# Patient Record
Sex: Male | Born: 1935 | Race: White | Hispanic: No | Marital: Married | Smoking: Never smoker
Health system: Southern US, Community
[De-identification: ages and names within clinical notes are randomized; demographics above are authoritative.]

## PROBLEM LIST (undated history)

## (undated) DIAGNOSIS — I509 Heart failure, unspecified: Secondary | ICD-10-CM

## (undated) DIAGNOSIS — Z95810 Presence of automatic (implantable) cardiac defibrillator: Secondary | ICD-10-CM

## (undated) DIAGNOSIS — I2510 Atherosclerotic heart disease of native coronary artery without angina pectoris: Secondary | ICD-10-CM

## (undated) DIAGNOSIS — G629 Polyneuropathy, unspecified: Secondary | ICD-10-CM

## (undated) DIAGNOSIS — I252 Old myocardial infarction: Secondary | ICD-10-CM

## (undated) DIAGNOSIS — E119 Type 2 diabetes mellitus without complications: Secondary | ICD-10-CM

## (undated) DIAGNOSIS — G2581 Restless legs syndrome: Secondary | ICD-10-CM

## (undated) HISTORY — PX: APPENDECTOMY: SHX54

## (undated) HISTORY — PX: PARTIAL KNEE ARTHROPLASTY: SHX2174

---

## 2004-05-09 ENCOUNTER — Ambulatory Visit: Payer: Self-pay | Admitting: Ophthalmology

## 2004-05-11 ENCOUNTER — Ambulatory Visit: Payer: Self-pay | Admitting: Ophthalmology

## 2006-01-31 ENCOUNTER — Ambulatory Visit: Payer: Self-pay | Admitting: Gastroenterology

## 2007-12-23 ENCOUNTER — Ambulatory Visit: Payer: Self-pay | Admitting: Internal Medicine

## 2008-01-06 ENCOUNTER — Ambulatory Visit: Payer: Self-pay | Admitting: Internal Medicine

## 2008-04-30 ENCOUNTER — Ambulatory Visit: Payer: Self-pay | Admitting: Internal Medicine

## 2008-05-08 ENCOUNTER — Ambulatory Visit: Payer: Self-pay | Admitting: Internal Medicine

## 2008-10-21 ENCOUNTER — Ambulatory Visit: Payer: Self-pay | Admitting: Urology

## 2020-01-01 DIAGNOSIS — I252 Old myocardial infarction: Secondary | ICD-10-CM

## 2020-01-01 DIAGNOSIS — Z95810 Presence of automatic (implantable) cardiac defibrillator: Secondary | ICD-10-CM

## 2020-01-01 HISTORY — DX: Presence of automatic (implantable) cardiac defibrillator: Z95.810

## 2020-01-01 HISTORY — PX: CARDIAC DEFIBRILLATOR PLACEMENT: SHX171

## 2020-01-01 HISTORY — DX: Old myocardial infarction: I25.2

## 2020-07-06 DIAGNOSIS — I4891 Unspecified atrial fibrillation: Secondary | ICD-10-CM | POA: Insufficient documentation

## 2020-07-20 DIAGNOSIS — E782 Mixed hyperlipidemia: Secondary | ICD-10-CM | POA: Diagnosis present

## 2020-07-20 DIAGNOSIS — I6523 Occlusion and stenosis of bilateral carotid arteries: Secondary | ICD-10-CM | POA: Insufficient documentation

## 2020-07-20 DIAGNOSIS — I2510 Atherosclerotic heart disease of native coronary artery without angina pectoris: Secondary | ICD-10-CM | POA: Diagnosis present

## 2020-07-20 DIAGNOSIS — I5022 Chronic systolic (congestive) heart failure: Secondary | ICD-10-CM | POA: Diagnosis present

## 2020-08-17 ENCOUNTER — Other Ambulatory Visit: Payer: Self-pay

## 2020-08-17 ENCOUNTER — Encounter: Payer: Self-pay | Admitting: Emergency Medicine

## 2020-08-17 ENCOUNTER — Ambulatory Visit
Admission: EM | Admit: 2020-08-17 | Discharge: 2020-08-17 | Disposition: A | Discharge status: Home / Self Care | Payer: Medicare Other | Attending: Family Medicine | Admitting: Family Medicine

## 2020-08-17 DIAGNOSIS — L0231 Cutaneous abscess of buttock: Secondary | ICD-10-CM | POA: Diagnosis not present

## 2020-08-17 DIAGNOSIS — L03317 Cellulitis of buttock: Secondary | ICD-10-CM | POA: Diagnosis not present

## 2020-08-17 MED ORDER — DOXYCYCLINE HYCLATE 100 MG PO CAPS: 100.0000 mg | ORAL_CAPSULE | Freq: Two times a day (BID) | ORAL | 0 refills | Status: DC

## 2020-08-17 MED ORDER — MUPIROCIN 2 % EX OINT: 1.0000 "application " | TOPICAL_OINTMENT | Freq: Three times a day (TID) | CUTANEOUS | 0 refills | Status: AC

## 2020-08-17 NOTE — Discharge Instructions (Signed)
Warm soaks.  Medication as prescribed.  Take care  Dr. Adriana Simas

## 2020-08-17 NOTE — ED Provider Notes (Signed)
[facility]    CSN: [id number] Arrival date & time: 08/17/20  1403      History   Chief Complaint Chief Complaint  Patient presents with  . Abscess   HPI  87 year old male presents with an abscess on his buttock.  This is an ongoing issue.  It has been there for a few months.  Area is currently draining.  He states that it is painful.  His daughter-in-law has been applying topical agents to heal the area without resolution.  No fever.  No other associated symptoms.  No other complaints.  Home Medications    Prior to Admission medications   Medication Sig Start Date End Date Taking? Authorizing Provider  acetaminophen (TYLENOL) 500 MG tablet Take by mouth.   Yes [provider]  aspirin 81 MG EC tablet Take by mouth.   Yes [provider]  doxycycline (VIBRAMYCIN) 100 MG capsule Take 1 capsule (100 mg total) by mouth 2 (two) times daily. 08/17/20  Yes Tommie Sams, DO  finasteride (PROSCAR) 5 MG tablet Take by mouth. 06/23/20 06/23/21 Yes [provider]  gabapentin (NEURONTIN) 300 MG capsule TAKE 1 CAPSULE BY MOUTH TWICE DAILY, MORNING AND NIGHT 07/06/20  Yes [provider]  latanoprost (XALATAN) 0.005 % ophthalmic solution 1 drop nightly.   Yes [provider]  metFORMIN (GLUCOPHAGE) 500 MG tablet Take by mouth.   Yes [provider]  mupirocin ointment (BACTROBAN) 2 % Apply 1 application topically 3 (three) times daily for 7 days. 08/17/20 08/24/20 Yes Gianna Calef G, DO  nitrofurantoin, macrocrystal-monohydrate, (MACROBID) 100 MG capsule Take by mouth. 08/13/20 08/18/20 Yes [provider]  sacubitril-valsartan (ENTRESTO) 24-26 MG Take by mouth. 07/19/20 09/08/20 Yes [provider]  tamsulosin (FLOMAX) 0.4 MG CAPS capsule Take by mouth.   Yes [provider]    Family History No family history on file.  Social History Social History   Tobacco Use  . Smoking status: Never Smoker  .  Smokeless tobacco: Never Used  Vaping Use  . Vaping Use: Never used  Substance Use Topics  . Alcohol use: Not Currently  . Drug use: Not Currently     Allergies   Patient has no known allergies.   Review of Systems Review of Systems  Constitutional: Negative for fever.  Skin:       Abscess.     Physical Exam Triage Vital Signs ED Triage Vitals  Enc Vitals Group     BP 08/17/20 1423 108/68     Pulse Rate 08/17/20 1423 72     Resp 08/17/20 1423 18     Temp 08/17/20 1423 (!) 97.5 F (36.4 C)     Temp Source 08/17/20 1423 Oral     SpO2 08/17/20 1423 90 %     Weight 08/17/20 1419 170 lb (77.1 kg)     Height 08/17/20 1419 6' (1.829 m)     Head Circumference --      Peak Flow --      Pain Score 08/17/20 1419 5     Pain Loc --      Pain Edu? --      Excl. in GC? --    Updated Vital Signs BP 108/68 (BP Location: Left Arm)   Pulse 72   Temp (!) 97.5 F (36.4 C) (Oral)   Resp 18   Ht 6' (1.829 m)   Wt 77.1 kg   SpO2 90%   BMI 23.06 kg/m   Visual Acuity Right  Eye Distance:   Left Eye Distance:   Bilateral Distance:    Right Eye Near:   Left Eye Near:    Bilateral Near:     Physical Exam Vitals and nursing note reviewed.  Constitutional:      General: He is not in acute distress.    Appearance: Normal appearance. He is not ill-appearing.  HENT:     Head: Normocephalic and atraumatic.  Pulmonary:     Effort: Pulmonary effort is normal. No respiratory distress.  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  No significant fluctuance.  Neurological:     Mental Status: He is alert.  Psychiatric:        Mood and Affect: Mood normal.        Behavior: Behavior normal.    UC Treatments / Results  Labs (all labs ordered are listed, but only abnormal results are displayed) Labs Reviewed - No data to display  EKG   Radiology No results found.  Procedures Procedures (including critical care time)  Medications Ordered in  UC Medications - No data to display  Initial Impression / Assessment and Plan / UC Course  I have reviewed the triage vital signs and the nursing notes.  Pertinent labs & imaging results that were available during my care of the patient were reviewed by me and considered in my medical decision making (see chart for details).    58-year-old male presents with cellulitis and abscess.  The area is not significantly fluctuant.  I discussed incision and drainage but we elected to not proceed as I do not think it would be very fruitful.  Placed on doxycycline.  Bactroban ointment as prescribed.  Supportive care.  Final Clinical Impressions(s) / UC Diagnoses   Final diagnoses:  Abscess and cellulitis of gluteal region     Discharge Instructions     Warm soaks.  Medication as prescribed.  Take care  Dr. Adriana Simas    ED Prescriptions    Medication Sig Dispense Auth. Provider   doxycycline (VIBRAMYCIN) 100 MG capsule Take 1 capsule (100 mg total) by mouth 2 (two) times daily. 14 capsule Tatijana Bierly G, DO   mupirocin ointment (BACTROBAN) 2 % Apply 1 application topically 3 (three) times daily for 7 days. 30 g Tommie Sams, DO     PDMP not reviewed this encounter.   Tommie Sams, [place] 08/17/20 [place]

## 2020-08-17 NOTE — ED Triage Notes (Signed)
Pt states he has a abscess on his buttock. Has been there for months but daughter in law  Just notice it was infected last night.

## 2020-08-22 ENCOUNTER — Observation Stay
Admission: EM | Admit: 2020-08-22 | Discharge: 2020-08-24 | Disposition: A | Discharge status: Home / Self Care | Payer: Medicare Other | Attending: Internal Medicine | Admitting: Internal Medicine

## 2020-08-22 ENCOUNTER — Other Ambulatory Visit: Payer: Self-pay

## 2020-08-22 DIAGNOSIS — L89159 Pressure ulcer of sacral region, unspecified stage: Secondary | ICD-10-CM | POA: Diagnosis not present

## 2020-08-22 DIAGNOSIS — N400 Enlarged prostate without lower urinary tract symptoms: Secondary | ICD-10-CM

## 2020-08-22 DIAGNOSIS — Z20822 Contact with and (suspected) exposure to covid-19: Secondary | ICD-10-CM | POA: Insufficient documentation

## 2020-08-22 DIAGNOSIS — I2510 Atherosclerotic heart disease of native coronary artery without angina pectoris: Secondary | ICD-10-CM | POA: Diagnosis not present

## 2020-08-22 DIAGNOSIS — Z7984 Long term (current) use of oral hypoglycemic drugs: Secondary | ICD-10-CM | POA: Diagnosis not present

## 2020-08-22 DIAGNOSIS — E119 Type 2 diabetes mellitus without complications: Secondary | ICD-10-CM | POA: Diagnosis not present

## 2020-08-22 DIAGNOSIS — R55 Syncope and collapse: Secondary | ICD-10-CM | POA: Insufficient documentation

## 2020-08-22 DIAGNOSIS — E869 Volume depletion, unspecified: Secondary | ICD-10-CM | POA: Diagnosis not present

## 2020-08-22 DIAGNOSIS — Z7982 Long term (current) use of aspirin: Secondary | ICD-10-CM | POA: Insufficient documentation

## 2020-08-22 DIAGNOSIS — R748 Abnormal levels of other serum enzymes: Secondary | ICD-10-CM | POA: Insufficient documentation

## 2020-08-22 DIAGNOSIS — I5022 Chronic systolic (congestive) heart failure: Secondary | ICD-10-CM | POA: Diagnosis present

## 2020-08-22 DIAGNOSIS — I6529 Occlusion and stenosis of unspecified carotid artery: Secondary | ICD-10-CM

## 2020-08-22 DIAGNOSIS — Z95810 Presence of automatic (implantable) cardiac defibrillator: Secondary | ICD-10-CM

## 2020-08-22 DIAGNOSIS — S31000A Unspecified open wound of lower back and pelvis without penetration into retroperitoneum, initial encounter: Secondary | ICD-10-CM

## 2020-08-22 DIAGNOSIS — R778 Other specified abnormalities of plasma proteins: Secondary | ICD-10-CM | POA: Diagnosis present

## 2020-08-22 DIAGNOSIS — R339 Retention of urine, unspecified: Secondary | ICD-10-CM | POA: Diagnosis not present

## 2020-08-22 DIAGNOSIS — R42 Dizziness and giddiness: Principal | ICD-10-CM | POA: Insufficient documentation

## 2020-08-22 DIAGNOSIS — R531 Weakness: Secondary | ICD-10-CM | POA: Diagnosis present

## 2020-08-22 DIAGNOSIS — Z978 Presence of other specified devices: Secondary | ICD-10-CM

## 2020-08-22 DIAGNOSIS — E782 Mixed hyperlipidemia: Secondary | ICD-10-CM | POA: Diagnosis present

## 2020-08-22 HISTORY — DX: Type 2 diabetes mellitus without complications: E11.9

## 2020-08-22 HISTORY — DX: Heart failure, unspecified: I50.9

## 2020-08-22 HISTORY — DX: Old myocardial infarction: I25.2

## 2020-08-22 HISTORY — DX: Restless legs syndrome: G25.81

## 2020-08-22 HISTORY — DX: Presence of automatic (implantable) cardiac defibrillator: Z95.810

## 2020-08-22 HISTORY — DX: Polyneuropathy, unspecified: G62.9

## 2020-08-22 HISTORY — DX: Atherosclerotic heart disease of native coronary artery without angina pectoris: I25.10

## 2020-08-22 LAB — CBC WITH DIFFERENTIAL/PLATELET
Abs Immature Granulocytes: 0.08 10*3/uL — ABNORMAL HIGH (ref 0.00–0.07)
Basophils Absolute: 0.1 10*3/uL (ref 0.0–0.1)
Basophils Relative: 1 %
Eosinophils Absolute: 0.4 10*3/uL (ref 0.0–0.5)
Eosinophils Relative: 4 %
HCT: 37.4 % — ABNORMAL LOW (ref 39.0–52.0)
Hemoglobin: 11.9 g/dL — ABNORMAL LOW (ref 13.0–17.0)
Immature Granulocytes: 1 %
Lymphocytes Relative: 22 %
Lymphs Abs: 2.2 10*3/uL (ref 0.7–4.0)
MCH: 33.5 pg (ref 26.0–34.0)
MCHC: 31.8 g/dL (ref 30.0–36.0)
MCV: 105.4 fL — ABNORMAL HIGH (ref 80.0–100.0)
Monocytes Absolute: 1 10*3/uL (ref 0.1–1.0)
Monocytes Relative: 11 %
Neutro Abs: 5.9 10*3/uL (ref 1.7–7.7)
Neutrophils Relative %: 61 %
Platelets: 183 10*3/uL (ref 150–400)
RBC: 3.55 MIL/uL — ABNORMAL LOW (ref 4.22–5.81)
RDW: 13 % (ref 11.5–15.5)
WBC: 9.7 10*3/uL (ref 4.0–10.5)
nRBC: 0 % (ref 0.0–0.2)

## 2020-08-22 MED ORDER — SODIUM CHLORIDE 0.9 % IV BOLUS
500.0000 mL | Freq: Once | INTRAVENOUS | Status: AC
  Administered 2020-08-22: 500 mL via INTRAVENOUS

## 2020-08-22 NOTE — ED Triage Notes (Signed)
Pt presents to the [facility] via EMS from home with c/o generalized weakness and dizziness. Pt states that he began feeling weak and dizzy while bearing down to have a BM. Pt states that symptoms lasted for a short time before resolving. Pt currently A&Ox4 and denies any complaints at this time.

## 2020-08-23 ENCOUNTER — Inpatient Hospital Stay: Payer: Medicare Other

## 2020-08-23 ENCOUNTER — Inpatient Hospital Stay
Admit: 2020-08-23 | Discharge: 2020-08-23 | Disposition: A | Discharge status: Home / Self Care | Payer: Medicare Other | Attending: Internal Medicine | Admitting: Internal Medicine

## 2020-08-23 ENCOUNTER — Encounter: Payer: Self-pay | Admitting: Emergency Medicine

## 2020-08-23 DIAGNOSIS — E119 Type 2 diabetes mellitus without complications: Secondary | ICD-10-CM

## 2020-08-23 DIAGNOSIS — R55 Syncope and collapse: Secondary | ICD-10-CM | POA: Diagnosis not present

## 2020-08-23 DIAGNOSIS — R778 Other specified abnormalities of plasma proteins: Secondary | ICD-10-CM | POA: Diagnosis present

## 2020-08-23 DIAGNOSIS — L89159 Pressure ulcer of sacral region, unspecified stage: Secondary | ICD-10-CM

## 2020-08-23 DIAGNOSIS — R42 Dizziness and giddiness: Principal | ICD-10-CM

## 2020-08-23 DIAGNOSIS — I6529 Occlusion and stenosis of unspecified carotid artery: Secondary | ICD-10-CM

## 2020-08-23 DIAGNOSIS — N400 Enlarged prostate without lower urinary tract symptoms: Secondary | ICD-10-CM

## 2020-08-23 DIAGNOSIS — Z95810 Presence of automatic (implantable) cardiac defibrillator: Secondary | ICD-10-CM

## 2020-08-23 LAB — ECHOCARDIOGRAM COMPLETE
AR max vel: 2.27 cm2
AV Area VTI: 2.29 cm2
AV Area mean vel: 2.35 cm2
AV Mean grad: 6 mmHg
AV Peak grad: 11.3 mmHg
Ao pk vel: 1.68 m/s
Area-P 1/2: 3.11 cm2
Height: 72 in
MV VTI: 2.27 cm2
P 1/2 time: 590 msec
S' Lateral: 3.5 cm
Weight: 2720 oz

## 2020-08-23 LAB — CBC
HCT: 36 % — ABNORMAL LOW (ref 39.0–52.0)
Hemoglobin: 11.7 g/dL — ABNORMAL LOW (ref 13.0–17.0)
MCH: 33.9 pg (ref 26.0–34.0)
MCHC: 32.5 g/dL (ref 30.0–36.0)
MCV: 104.3 fL — ABNORMAL HIGH (ref 80.0–100.0)
Platelets: 180 10*3/uL (ref 150–400)
RBC: 3.45 MIL/uL — ABNORMAL LOW (ref 4.22–5.81)
RDW: 13.1 % (ref 11.5–15.5)
WBC: 10.1 10*3/uL (ref 4.0–10.5)
nRBC: 0 % (ref 0.0–0.2)

## 2020-08-23 LAB — BASIC METABOLIC PANEL
Anion gap: 7 (ref 5–15)
Anion gap: 8 (ref 5–15)
BUN: 36 mg/dL — ABNORMAL HIGH (ref 8–23)
BUN: 41 mg/dL — ABNORMAL HIGH (ref 8–23)
CO2: 22 mmol/L (ref 22–32)
CO2: 23 mmol/L (ref 22–32)
Calcium: 8.7 mg/dL — ABNORMAL LOW (ref 8.9–10.3)
Calcium: 8.9 mg/dL (ref 8.9–10.3)
Chloride: 107 mmol/L (ref 98–111)
Chloride: 108 mmol/L (ref 98–111)
Creatinine, Ser: 1.22 mg/dL (ref 0.61–1.24)
Creatinine, Ser: 1.48 mg/dL — ABNORMAL HIGH (ref 0.61–1.24)
GFR, Estimated: 45 mL/min — ABNORMAL LOW (ref >60)
GFR, Estimated: 56 mL/min — ABNORMAL LOW (ref >60)
Glucose, Bld: 146 mg/dL — ABNORMAL HIGH (ref 70–99)
Glucose, Bld: 176 mg/dL — ABNORMAL HIGH (ref 70–99)
Potassium: 4.5 mmol/L (ref 3.5–5.1)
Potassium: 4.9 mmol/L (ref 3.5–5.1)
Sodium: 137 mmol/L (ref 135–145)
Sodium: 138 mmol/L (ref 135–145)

## 2020-08-23 LAB — URINALYSIS, COMPLETE (UACMP) WITH MICROSCOPIC
Bacteria, UA: NONE SEEN
Bilirubin Urine: NEGATIVE
Glucose, UA: NEGATIVE mg/dL
Hgb urine dipstick: NEGATIVE
Ketones, ur: NEGATIVE mg/dL
Leukocytes,Ua: NEGATIVE
Nitrite: NEGATIVE
Protein, ur: NEGATIVE mg/dL
Specific Gravity, Urine: 1.016 (ref 1.005–1.030)
Squamous Epithelial / HPF: NONE SEEN (ref 0–5)
pH: 5 (ref 5.0–8.0)

## 2020-08-23 LAB — HEMOGLOBIN A1C
Hgb A1c MFr Bld: 7.6 % — ABNORMAL HIGH (ref 4.8–5.6)
Mean Plasma Glucose: 171.42 mg/dL

## 2020-08-23 LAB — CREATININE, SERUM
Creatinine, Ser: 1.37 mg/dL — ABNORMAL HIGH (ref 0.61–1.24)
GFR, Estimated: 49 mL/min — ABNORMAL LOW (ref >60)

## 2020-08-23 LAB — CBG MONITORING, ED
Glucose-Capillary: 140 mg/dL — ABNORMAL HIGH (ref 70–99)
Glucose-Capillary: 105 mg/dL — ABNORMAL HIGH (ref 70–99)

## 2020-08-23 LAB — TROPONIN I (HIGH SENSITIVITY)
Troponin I (High Sensitivity): 113 ng/L (ref <18)
Troponin I (High Sensitivity): 141 ng/L (ref <18)
Troponin I (High Sensitivity): 76 ng/L — ABNORMAL HIGH (ref <18)

## 2020-08-23 LAB — GLUCOSE, CAPILLARY
Glucose-Capillary: 170 mg/dL — ABNORMAL HIGH (ref 70–99)
Glucose-Capillary: 105 mg/dL — ABNORMAL HIGH (ref 70–99)

## 2020-08-23 LAB — RESP PANEL BY RT-PCR (FLU A&B, COVID) ARPGX2
Influenza A by PCR: NEGATIVE
Influenza B by PCR: NEGATIVE
SARS Coronavirus 2 by RT PCR: NEGATIVE

## 2020-08-23 LAB — MAGNESIUM
Magnesium: 1.9 mg/dL (ref 1.7–2.4)
Magnesium: 2 mg/dL (ref 1.7–2.4)

## 2020-08-23 LAB — BRAIN NATRIURETIC PEPTIDE: B Natriuretic Peptide: 533 pg/mL — ABNORMAL HIGH (ref 0.0–100.0)

## 2020-08-23 MED ORDER — SODIUM CHLORIDE 0.9 % IV SOLN: INTRAVENOUS | Status: DC

## 2020-08-23 MED ORDER — SODIUM CHLORIDE 0.9% FLUSH
3.0000 mL | Freq: Two times a day (BID) | INTRAVENOUS | Status: DC
  Administered 2020-08-23 – 2020-08-24 (×2): 3 mL via INTRAVENOUS

## 2020-08-23 MED ORDER — ACETAMINOPHEN 325 MG PO TABS: 650.0000 mg | ORAL_TABLET | Freq: Four times a day (QID) | ORAL | Status: DC | PRN

## 2020-08-23 MED ORDER — ONDANSETRON HCL 4 MG/2ML IJ SOLN: 4.0000 mg | Freq: Four times a day (QID) | INTRAMUSCULAR | Status: DC | PRN

## 2020-08-23 MED ORDER — ENOXAPARIN SODIUM 40 MG/0.4ML ~~LOC~~ SOLN
40.0000 mg | SUBCUTANEOUS | Status: DC
  Administered 2020-08-23 – 2020-08-24 (×2): 40 mg via SUBCUTANEOUS
  Filled 2020-08-23 (×2): qty 0.4

## 2020-08-23 MED ORDER — INSULIN ASPART 100 UNIT/ML ~~LOC~~ SOLN: 0.0000 [IU] | Freq: Every day | SUBCUTANEOUS | Status: DC

## 2020-08-23 MED ORDER — ONDANSETRON HCL 4 MG PO TABS: 4.0000 mg | ORAL_TABLET | Freq: Four times a day (QID) | ORAL | Status: DC | PRN

## 2020-08-23 MED ORDER — ACETAMINOPHEN 650 MG RE SUPP: 650.0000 mg | Freq: Four times a day (QID) | RECTAL | Status: DC | PRN

## 2020-08-23 MED ORDER — ASPIRIN 81 MG PO CHEW
324.0000 mg | CHEWABLE_TABLET | Freq: Once | ORAL | Status: AC
  Administered 2020-08-23: 324 mg via ORAL
  Filled 2020-08-23: qty 4

## 2020-08-23 MED ORDER — INSULIN ASPART 100 UNIT/ML ~~LOC~~ SOLN
0.0000 [IU] | Freq: Three times a day (TID) | SUBCUTANEOUS | Status: DC
  Administered 2020-08-23: 1 [IU] via SUBCUTANEOUS
  Administered 2020-08-23: 2 [IU] via SUBCUTANEOUS
  Filled 2020-08-23 (×2): qty 1

## 2020-08-23 NOTE — ED Notes (Signed)
Pt given phone to talk with son.

## 2020-08-23 NOTE — ED Notes (Signed)
Pt given warm blanket.

## 2020-08-23 NOTE — Progress Notes (Signed)
*  PRELIMINARY RESULTS* Echocardiogram 2D Echocardiogram has been performed.  [name] 08/23/2020, 2:34 PM

## 2020-08-23 NOTE — ED Notes (Signed)
Messaged floor rn via secure chat for bed assignment awaiting her response

## 2020-08-23 NOTE — ED Notes (Signed)
Pt given meal tray.

## 2020-08-23 NOTE — H&P (Signed)
History and Physical    [name]:[id number] DOB: [birth year] DOA: 08/22/2020  PCP: Patient, No Pcp Per   Patient coming from: Home  I have personally briefly reviewed patient's old medical records in [facility]  Chief Complaint: Weakness  HPI: [name] is a 87 y.o. male recently relocated from [place] with medical history significant for CAD, systolic heart failure status post AICD, HLD, DM, BPH with urinary retention with chronic indwelling Foley and carotid artery stenosis who presented to the emergency room with an episode of generalized weakness that occurred while bearing down to have a bowel movement.  The weakness was transient however his daughter-in-law who is a nurse checked his vital signs several hours later and found that his blood pressure was low and he seemed to be having some extra beats and recommended he be checked in the emergency room.  Patient at the present time feels like his usual self.  He denies chest pain, shortness of breath, palpitations or lightheadedness.  Denies nausea, vomiting or diaphoresis and denies abdominal pain or diarrhea.  Denies cough fever or chills.  ED Course: On arrival, afebrile, BP 90/60, pulse 78 O2 sat 94% on room air.  Blood work significant for hemoglobin of 11.9, creatinine of 1.48, baseline unknown.  Troponins with uptrend 76>113. EKG as reviewed by me : Sinus versus ectopic atrial rhythm with multiple PVCs and right BBB and nonspecific ST-T wave changes Imaging: Chest x-ray pending  Review of Systems: As per HPI otherwise all other systems on review of systems negative.    Past Medical History:  Diagnosis Date  . CAD (coronary artery disease)   . Cardiac defibrillator in place   . CHF (congestive heart failure) (HCC)   . Diabetes mellitus without complication (HCC)   . History of myocardial infarction   . Peripheral neuropathy   . Restless leg syndrome     Past Surgical History:  Procedure Laterality  Date  . APPENDECTOMY    . CARDIAC DEFIBRILLATOR PLACEMENT    . PARTIAL KNEE ARTHROPLASTY Left      reports that he has never smoked. He has never used smokeless tobacco. He reports previous alcohol use. He reports previous drug use.  No Known Allergies  History reviewed. No pertinent family history.    Prior to Admission medications   Medication Sig Start Date End Date Taking? Authorizing Provider  acetaminophen (TYLENOL) 500 MG tablet Take by mouth.    [provider]  aspirin 81 MG EC tablet Take by mouth.    [provider]  doxycycline (VIBRAMYCIN) 100 MG capsule Take 1 capsule (100 mg total) by mouth 2 (two) times daily. 08/17/20   Tommie Sams, DO  finasteride (PROSCAR) 5 MG tablet Take by mouth. 06/23/20 06/23/21  [provider]  gabapentin (NEURONTIN) 300 MG capsule TAKE 1 CAPSULE BY MOUTH TWICE DAILY, MORNING AND NIGHT 07/06/20   [provider]  latanoprost (XALATAN) 0.005 % ophthalmic solution 1 drop nightly.    [provider]  metFORMIN (GLUCOPHAGE) 500 MG tablet Take by mouth.    [provider]  mupirocin ointment (BACTROBAN) 2 % Apply 1 application topically 3 (three) times daily for 7 days. 08/17/20 08/24/20  Tommie Sams, DO  sacubitril-valsartan (ENTRESTO) 24-26 MG Take by mouth. 07/19/20 09/08/20  [provider]  tamsulosin (FLOMAX) 0.4 MG CAPS capsule Take by mouth.    [provider]    Physical Exam: Vitals:   08/23/20 7672 08/23/20 0150 08/23/20 0153  08/23/20 0235  BP: (!) 89/51 100/70 109/71 97/62  Pulse: 64 76 81 (!) 52  Resp: 17 (!) 25 18 15   Temp:      TempSrc:      SpO2: 97% 99% 95% 96%  Weight:      Height:         Vitals:   08/23/20 0148 08/23/20 0150 08/23/20 0153 08/23/20 0235  BP: (!) 89/51 100/70 109/71 97/62  Pulse: 64 76 81 (!) 52  Resp: 17 (!) 25 18 15   Temp:      TempSrc:      SpO2: 97% 99% 95% 96%  Weight:      Height:          Constitutional: Alert and  oriented x 3 . Not in any apparent distress HEENT:      Head: Normocephalic and atraumatic.         Eyes: PERLA, EOMI, Conjunctivae are normal. Sclera is non-icteric.       Mouth/Throat: Mucous membranes are moist.       Neck: Supple with no signs of meningismus. Cardiovascular: Regular rate and rhythm. No murmurs, gallops, or rubs. 2+ symmetrical distal pulses are present . No JVD. No LE edema Respiratory: Respiratory effort normal .Lungs sounds clear bilaterally. No wheezes, crackles, or rhonchi.  Gastrointestinal: Soft, non tender, and non distended with positive bowel sounds.  Genitourinary: No CVA tenderness. Musculoskeletal: Nontender with normal range of motion in all extremities. No cyanosis, or erythema of extremities. Neurologic:  Face is symmetric. Moving all extremities. No gross focal neurologic deficits . Skin:  Sacral wound with surrounding erythema psychiatric: Mood and affect are normal    Labs on Admission: I have personally reviewed following labs and imaging studies  CBC: Recent Labs  Lab 08/22/20 2345  WBC 9.7  NEUTROABS 5.9  HGB 11.9*  HCT 37.4*  MCV 105.4*  PLT 183   Basic Metabolic Panel: Recent Labs  Lab 08/22/20 2345  NA 138  K 4.5  CL 108  CO2 22  GLUCOSE 176*  BUN 41*  CREATININE 1.48*  CALCIUM 8.7*  MG 1.9   GFR: Estimated Creatinine Clearance: 36.2 mL/min (A) (by C-G formula based on SCr of 1.48 mg/dL (H)). Liver Function Tests: No results for input(s): AST, ALT, ALKPHOS, BILITOT, PROT, ALBUMIN in the last 168 hours. No results for input(s): LIPASE, AMYLASE in the last 168 hours. No results for input(s): AMMONIA in the last 168 hours. Coagulation Profile: No results for input(s): INR, PROTIME in the last 168 hours. Cardiac Enzymes: No results for input(s): CKTOTAL, CKMB, CKMBINDEX, TROPONINI in the last 168 hours. BNP (last 3 results) No results for input(s): PROBNP in the last 8760 hours. HbA1C: No results for input(s): HGBA1C in  the last 72 hours. CBG: No results for input(s): GLUCAP in the last 168 hours. Lipid Profile: No results for input(s): CHOL, HDL, LDLCALC, TRIG, CHOLHDL, LDLDIRECT in the last 72 hours. Thyroid Function Tests: No results for input(s): TSH, T4TOTAL, FREET4, T3FREE, THYROIDAB in the last 72 hours. Anemia Panel: No results for input(s): VITAMINB12, FOLATE, FERRITIN, TIBC, IRON, RETICCTPCT in the last 72 hours. Urine analysis: No results found for: COLORURINE, APPEARANCEUR, LABSPEC, PHURINE, GLUCOSEU, HGBUR, BILIRUBINUR, KETONESUR, PROTEINUR, UROBILINOGEN, NITRITE, LEUKOCYTESUR  Radiological Exams on Admission: No results found.   Assessment/Plan 87 year old male with history of CAD, systolic heart failure status post AICD, HLD, DM, BPH with urinary retention with chronic indwelling Foley and carotid artery stenosis presenting with an episode of generalized weakness that occurred  while bearing down to have a bowel movement.    Hypotension Postural dizziness with presyncope -Suspect orthostatic hypotension given low BP on arrival, possibly vasovagal -Gentle IV hydration as patient does have systolic heart failure -Monitor blood pressure    Elevated troponin -Troponin uptrending 76-113 -ED provider spoke with patient's cardiologist, Dr. Gwen Pounds who would like to continue to monitor, does not think it is NSTEMI at this time -Could be demand related to acute hypertensive episode above    Chronic systolic CHF (congestive heart failure), NYHA class 3 (HCC)   AICD (automatic cardioverter/defibrillator) present -Patient appears euvolemic -Will hold beta-blockers, Entresto given hypotension    Coronary artery disease involving native coronary artery of native heart   Hyperlipidemia, mixed -Continue aspirin and statin    Diabetes mellitus without complication (HCC) -Sliding scale insulin coverage    BPH (benign prostatic hyperplasia) Chronic urinary retention with indwelling  Foley -Check UA to evaluate for UTI -Continue prostate meds pending med rec    Carotid stenosis -Can consider carotid Doppler  Sacral decubitus ulcer -Wound care evaluation    DVT prophylaxis: Lovenox  Code Status: full code but does not want prolonged ventilator to sustain life  Family Communication:  none  Disposition Plan: Back to previous home environment Consults called: Cardiology Status:The patient will observe these symptoms, and report promptly any worsening or unexpected persistence.  If well, may return prn.     Andris Baumann MD [facility]     08/23/2020, 4:06 AM

## 2020-08-23 NOTE — Care Management Obs Status (Signed)
MEDICARE OBSERVATION STATUS NOTIFICATION   Patient Details  Name: [name] MRN: [id number] Date of Birth: 1929-05-10   Medicare Observation Status Notification Given:  Yes    Sumire Halbleib A Adrea Sherpa, LCSW 08/23/2020, 3:53 PM

## 2020-08-23 NOTE — ED Notes (Signed)
[place] at bedside. Pt pulled up in bed and repositioned at this time.

## 2020-08-23 NOTE — Care Management CC44 (Signed)
Condition Code 44 Documentation Completed  Patient Details  Name: NEVIN GRIZZLE MRN: [id number] Date of Birth: 06-Jul-1928   Condition Code 44 given:  Yes Patient signature on Condition Code 44 notice:  Yes Documentation of 2 MD's agreement:  Yes Code 44 added to claim:  Yes    Reuel Boom Tenya Araque, LCSW 08/23/2020, 3:53 PM

## 2020-08-23 NOTE — Evaluation (Signed)
Physical Therapy Evaluation Patient Details Name: [name] MRN: [id number] DOB: 1928/08/21 Today's Date: 08/23/2020   History of Present Illness  Pt admitted to [facility] on 08/22/20 for c/o generalized weakness that occurred while bearing down to have BM. Weakness was transient. DIL is RN and checked vitals to note that BP was low and that the pt HR seemed to have "extra beats." Pt with recent ED admission for chronic infected sacral decubitus ulcer. Significant PMH includes: CAD, sCHF post AICD, HLD, DM, BPH with urinary retention and chronic indwelling catheter, carotid artery stenosis. Carotid US concerning for extensive atheromatous plaque of the distal common internal carotid arteries. Elevated tropnin likely due to demand ischemia.    Clinical Impression  Pt is a 87 year old M admitted to hospital on 08/22/20 for generalized weakness. At baseline, pt is mod I for community ambulation with RW and ADL's with exception of pt needing assistance for drying off and donning clothes after bathing. Family assists with IADL's. Pt presents with adequate strength, balance, endurance, and safety awareness required for safe functional mobility. Pt was mod I for bed mobility and required supervision for safety for transfers and ambulation. 4-item DGI score of 8/12 indicates pt is at increased risk of falls (>/= 11/12 indicates low risk); however, score is likely due to the fact that pt required use of RW for mobility. Increased time required during session to measure orthostatics; see below for details. Pt is currently functioning at baseline and is therefore not an appropriate candidate for skilled acute PT needs at this time; please re-consult with any changes in status. Pt appropriate to ambulate with nursing and mobility tech while hospitalized for maintenance of Ind. At this time, PT recommends DC home with no further therapy needs.      Follow Up Recommendations No PT follow up    Equipment  Recommendations       Recommendations for Other Services       Precautions / Restrictions Precautions Precautions: Fall Restrictions Weight Bearing Restrictions: No      Mobility  Bed Mobility Overal bed mobility: Modified Independent             General bed mobility comments: Mod I for supine>sit transfer with HOB slightly elevated    Transfers Overall transfer level: Needs assistance Equipment used: Rolling walker (2 wheeled) Transfers: Sit to/from Stand Sit to Stand: Supervision         General transfer comment: Supervision for safety to perform STS with RW  Ambulation/Gait Ambulation/Gait assistance: Supervision Gait Distance (Feet): 160 Feet Assistive device: Rolling walker (2 wheeled)       General Gait Details: Supervision for safety to ambulate with RW. Pt demonstrates reciprocal gait pattern with wide BOS. Mild gait deficits noted with 4-item DGI score with score of 8/12 as pt required use of RW.      Balance Overall balance assessment: Needs assistance Sitting-balance support: No upper extremity supported;Feet supported Sitting balance-Leahy Scale: Normal     Standing balance support: Bilateral upper extremity supported;During functional activity Standing balance-Leahy Scale: Good Standing balance comment: No LOB with 4-item DGI testing                             Pertinent Vitals/Pain Pain Assessment: 0-10 Pain Score: 6  Pain Location: decubitus ulcer Pain Intervention(s): Monitored during session;Repositioned    Home Living Family/patient expects to be discharged to:: Private residence Living Arrangements: Children;Spouse/significant other Available Help at  Discharge: Family;Available 24 hours/day Type of Home: House Home Access: Stairs to enter Entrance Stairs-Rails: None Entrance Stairs-Number of Steps: 1 Home Layout: One level Home Equipment: Walker - 4 wheels;Bedside commode;Wheelchair - Government social research officer Comments: lift chair    Prior Function Level of Independence: Needs assistance   Gait / Transfers Assistance Needed: Mod I with rollator for community ambulation  ADL's / Homemaking Assistance Needed: Mod I for ADL's, except pt notes he requires assistance drying off and donning/doffing clothes after showering; pt does not need assist when donning/doffing clothes otherwise. Son/DIL assist with IADL's.        Hand Dominance        Extremity/Trunk Assessment   Upper Extremity Assessment Upper Extremity Assessment: Overall WFL for tasks assessed    Lower Extremity Assessment Lower Extremity Assessment: Overall WFL for tasks assessed    Cervical / Trunk Assessment Cervical / Trunk Assessment: Normal  Communication   Communication: No difficulties  Cognition Arousal/Alertness: Awake/alert Behavior During Therapy: WFL for tasks assessed/performed Overall Cognitive Status: Within Functional Limits for tasks assessed                                 General Comments: Pt A&O x4 and able to follow 100% of simple 2 step commands      General Comments      Exercises Other Exercises Other Exercises: Pt able to participate in bed mobility, transfers, and gait with RW. Pt required gross supervision for safety. Pt asymptomatic during mobility. 4-item DGI score of 8/12 indicates pt is at increased risk of falls, however, likely due to fact that pt required RW for mobility. Other Exercises: Pt educated regarding: PT role/POC, DC recommendations, BP testing, amb with nursing, and safety with mobility; he verbalized understanding of all education.   Assessment/Plan    PT Assessment Patent does not need any further PT services  PT Problem List         PT Treatment Interventions      PT Goals (Current goals can be found in the Care Plan section)  Acute Rehab PT Goals Patient Stated Goal: to walk and drive PT Goal Formulation: With patient Time For  Goal Achievement: 09/06/20 Potential to Achieve Goals: Good     AM-PAC PT "6 Clicks" Mobility  Outcome Measure Help needed turning from your back to your side while in a flat bed without using bedrails?: None Help needed moving from lying on your back to sitting on the side of a flat bed without using bedrails?: None Help needed moving to and from a bed to a chair (including a wheelchair)?: A Little Help needed standing up from a chair using your arms (e.g., wheelchair or bedside chair)?: A Little Help needed to walk in hospital room?: A Little Help needed climbing 3-5 steps with a railing? : A Little 6 Click Score: 20    End of Session Equipment Utilized During Treatment: Gait belt Activity Tolerance: Patient tolerated treatment well Patient left: in chair;with call bell/phone within reach Nurse Communication: Mobility status      Time: 1093-2355 PT Time Calculation (min) (ACUTE ONLY): 26 min   Charges:   PT Evaluation $PT Eval Low Complexity: 1 Low         Vira Blanco, PT, DPT 5:29 PM,08/23/20

## 2020-08-23 NOTE — ED Provider Notes (Signed)
[facility] Emergency Department Provider Note  ____________________________________________   Event Date/Time   First MD Initiated Contact with Patient 08/22/20 2328     (approximate)  I have reviewed the triage vital signs and the nursing notes.   HISTORY  Chief Complaint Weakness    HPI [name] is a 87 y.o. male with medical history as listed below who relatively recently relocated from [place] to [place] and is still in the process of establishing care.  He sees Dr. Gwen Pounds for cardiology and Dr. Maryjane Hurter for primary care and in fact has an appoint with Dr. Gwen Pounds later this week.  He presents tonight with an episode of generalized weakness earlier in the day.  He said that around 3 PM he was trying to have a bowel movement and was bearing down when he started feel very weak all over.  He felt better afterwards in particular after he took a nap but later in the evening he told his daughter-in-law about the episode.  She is a Engineer, civil (consulting) and she checked his vital signs and heart rate and discovered that his blood pressure was little bit low and he seemed to be having "extra beats".  He has a defibrillator and has a cardiac history so they felt like he should be checked out.   He said that he feels great.  He has no weakness or fatigue.  He reports that he is quite healthy for a 88 year old.  He has a Foley catheter in place but that is not a new or acute issue.  He denies fever, sore throat, chest pain, shortness of breath, palpitations, nausea, vomiting, and abdominal pain.  He said that the episode was acute in onset and the symptoms were moderate but nothing in particular made it feel better or worse and everything got better on its own.        Past Medical History:  Diagnosis Date  . CAD (coronary artery disease)   . Cardiac defibrillator in place   . CHF (congestive heart failure) (HCC)   . Diabetes mellitus without complication  (HCC)   . History of myocardial infarction   . Peripheral neuropathy   . Restless leg syndrome     Patient Active Problem List   Diagnosis Date Noted  . Elevated troponin 08/23/2020  . Diabetes mellitus without complication (HCC) 08/23/2020  . BPH (benign prostatic hyperplasia) 08/23/2020  . Postural dizziness with presyncope 08/23/2020  . AICD (automatic cardioverter/defibrillator) present 08/23/2020  . Carotid stenosis 08/23/2020  . Chronic systolic CHF (congestive heart failure), NYHA class 3 (HCC) 07/20/2020  . Coronary artery disease involving native coronary artery of native heart 07/20/2020  . Hyperlipidemia, mixed 07/20/2020  . Atrial fibrillation (HCC) 07/06/2020    Past Surgical History:  Procedure Laterality Date  . APPENDECTOMY    . CARDIAC DEFIBRILLATOR PLACEMENT    . PARTIAL KNEE ARTHROPLASTY Left     Prior to Admission medications   Medication Sig Start Date End Date Taking? Authorizing Provider  acetaminophen (TYLENOL) 500 MG tablet Take by mouth.    [provider]  aspirin 81 MG EC tablet Take by mouth.    [provider]  doxycycline (VIBRAMYCIN) 100 MG capsule Take 1 capsule (100 mg total) by mouth 2 (two) times daily. 08/17/20   Tommie Sams, DO  finasteride (PROSCAR) 5 MG tablet Take by mouth. 06/23/20 06/23/21  [provider]  gabapentin (NEURONTIN) 300 MG capsule TAKE 1 CAPSULE BY MOUTH TWICE DAILY, MORNING AND  NIGHT 07/06/20   [provider]  latanoprost (XALATAN) 0.005 % ophthalmic solution 1 drop nightly.    [provider]  metFORMIN (GLUCOPHAGE) 500 MG tablet Take by mouth.    [provider]  mupirocin ointment (BACTROBAN) 2 % Apply 1 application topically 3 (three) times daily for 7 days. 08/17/20 08/24/20  Tommie Samsook, Jayce G, DO  sacubitril-valsartan (ENTRESTO) 24-26 MG Take by mouth. 07/19/20 09/08/20  [provider]  tamsulosin (FLOMAX) 0.4 MG CAPS capsule Take by mouth.    [provider]    Allergies Patient has no known allergies.  History reviewed. No pertinent family history.  Social History Social History   Tobacco Use  . Smoking status: Never Smoker  . Smokeless tobacco: Never Used  Vaping Use  . Vaping Use: Never used  Substance Use Topics  . Alcohol use: Not Currently  . Drug use: Not Currently    Review of Systems Constitutional: No fever/chills Eyes: No visual changes. ENT: No sore throat. Cardiovascular: Transient episode of generalized weakness while having a bowel movement.  Resolved for multiple hours.  Denies chest pain and palpitations. Respiratory: Denies shortness of breath. Gastrointestinal: No abdominal pain.  No nausea, no vomiting.  No diarrhea.  No constipation. Genitourinary: Negative for dysuria. Musculoskeletal: Negative for neck pain.  Negative for back pain. Integumentary: Negative for rash. Neurological: Negative for headaches, focal weakness or numbness.   ____________________________________________   PHYSICAL EXAM:  VITAL SIGNS: ED Triage Vitals  Enc Vitals Group     BP 08/22/20 2326 90/60     Pulse Rate 08/22/20 2326 78     Resp 08/22/20 2326 18     Temp 08/22/20 2326 98 F (36.7 C)     Temp Source 08/22/20 2326 Oral     SpO2 08/22/20 2326 94 %     Weight 08/22/20 2327 77.1 kg (170 lb)     Height 08/22/20 2327 1.829 m (6')     Head Circumference --      Peak Flow --      Pain Score 08/22/20 2327 0     Pain Loc --      Pain Edu? --      Excl. in GC? --     Constitutional: Alert and oriented.  Eyes: Conjunctivae are normal.  Head: Atraumatic. Nose: No congestion/rhinnorhea. Mouth/Throat: Patient is wearing a mask. Neck: No stridor.  No meningeal signs.   Cardiovascular: Normal rate, regular rhythm. Good peripheral circulation. Respiratory: Normal respiratory effort.  No retractions. Gastrointestinal: Soft and nontender. No distention.  Musculoskeletal: No lower extremity tenderness nor  edema. No gross deformities of extremities. Neurologic:  Normal speech and language. No gross focal neurologic deficits are appreciated.  Skin:  Skin is warm and dry.  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  There is an extensive area of erythema and a small area with what appears to be an open but chronic wound, no purulent drainage, and no fluctuance that suggests that there would be benefit to I&D.  Minimal tenderness. Psychiatric: Mood and affect are normal. Speech and behavior are normal.  ____________________________________________   LABS (all labs ordered are listed, but only abnormal results are displayed)  Labs Reviewed  CBC WITH DIFFERENTIAL/PLATELET - Abnormal; Notable for the following components:      Result Value   RBC 3.55 (*)    Hemoglobin 11.9 (*)    HCT 37.4 (*)  MCV 105.4 (*)    Abs Immature Granulocytes 0.08 (*)    All other components within normal limits  BASIC METABOLIC PANEL - Abnormal; Notable for the following components:   Glucose, Bld 176 (*)    BUN 41 (*)    Creatinine, Ser 1.48 (*)    Calcium 8.7 (*)    GFR, Estimated 45 (*)    All other components within normal limits  TROPONIN I (HIGH SENSITIVITY) - Abnormal; Notable for the following components:   Troponin I (High Sensitivity) 76 (*)    All other components within normal limits  TROPONIN I (HIGH SENSITIVITY) - Abnormal; Notable for the following components:   Troponin I (High Sensitivity) 113 (*)    All other components within normal limits  MAGNESIUM  HEMOGLOBIN A1C  CBC  CREATININE, SERUM  TROPONIN I (HIGH SENSITIVITY)   ____________________________________________  EKG  ED ECG REPORT I, Loleta Rose, the attending physician, personally viewed and interpreted this ECG.  Date: 08/22/2020 EKG Time: 23: 35 Rate: 83 Rhythm: sinus or ectopic atrial rhythm with multiple PVCs QRS Axis: normal Intervals: Right  bundle branch block ST/T Wave abnormalities: Non-specific ST segment / T-wave changes, but no clear evidence of acute ischemia. Narrative Interpretation: no definitive evidence of acute ischemia; does not meet STEMI criteria.   ____________________________________________  RADIOLOGY I, Loleta Rose, personally viewed and evaluated these images (plain radiographs) as part of my medical decision making, as well as reviewing the written report by the radiologist.  ED MD interpretation: No indication for emergent imaging  Official radiology report(s): No results found.  ____________________________________________   PROCEDURES   Procedure(s) performed (including Critical Care):  .1-3 Lead EKG Interpretation Performed by: Loleta Rose, MD Authorized by: Loleta Rose, MD     ECG rate:  80   ECG rate assessment: normal     Rhythm: sinus rhythm     Ectopy: PAC and PVCs     Conduction: normal       ____________________________________________   INITIAL IMPRESSION / MDM / ASSESSMENT AND PLAN / ED COURSE  As part of my medical decision making, I reviewed the following data within the electronic MEDICAL RECORD NUMBER History obtained from family, Nursing notes reviewed and incorporated, Labs reviewed , EKG interpreted , Old chart reviewed, Discussed with cardiologist (Dr. Gwen Pounds), Discussed with admitting physician (Dr. Para March) and reviewed Notes from prior ED visits   Differential diagnosis includes, but is not limited to, volume depletion, vasovagal episode, ACS, acute infection.  The patient is asymptomatic.  His vital signs are notable for mild hypertension but he is asymptomatic from that as well and review of his historical data suggests his blood pressure is always on the low side.  He is not having chest pain or respiratory symptoms.  He most likely had a mild vasovagal episode when he was straining to have a bowel movement and he has been asymptomatic for hours.  However  given his age and comorbidities, I will evaluate with basic lab work.  His EKG shows no evidence of acute ischemia although he does have some premature contractions.  He has not had a defibrillator shock.  Patient is very alert, shock, and agrees with the plan.  He does not want to stay in the hospital and assuming a relatively reassuring work-up I do not think he will need to stay.  His sacral wound appears chronic.  I put in an ambulatory outpatient referral to the wound care center given that this is apparently been going on  for months but it does not appear that it would benefit from incision and drainage tonight and I doubt that it is the source of his acute issue.  He has a chronic indwelling Foley catheter but is having no urinary symptoms and no infectious signs or symptoms.  Given that the Foley is chronic I will not check a urinalysis given the possibility of a false positive and going down the road of unnecessary antibiotics.  The patient is on the cardiac monitor to evaluate for evidence of arrhythmia and/or significant heart rate changes.     Clinical Course as of 08/23/20 0410  Mon Aug 23, 2020  0006 CBC with Differential/Platelet(!) Essentially normal CBC [CF]  0030 Basic metabolic panel(!) Basic metabolic panel is essentially normal.  He has a degree of chronic kidney disease based on his labs in care everywhere and the troponin tonight is not significantly elevated above baseline, just a little bit.  He received the fluid bolus which also should be helpful.  CBC is normal as is the magnesium. [CF]  0138 Troponin I (High Sensitivity)(!): 76 High-sensitivity troponin is elevated at 76.  This is difficult to interpret in the setting of some mild kidney disease and the patient being completely asymptomatic.  His blood pressure is improved to 98/81 after his 500 mL fluid bolus.  He continues to state he feels fine and completely asymptomatic.  At no point has he had chest pain.  His  son is in the room currently.  I had a detailed discussion with the 2 of them about the troponin results, the relatively low blood pressure (although looking back through the historical data it appears his blood pressure is typically on the low side), and offered admission/observation versus repeat troponin with the potential for outpatient follow-up.  The patient strongly prefers discharge and outpatient follow-up if possible.  The current plan is to encourage him to eat and drink, check orthostatic vitals, and repeated troponin at just over the 2-hour mark.  If he continues to be asymptomatic with reassuring vital signs and no significant troponin changes, he may be appropriate for discharge and close follow-up with Dr. Gwen Pounds.  Both he and his son understand that I cannot definitively rule out an emergent process and that the troponin elevation is not normal even though it does not necessarily indicate an acute issue. [CF]  0251 Troponin I (High Sensitivity)(!!): 113 Unfortunately the patient's troponin has gone up to 113.  I do not believe that this represents an acute cardiac event that requires emergent intervention but it does warrant admission.  I discussed this with the patient and his son and they understand and agree.  I am giving a full dose aspirin but will hold off on heparin given that the patient is having no concerning EKG findings and no chest pain.  Consulting the hospitalist for admission. [CF]  8921 I discussed the case by phone with Dr. Gwen Pounds since he is the patient's cardiologist and is on-call overnight for [facility].  He agreed with my plan for a full dose aspirin and hospital admission and also agreed to hold off on the heparin for now unless something changes significantly from a clinical perspective. [CF]  0406 Discussed case by phone with Dr. Para March, including Dr. Philemon Kingdom recommendations, and she understands and agrees with the plan [CF]    Clinical Course User Index [CF]  Loleta Rose, MD     ____________________________________________  FINAL CLINICAL IMPRESSION(S) / ED DIAGNOSES  Final diagnoses:  Volume depletion  Elevated troponin level  Sacral wound, initial encounter  Indwelling Foley catheter present     MEDICATIONS GIVEN DURING THIS VISIT:  Medications  sodium chloride flush (NS) 0.9 % injection 3 mL (has no administration in time range)  insulin aspart (novoLOG) injection 0-9 Units (has no administration in time range)  insulin aspart (novoLOG) injection 0-5 Units (has no administration in time range)  enoxaparin (LOVENOX) injection 40 mg (has no administration in time range)  acetaminophen (TYLENOL) tablet 650 mg (has no administration in time range)    Or  acetaminophen (TYLENOL) suppository 650 mg (has no administration in time range)  ondansetron (ZOFRAN) tablet 4 mg (has no administration in time range)    Or  ondansetron (ZOFRAN) injection 4 mg (has no administration in time range)  sodium chloride 0.9 % bolus 500 mL (0 mLs Intravenous Stopped 08/23/20 0048)  aspirin chewable tablet 324 mg (324 mg Oral Given 08/23/20 0307)     ED Discharge Orders         Ordered    AMB referral to wound care center       Comments: Long term sacral wound, may benefit from specialty care   08/23/20 0247          *Please note:  SAVA PROBY was evaluated in Emergency Department on 08/23/2020 for the symptoms described in the history of present illness. He was evaluated in the context of the global COVID-19 pandemic, which necessitated consideration that the patient might be at risk for infection with the SARS-CoV-2 virus that causes COVID-19. Institutional protocols and algorithms that pertain to the evaluation of patients at risk for COVID-19 are in a state of rapid change based on information released by regulatory bodies including the CDC and federal and state organizations. These policies and algorithms were followed during the patient's  care in the ED.  Some ED evaluations and interventions may be delayed as a result of limited staffing during and after the pandemic.*  Note:  This document was prepared using Dragon voice recognition software and may include unintentional dictation errors.   Loleta Rose, MD 08/23/20 [phone]

## 2020-08-23 NOTE — Progress Notes (Addendum)
He feels better today.  He has no complaints.  No chest pain, dizziness or shortness of breath.  He said his blood pressure normally stays on the low side but not as low as what has been recorded in the hospital.  Carotid ultrasound concerning for extensive atheromatous plaque of the distal common internal carotid arteries.  He has CKD stage IIIa and he is at increased risk for contrast-induced acute kidney injury because of advanced age.  He cannot have MRA of the neck because of CKD and ICD status.  He decided against taking further evaluation of suspected internal carotid artery stenosis.  He said he would need severe stenosis was discovered, he is not willing to undergo any surgery or intervention because of advanced age.  Elevated troponins is probably due to demand ischemia.  Monitor closely in the hospital for another day.  PT evaluation.

## 2020-08-24 DIAGNOSIS — R42 Dizziness and giddiness: Secondary | ICD-10-CM | POA: Diagnosis not present

## 2020-08-24 DIAGNOSIS — R55 Syncope and collapse: Secondary | ICD-10-CM | POA: Diagnosis not present

## 2020-08-24 LAB — GLUCOSE, CAPILLARY
Glucose-Capillary: 123 mg/dL — ABNORMAL HIGH (ref 70–99)
Glucose-Capillary: 116 mg/dL — ABNORMAL HIGH (ref 70–99)

## 2020-08-24 MED ORDER — ACETAMINOPHEN 500 MG PO TABS: 500.0000 mg | ORAL_TABLET | Freq: Four times a day (QID) | ORAL | 0 refills | Status: DC | PRN

## 2020-08-24 NOTE — Plan of Care (Signed)
Pt ready for discharge IV and tele removed Educated on discharge planning.  Time allowed for questions and concerns Verbalizes an understanding on plan of care Awaiting patient transportation home   Problem: Education: Goal: Knowledge of General Education information will improve Description: Including pain rating scale, medication(s)/side effects and non-pharmacologic comfort measures Outcome: Adequate for Discharge   Problem: Health Behavior/Discharge Planning: Goal: Ability to manage health-related needs will improve Outcome: Adequate for Discharge   Problem: Clinical Measurements: Goal: Ability to maintain clinical measurements within normal limits will improve Outcome: Adequate for Discharge Goal: Will remain free from infection Outcome: Adequate for Discharge Goal: Diagnostic test results will improve Outcome: Adequate for Discharge Goal: Respiratory complications will improve Outcome: Adequate for Discharge Goal: Cardiovascular complication will be avoided Outcome: Adequate for Discharge   Problem: Activity: Goal: Risk for activity intolerance will decrease Outcome: Adequate for Discharge   Problem: Nutrition: Goal: Adequate nutrition will be maintained Outcome: Adequate for Discharge   Problem: Coping: Goal: Level of anxiety will decrease Outcome: Adequate for Discharge   Problem: Elimination: Goal: Will not experience complications related to bowel motility Outcome: Adequate for Discharge Goal: Will not experience complications related to urinary retention Outcome: Adequate for Discharge   Problem: Pain Managment: Goal: General experience of comfort will improve Outcome: Adequate for Discharge   Problem: Safety: Goal: Ability to remain free from injury will improve Outcome: Adequate for Discharge   Problem: Skin Integrity: Goal: Risk for impaired skin integrity will decrease Outcome: Adequate for Discharge

## 2020-08-24 NOTE — Discharge Summary (Signed)
Physician Discharge Summary  [name] YTK:[id number] DOB: [birth year] DOA: 08/22/2020  PCP: Patient, No Pcp Per  Admit date: 08/22/2020 Discharge date: 08/24/2020  Discharge disposition: Home   Recommendations for Outpatient Follow-Up:   Follow up with PCP in 1 week   Discharge Diagnosis:   Principal Problem:   Postural dizziness with presyncope Active Problems:   Elevated troponin   Chronic systolic CHF (congestive heart failure), NYHA class 3 (HCC)   Coronary artery disease involving native coronary artery of native heart   Hyperlipidemia, mixed   Diabetes mellitus without complication (HCC)   BPH (benign prostatic hyperplasia)   AICD (automatic cardioverter/defibrillator) present   Carotid stenosis   Sacral decubitus ulcer   Near syncope    Discharge Condition: Stable.  Diet recommendation:  Diet Order            Diet - low sodium heart healthy           Diet heart healthy/carb modified Room service appropriate? Yes; Fluid consistency: Thin  Diet effective now                   Code Status: Full Code     Hospital Course:   [name] is a 87 year old man with past medical history for CAD, chronic systolic CHF s/p AICD, CKD stage 3a,  hyperlipidemia, DM, BPH with urinary retention and chronic indwelling foley catheter and carotid stenosis. He presented to the emergency room because of generalized weakness and dizziness that occurred while straining during a bowel movement. Symptoms were transient. His daughter-in-law who is a nurse checked her BP and it was apparently low.  Troponin was slightly elevated but he did not have any chest pain or shortness of breath. Elevated troponin was likely due to demand ischemia. He was given IV fluids for hydration. He was able to ambulate with PT without any problems. He's deemed stable for discharge. Discharge plan was discussed with her son over the phone.       Discharge Exam:    Vitals:    08/23/20 1549 08/23/20 1959 08/24/20 0500 08/24/20 0732  BP: 95/63 122/60 112/67 107/67  Pulse: 72 65 65 65  Resp: 20 16 16 16   Temp: (!) 97.5 F (36.4 C) 98.3 F (36.8 C) 98.9 F (37.2 C) 98.8 F (37.1 C)  TempSrc: Oral Oral Oral Oral  SpO2: 100% 98% 98% 97%  Weight:   78.2 kg   Height:         GEN: NAD SKIN: 2 decubitus ulcers on the right buttock and 1 decubitus ulcer on the left buttock EYES: EOMI ENT: MMM CV: RRR PULM: CTA B ABD: soft, ND, NT, +BS CNS: AAO x 3, non focal EXT: No edema or tenderness   The results of significant diagnostics from this hospitalization (including imaging, microbiology, ancillary and laboratory) are listed below for reference.     Procedures and Diagnostic Studies:   Carotid Bilateral  Result Date: 08/23/2020 CLINICAL DATA:  Syncope Hypertension EXAM: BILATERAL CAROTID DUPLEX ULTRASOUND TECHNIQUE: 08/25/2020 scale imaging, color Doppler and duplex ultrasound were performed of bilateral carotid and vertebral arteries in the neck. COMPARISON:  None. FINDINGS: Criteria: Quantification of carotid stenosis is based on velocity parameters that correlate the residual internal carotid diameter with NASCET-based stenosis levels, using the diameter of the distal internal carotid lumen as the denominator for stenosis measurement. The following velocity measurements were obtained: RIGHT ICA: 124/20 cm/sec CCA: 75/12 cm/sec SYSTOLIC ICA/CCA RATIO:  1.7 ECA:  235 cm/sec LEFT ICA: 79/19 cm/sec CCA: 117/12 cm/sec SYSTOLIC ICA/CCA RATIO:  0.7 ECA: 170 cm/sec RIGHT CAROTID ARTERY: Multifocal heterogeneous atheromatous plaque of the distal common and proximal internal carotid arteries. RIGHT VERTEBRAL ARTERY:  Antegrade flow. LEFT CAROTID ARTERY: Multifocal heterogeneous atheromatous plaque of the distal common and proximal internal carotid arteries. LEFT VERTEBRAL ARTERY:  Antegrade flow. IMPRESSION: Extensive atheromatous plaque of the distal common internal carotid  arteries. Although velocities of the internal carotid arteries are within normal limits, presence of shadowing plaque limits evaluation. Further evaluation with CT angiography of the neck should be considered for better characterization of degree of stenosis. Electronically Signed   By: Acquanetta Belling M.D.   On: 08/23/2020 10:42   DG Chest Port 1 View  Result Date: 08/23/2020 CLINICAL DATA:  Near syncope EXAM: PORTABLE CHEST 1 VIEW COMPARISON:  None. FINDINGS: Patchy bilateral pulmonary opacity. A similar pattern was not reported on outside chest x-ray 07/09/2020. Cardiomegaly with dual-chamber ICD/pacer leads. No visible effusion or pneumothorax. IMPRESSION: Patchy bilateral pulmonary infiltrate primarily worrisome for pneumonia. Cardiomegaly. Electronically Signed   By: Marnee Spring M.D.   On: 08/23/2020 05:06   ECHOCARDIOGRAM COMPLETE  Result Date: 08/23/2020    ECHOCARDIOGRAM REPORT   Patient Name:   [name] Date of Exam: 08/23/2020 Medical Rec #:  [id number]         Height:       72.0 in Accession #:    [id number]        Weight:       170.0 lb Date of Birth:  25-Apr-1929          BSA:          1.988 m Patient Age:    87 years          BP:           115/70 mmHg Patient Gender: M                 HR:           71 bpm. Exam Location:  [facility] Procedure: 2D Echo, Color Doppler and Cardiac Doppler Indications:     R55 Syncope  History:         Patient has no prior history of Echocardiogram examinations.                  CHF, Previous Myocardial Infarction and CAD, Defibrillator;                  Risk Factors:Diabetes.  Sonographer:     Humphrey Rolls RDCS (AE) Referring Phys:  [id number] Andris Baumann Diagnosing Phys: Harold Hedge MD  Sonographer Comments: Global longitudinal strain was attempted. IMPRESSIONS  1. Left ventricular ejection fraction, by estimation, is 40 to 45%. The left ventricle has mildly decreased function. The left ventricle demonstrates regional wall motion abnormalities (see scoring  diagram/findings for description). Left ventricular diastolic parameters were normal.  2. Right ventricular systolic function is normal. The right ventricular size is normal.  3. The mitral valve is grossly normal. Trivial mitral valve regurgitation.  4. The aortic valve is tricuspid. Aortic valve regurgitation is mild. FINDINGS  Left Ventricle: Left ventricular ejection fraction, by estimation, is 40 to 45%. The left ventricle has mildly decreased function. The left ventricle demonstrates regional wall motion abnormalities. The left ventricular internal cavity size was normal in size. There is no left ventricular hypertrophy. Left ventricular diastolic parameters were normal.  LV Wall Scoring: The mid and distal  anterior septum, apical lateral segment, mid inferoseptal segment, apical inferior segment, and apex are hypokinetic. The entire anterior wall, antero-lateral wall, inferior wall, posterior wall, basal anteroseptal segment, and basal inferoseptal segment are normal. Right Ventricle: The right ventricular size is normal. No increase in right ventricular wall thickness. Right ventricular systolic function is normal. Left Atrium: Left atrial size was normal in size. Right Atrium: Right atrial size was normal in size. Pericardium: There is no evidence of pericardial effusion. Mitral Valve: The mitral valve is grossly normal. Trivial mitral valve regurgitation. MV peak gradient, 4.8 mmHg. The mean mitral valve gradient is 3.0 mmHg. Tricuspid Valve: The tricuspid valve is not well visualized. Tricuspid valve regurgitation is trivial. Aortic Valve: The aortic valve is tricuspid. Aortic valve regurgitation is mild. Aortic regurgitation PHT measures 590 msec. Aortic valve mean gradient measures 6.0 mmHg. Aortic valve peak gradient measures 11.3 mmHg. Aortic valve area, by VTI measures 2.29 cm. Pulmonic Valve: The pulmonic valve was not well visualized. Pulmonic valve regurgitation is trivial. Aorta: The aortic root  is normal in size and structure. IAS/Shunts: The atrial septum is grossly normal.  LEFT VENTRICLE PLAX 2D LVIDd:         5.10 cm  Diastology LVIDs:         3.50 cm  LV e' medial:    5.22 cm/s LV PW:         1.20 cm  LV E/e' medial:  19.5 LV IVS:        0.90 cm  LV e' lateral:   5.22 cm/s LVOT diam:     2.10 cm  LV E/e' lateral: 19.5 LV SV:         79 LV SV Index:   40 LVOT Area:     3.46 cm  RIGHT VENTRICLE RV Basal diam:  2.80 cm LEFT ATRIUM             Index       RIGHT ATRIUM           Index LA diam:        3.30 cm 1.66 cm/m  RA Area:     17.40 cm LA Vol (A2C):   47.9 ml 24.09 ml/m RA Volume:   49.10 ml  24.69 ml/m LA Vol (A4C):   31.8 ml 15.99 ml/m LA Biplane Vol: 42.1 ml 21.17 ml/m  AORTIC VALVE                    PULMONIC VALVE AV Area (Vmax):    2.27 cm     PV Vmax:       1.19 m/s AV Area (Vmean):   2.35 cm     PV Vmean:      77.200 cm/s AV Area (VTI):     2.29 cm     PV VTI:        0.195 m AV Vmax:           168.00 cm/s  PV Peak grad:  5.7 mmHg AV Vmean:          119.000 cm/s PV Mean grad:  3.0 mmHg AV VTI:            0.344 m AV Peak Grad:      11.3 mmHg AV Mean Grad:      6.0 mmHg LVOT Vmax:         110.00 cm/s LVOT Vmean:        80.800 cm/s LVOT VTI:  0.227 m LVOT/AV VTI ratio: 0.66 AI PHT:            590 msec  AORTA Ao Root diam: 3.10 cm MITRAL VALVE                TRICUSPID VALVE MV Area (PHT): 3.11 cm     TR Peak grad:   39.9 mmHg MV Area VTI:   2.27 cm     TR Vmax:        316.00 cm/s MV Peak grad:  4.8 mmHg MV Mean grad:  3.0 mmHg     SHUNTS MV Vmax:       1.09 m/s     Systemic VTI:  0.23 m MV Vmean:      77.1 cm/s    Systemic Diam: 2.10 cm MV Decel Time: 244 msec MV E velocity: 102.00 cm/s MV A velocity: 82.70 cm/s MV E/A ratio:  1.23 Harold Hedge MD Electronically signed by Harold Hedge MD Signature Date/Time: 08/23/2020/4:51:52 PM    Final      Labs:   Basic Metabolic Panel: Recent Labs  Lab 08/22/20 2345 08/23/20 0446 08/23/20 2214  NA 138  --  137  K 4.5  --  4.9   CL 108  --  107  CO2 22  --  23  GLUCOSE 176*  --  146*  BUN 41*  --  36*  CREATININE 1.48* 1.37* 1.22  CALCIUM 8.7*  --  8.9  MG 1.9  --  2.0   GFR Estimated Creatinine Clearance: 44.2 mL/min (by C-G formula based on SCr of 1.22 mg/dL). Liver Function Tests: No results for input(s): AST, ALT, ALKPHOS, BILITOT, PROT, ALBUMIN in the last 168 hours. No results for input(s): LIPASE, AMYLASE in the last 168 hours. No results for input(s): AMMONIA in the last 168 hours. Coagulation profile No results for input(s): INR, PROTIME in the last 168 hours.  CBC: Recent Labs  Lab 08/22/20 2345 08/23/20 0446  WBC 9.7 10.1  NEUTROABS 5.9  --   HGB 11.9* 11.7*  HCT 37.4* 36.0*  MCV 105.4* 104.3*  PLT 183 180   Cardiac Enzymes: No results for input(s): CKTOTAL, CKMB, CKMBINDEX, TROPONINI in the last 168 hours. BNP: Invalid input(s): POCBNP CBG: Recent Labs  Lab 08/23/20 1151 08/23/20 1628 08/23/20 2109 08/24/20 0504 08/24/20 0741  GLUCAP 105* 170* 105* 123* 116*   D-Dimer No results for input(s): DDIMER in the last 72 hours. Hgb A1c Recent Labs    08/23/20 0446  HGBA1C 7.6*   Lipid Profile No results for input(s): CHOL, HDL, LDLCALC, TRIG, CHOLHDL, LDLDIRECT in the last 72 hours. Thyroid function studies No results for input(s): TSH, T4TOTAL, T3FREE, THYROIDAB in the last 72 hours.  Invalid input(s): FREET3 Anemia work up No results for input(s): VITAMINB12, FOLATE, FERRITIN, TIBC, IRON, RETICCTPCT in the last 72 hours. Microbiology Recent Results (from the past 240 hour(s))  Resp Panel by RT-PCR (Flu A&B, Covid) Urine, Catheterized     Status: None   Collection Time: 08/23/20  4:46 AM   Specimen: Urine, Catheterized; Nasopharyngeal(NP) swabs in vial transport medium  Result Value Ref Range Status   SARS Coronavirus 2 by RT PCR NEGATIVE NEGATIVE Final    Comment: (NOTE) SARS-CoV-2 target nucleic acids are NOT DETECTED.  The SARS-CoV-2 RNA is generally detectable  in upper respiratory specimens during the acute phase of infection. The lowest concentration of SARS-CoV-2 viral copies this assay can detect is 138 copies/mL. A negative result does not preclude SARS-Cov-2 infection and  should not be used as the sole basis for treatment or other patient management decisions. A negative result may occur with  improper specimen collection/handling, submission of specimen other than nasopharyngeal swab, presence of viral mutation(s) within the areas targeted by this assay, and inadequate number of viral copies(<138 copies/mL). A negative result must be combined with clinical observations, patient history, and epidemiological information. The expected result is Negative.  Fact Sheet for Patients:  BloggerCourse.comhttps://www.fda.gov/media/152166/download  Fact Sheet for Healthcare Providers:  SeriousBroker.ithttps://www.fda.gov/media/152162/download  This test is no t yet approved or cleared by the [place] FDA and  has been authorized for detection and/or diagnosis of SARS-CoV-2 by FDA under an Emergency Use Authorization (EUA). This EUA will remain  in effect (meaning this test can be used) for the duration of the COVID-19 declaration under Section 564(b)(1) of the Act, 21 U.S.C.section 360bbb-3(b)(1), unless the authorization is terminated  or revoked sooner.       Influenza A by PCR NEGATIVE NEGATIVE Final   Influenza B by PCR NEGATIVE NEGATIVE Final    Comment: (NOTE) The Xpert Xpress SARS-CoV-2/FLU/RSV plus assay is intended as an aid in the diagnosis of influenza from Nasopharyngeal swab specimens and should not be used as a sole basis for treatment. Nasal washings and aspirates are unacceptable for Xpert Xpress SARS-CoV-2/FLU/RSV testing.  Fact Sheet for Patients: BloggerCourse.comhttps://www.fda.gov/media/152166/download  Fact Sheet for Healthcare Providers: SeriousBroker.ithttps://www.fda.gov/media/152162/download  This test is not yet approved or cleared by the [place] FDA and has  been authorized for detection and/or diagnosis of SARS-CoV-2 by FDA under an Emergency Use Authorization (EUA). This EUA will remain in effect (meaning this test can be used) for the duration of the COVID-19 declaration under Section 564(b)(1) of the Act, 21 U.S.C. section 360bbb-3(b)(1), unless the authorization is terminated or revoked.  Performed at [facility], [address] [place], [place] [address]      Discharge Instructions:   Discharge Instructions    AMB referral to wound care center   Complete by: As directed    Long term sacral wound, may benefit from specialty care   Diet - low sodium heart healthy   Complete by: As directed    Discharge wound care:   Complete by: As directed    Follow up at the wound care clinic as scheduled on 08/26/2020   Increase activity slowly   Complete by: As directed      Allergies as of 08/24/2020   No Known Allergies     Medication List    STOP taking these medications   doxycycline 100 MG capsule Commonly known as: VIBRAMYCIN     TAKE these medications   acetaminophen 500 MG tablet Commonly known as: TYLENOL Take 1 tablet (500 mg total) by mouth every 6 (six) hours as needed. What changed:   how much to take  when to take this  reasons to take this   aspirin 81 MG EC tablet Take by mouth.   brimonidine 0.1 % Soln Commonly known as: ALPHAGAN P Place 1 drop into the left eye 2 (two) times daily.   Cholecalciferol 50 MCG (2000 UT) Caps Take 2,000 Units by mouth daily.   Coenzyme Q10 100 MG capsule Take 100 mg by mouth daily.   dorzolamide 2 % ophthalmic solution Commonly known as: TRUSOPT Place 1 drop into both eyes 2 (two) times daily.   finasteride 5 MG tablet Commonly known as: PROSCAR Take by mouth.   furosemide 40 MG tablet Commonly known as: LASIX Take 40  mg by mouth daily.   gabapentin 300 MG capsule Commonly known as: NEURONTIN Take 300 mg by mouth 2 (two) times daily.    latanoprost 0.005 % ophthalmic solution Commonly known as: XALATAN Place 1 drop into both eyes at bedtime.   metFORMIN 500 MG tablet Commonly known as: GLUCOPHAGE Take 500 mg by mouth daily with breakfast.   metoprolol succinate 25 MG 24 hr tablet Commonly known as: TOPROL-XL Take 25 mg by mouth daily.   Multi-Vitamin tablet Take 1 tablet by mouth daily.   mupirocin ointment 2 % Commonly known as: BACTROBAN Apply 1 application topically 3 (three) times daily for 7 days.   sacubitril-valsartan 24-26 MG Commonly known as: ENTRESTO Take by mouth.   tamsulosin 0.4 MG Caps capsule Commonly known as: FLOMAX Take by mouth.   triamcinolone ointment 0.5 % Commonly known as: KENALOG Apply 1 application topically 2 (two) times daily.            Discharge Care Instructions  (From admission, onward)         Start     Ordered   08/24/20 0000  Discharge wound care:       Comments: Follow up at the wound care clinic as scheduled on 08/26/2020   08/24/20 7829            Time coordinating discharge: 31 minutes  Signed:  Lurene Shadow  [facility] 08/24/2020, 9:26 AM   Pager on [email or web address]. If 7PM-7AM, please contact night-coverage at [email or web address]

## 2020-08-24 NOTE — Plan of Care (Signed)
  Problem: Education: Goal: Knowledge of General Education information will improve Description: Including pain rating scale, medication(s)/side effects and non-pharmacologic comfort measures Outcome: Progressing   Problem: Clinical Measurements: Goal: Ability to maintain clinical measurements within normal limits will improve Outcome: Progressing   Problem: Clinical Measurements: Goal: Will remain free from infection Outcome: Progressing   Problem: Clinical Measurements: Goal: Respiratory complications will improve Outcome: Progressing   Problem: Pain Managment: Goal: General experience of comfort will improve Outcome: Progressing

## 2020-08-26 ENCOUNTER — Other Ambulatory Visit: Payer: Self-pay

## 2020-08-26 ENCOUNTER — Encounter: Payer: Medicare Other | Attending: Physician Assistant | Admitting: Physician Assistant

## 2020-08-26 DIAGNOSIS — I5042 Chronic combined systolic (congestive) and diastolic (congestive) heart failure: Secondary | ICD-10-CM | POA: Insufficient documentation

## 2020-08-26 DIAGNOSIS — E119 Type 2 diabetes mellitus without complications: Secondary | ICD-10-CM | POA: Insufficient documentation

## 2020-08-26 DIAGNOSIS — I2510 Atherosclerotic heart disease of native coronary artery without angina pectoris: Secondary | ICD-10-CM | POA: Diagnosis not present

## 2020-08-26 DIAGNOSIS — L0231 Cutaneous abscess of buttock: Secondary | ICD-10-CM | POA: Diagnosis not present

## 2020-09-01 NOTE — Progress Notes (Signed)
[name], [name] ([id number]) Visit Report for 08/26/2020 Allergy List Details Patient Name: [name], [name]. Date of Service: 08/26/2020 1:00 PM Medical Record Number: [id number] Patient Account Number: [id number] Date of Birth/Sex: 07-Sep-1928 (87 y.o. M) Treating RN: [name] Primary Care [name]: [name] Other Clinician: Referring Carmine Carrozza: Hinda Kehr Treating [name]/Extender: [name] in Treatment: 0 Allergies Active Allergies No Known Allergies Allergy Notes Electronic Signature(s) Signed: 08/31/2020 4:52:45 PM By: Jeremy Coria RN Entered By: [name] on 08/26/2020 13:42:46 [name], [name] -------------------------------------------------------------------------------- Arrival Information Details Patient Name: [name], [name]. Date of Service: 08/26/2020 1:00 PM Medical Record Number: [id number] Patient Account Number: [id number] Date of Birth/Sex: 1929/01/28 (87 y.o. M) Treating RN: Dolan Amen Primary Care Malique Driskill: [name] Other Clinician: Referring Fletcher Rathbun: Hinda Kehr Treating Claiborne Stroble/Extender: Skipper Cliche in Treatment: 0 Visit Information Patient Arrived: Charlyn Minerva Time: 13:15 Accompanied By: son Transfer Assistance: None Patient Identification Verified: Yes Secondary Verification Process Completed: Yes Patient Requires Transmission-Based Precautions: No Patient Has Alerts: No Electronic Signature(s) Signed: 08/31/2020 4:52:45 PM By: Jeremy Coria RN Entered By: [name] on 08/26/2020 13:26:49 [name], [name] -------------------------------------------------------------------------------- Clinic Level of Care Assessment Details Patient Name: [name], [name]. Date of Service: 08/26/2020 1:00 PM Medical Record Number: [id number] Patient Account Number: [id number] Date of Birth/Sex: [birth year] (87 y.o. M) Treating RN: Dolan Amen Primary Care Torie Towle: [name] Other  Clinician: Referring Sabirin Baray: Hinda Kehr Treating Johann Santone/Extender: Skipper Cliche in Treatment: 0 Clinic Level of Care Assessment Items TOOL 4 Quantity Score X - Use when only an EandM is performed on FOLLOW-UP visit 1 0 ASSESSMENTS - Nursing Assessment / Reassessment X - Reassessment of Co-morbidities (includes updates in patient status) 1 10 X- 1 5 Reassessment of Adherence to Treatment Plan ASSESSMENTS - Wound and Skin Assessment / Reassessment _0  - Simple Wound Assessment / Reassessment - one wound 0 X- 3 5 Complex Wound Assessment / Reassessment - multiple wounds _1  - 0 Dermatologic / Skin Assessment (not related to wound area) ASSESSMENTS - Focused Assessment _2  - Circumferential Edema Measurements - multi extremities 0 _3  - 0 Nutritional Assessment / Counseling / Intervention _4  - 0 Lower Extremity Assessment (monofilament, tuning fork, pulses) _5  - 0 Peripheral Arterial Disease Assessment (using hand held doppler) ASSESSMENTS - Ostomy and/or Continence Assessment and Care _6  - Incontinence Assessment and Management 0 _7  - 0 Ostomy Care Assessment and Management (repouching, etc.) PROCESS - Coordination of Care X - Simple Patient / Family Education for ongoing care 1 15 _8  - 0 Complex (extensive) Patient / Family Education for ongoing care _9  - 0 Staff obtains Programmer, systems, Records, Test Results / Process Orders _10  - 0 Staff telephones HHA, Nursing Homes / Clarify orders / etc _11  - 0 Routine Transfer to another Facility (non-emergent condition) _12  - 0 Routine Hospital Admission (non-emergent condition) _13  - 0 New Admissions / Biomedical engineer / Ordering NPWT, Apligraf, etc. _14  - 0 Emergency Hospital Admission (emergent condition) X- 1 10 Simple Discharge Coordination _15  - 0 Complex (extensive) Discharge Coordination PROCESS - Special Needs _16  - Pediatric / Minor Patient Management 0 _17  - 0 Isolation Patient Management _18  - 0 Hearing / Language  / Visual special needs _19  - 0 Assessment of Community assistance (transportation, D/C planning, etc.) _20  - 0 Additional assistance / Altered mentation _21  - 0 Support Surface(s) Assessment (bed, cushion, seat, etc.) INTERVENTIONS - Wound Cleansing / Measurement [name], Jeremy J. ([id number]) _22  - 0 Simple Wound Cleansing - one wound X- 3 5 Complex  Wound Cleansing - multiple wounds X- 1 5 Wound Imaging (photographs - any number of wounds) _0  - 0 Wound Tracing (instead of photographs) _1  - 0 Simple Wound Measurement - one wound X- 3 5 Complex Wound Measurement - multiple wounds INTERVENTIONS - Wound Dressings _2  - Small Wound Dressing one or multiple wounds 0 X- 1 15 Medium Wound Dressing one or multiple wounds _3  - 0 Large Wound Dressing one or multiple wounds <JXBJYNWGNFAOZHYQ>_6<\/VHQIONGEXBMWUXLK>_4  - 0 Application of Medications - topical <MWNUUVOZDGUYQIHK>_7<\/QQVZDGLOVFIEPPIR>_5  - 0 Application of Medications - injection INTERVENTIONS - Miscellaneous _6  - External ear exam 0 _7  - 0 Specimen Collection (cultures, biopsies, blood, body fluids, etc.) _8  - 0 Specimen(s) / Culture(s) sent or taken to Lab for analysis _9  - 0 Patient Transfer (multiple staff / Civil Service fast streamer / Similar devices) _10  - 0 Simple Staple / Suture removal (25 or less) _11  - 0 Complex Staple / Suture removal (26 or more) _12  - 0 Hypo / Hyperglycemic Management (close monitor of Blood Glucose) _13  - 0 Ankle / Brachial Index (ABI) - do not check if billed separately X- 1 5 Vital Signs Has the patient been seen at the hospital within the last three years: Yes Total Score: 110 Level Of Care: New/Established - Level 3 Electronic Signature(s) Signed: 08/26/2020 4:52:24 PM By: [name], [name] RN Entered By: [name], [name] on 08/26/2020 14:28:06 [name] ([id number]) -------------------------------------------------------------------------------- Encounter Discharge Information Details Patient Name: [name], [name]. Date of Service: 08/26/2020 1:00  PM Medical Record Number: [id number] Patient Account Number: [id number] Date of Birth/Sex: 1929-01-20 (87 y.o. M) Treating RN: Dolan Amen Primary Care Sandon Yoho: [name] Other Clinician: Referring Marily Konczal: Hinda Kehr Treating Keirstin Musil/Extender: Skipper Cliche in Treatment: 0 Encounter Discharge Information Items Post Procedure Vitals Discharge Condition: Stable Temperature (F): 97.7 Ambulatory Status: Walker Pulse (bpm): 59 Discharge Destination: Home Respiratory Rate (breaths/min): 18 Transportation: Private Auto Blood Pressure (mmHg): 110/68 Accompanied By: son Schedule Follow-up Appointment: Yes Clinical Summary of Care: Electronic Signature(s) Signed: 08/26/2020 4:52:24 PM By: [name], [name] RN Entered By: [name], [name] on 08/26/2020 14:30:06 Hendrie, Shelda Altes ([id number]) -------------------------------------------------------------------------------- Lower Extremity Assessment Details Patient Name: [name], [name]. Date of Service: 08/26/2020 1:00 PM Medical Record Number: [id number] Patient Account Number: [id number] Date of Birth/Sex: 1929/05/12 (87 y.o. M) Treating RN: [name] Primary Care Isami Mehra: [name] Other Clinician: Referring Starsha Morning: Hinda Kehr Treating Breckon Reeves/Extender: Skipper Cliche in Treatment: 0 Electronic Signature(s) Signed: 08/31/2020 4:52:45 PM By: Jeremy Coria RN Entered By: [name] on 08/26/2020 13:41:50 Guardiola, Shelda Altes ([id number]) -------------------------------------------------------------------------------- Multi Wound Chart Details Patient Name: [name], [name]. Date of Service: 08/26/2020 1:00 PM Medical Record Number: [id number] Patient Account Number: [id number] Date of Birth/Sex: [birth year] (87 y.o. M) Treating RN: Dolan Amen Primary Care Cathern Tahir: [name] Other Clinician: Referring Nakiya Rallis: Hinda Kehr Treating Rashonda Warrior/Extender: Skipper Cliche in Treatment:  0 Vital Signs Height(in): 70 Pulse(bpm): 29 Weight(lbs): 170 Blood Pressure(mmHg): 110/68 Body Mass Index(BMI): 24 Temperature(F): 97.7 Respiratory Rate(breaths/min): 18 Photos: Wound Location: Right, Proximal Gluteus Left, Distal Gluteus Right Gluteus Wounding Event: Gradually Appeared Gradually Appeared Gradually Appeared Primary Etiology: Abscess Abscess Abscess Date Acquired: 08/03/2020 08/03/2020 08/03/2020 Wallace of Treatment: 0 0 0 Wound Status: Open Open Open Measurements L x W x D (cm) 0.7x0.5x0.3 1.7x0.8x0.2 0.3x0.2x0.1 Area (cm) : 0.275 1.068 0.047 Volume (cm) : 0.082 0.214 0.005 Classification: Full Thickness Without Exposed Full Thickness Without Exposed Full Thickness Without Exposed Support Structures Support Structures Support Structures Exudate Amount: Medium Medium Medium Exudate Type: Serosanguineous  Serosanguineous Serosanguineous Exudate Color: red, brown red, brown red, brown Granulation Amount: None Present (0%) None Present (0%) None Present (0%) Necrotic Amount: Large (67-100%) Large (67-100%) Large (67-100%) Exposed Structures: Fat Layer (Subcutaneous Tissue): Fat Layer (Subcutaneous Tissue): Fat Layer (Subcutaneous Tissue): Yes Yes Yes Fascia: No Fascia: No Fascia: No Tendon: No Tendon: No Tendon: No Muscle: No Muscle: No Muscle: No Joint: No Joint: No Joint: No Bone: No Bone: No Bone: No Epithelialization: None None None Treatment Notes Electronic Signature(s) Signed: 08/26/2020 4:52:24 PM By: [name], [name] RN Entered By: [name], [name] on 08/26/2020 14:13:46 Capano, Shelda Altes ([id number]) -------------------------------------------------------------------------------- Multi-Disciplinary Care Plan Details Patient Name: [name], [name]. Date of Service: 08/26/2020 1:00 PM Medical Record Number: [id number] Patient Account Number: [id number] Date of Birth/Sex: May 02, 1929 (87 y.o. M) Treating RN: Dolan Amen Primary  Care Rennae Ferraiolo: [name] Other Clinician: Referring Mataio Mele: Hinda Kehr Treating Santino Kinsella/Extender: Skipper Cliche in Treatment: 0 Active Inactive Orientation to the Wound Care Program Nursing Diagnoses: Knowledge deficit related to the wound healing center program Goals: Patient/caregiver will verbalize understanding of the [facility] Program Date Initiated: 08/26/2020 Target Resolution Date: 08/26/2020 Goal Status: Active Interventions: Provide education on orientation to the wound center Notes: Wound/Skin Impairment Nursing Diagnoses: Impaired tissue integrity Goals: Patient/caregiver will verbalize understanding of skin care regimen Date Initiated: 08/26/2020 Target Resolution Date: 08/26/2020 Goal Status: Active Ulcer/skin breakdown will have a volume reduction of 30% by week 4 Date Initiated: 08/26/2020 Target Resolution Date: 09/23/2020 Goal Status: Active Ulcer/skin breakdown will have a volume reduction of 50% by week 8 Date Initiated: 08/26/2020 Target Resolution Date: 10/24/2020 Goal Status: Active Ulcer/skin breakdown will have a volume reduction of 80% by week 12 Date Initiated: 08/26/2020 Target Resolution Date: 11/23/2020 Goal Status: Active Interventions: Assess patient/caregiver ability to obtain necessary supplies Assess patient/caregiver ability to perform ulcer/skin care regimen upon admission and as needed Assess ulceration(s) every visit Provide education on ulcer and skin care Treatment Activities: Skin care regimen initiated : 08/26/2020 Topical wound management initiated : 08/26/2020 Notes: Electronic Signature(s) Signed: 08/26/2020 4:52:24 PM By: [name], [name] RN Entered By: [name], [name] on 08/26/2020 14:13:27 Zawadzki, Shelda Altes ([id number]) -------------------------------------------------------------------------------- Pain Assessment Details Patient Name: [name], [name]. Date of Service: 08/26/2020 1:00  PM Medical Record Number: [id number] Patient Account Number: [id number] Date of Birth/Sex: September 29, 1928 (87 y.o. M) Treating RN: Dolan Amen Primary Care Gianelle Mccaul: [name] Other Clinician: Referring Zahriah Roes: Hinda Kehr Treating Chrissa Meetze/Extender: Skipper Cliche in Treatment: 0 Active Problems Location of Pain Severity and Description of Pain Patient Has Paino No Site Locations Pain Management and Medication Current Pain Management: Electronic Signature(s) Signed: 08/26/2020 4:52:24 PM By: [name], [name] RN Signed: 08/31/2020 4:52:45 PM By: Jeremy Coria RN Entered By: [name] on 08/26/2020 13:27:20 Champney, Shelda Altes ([id number]) -------------------------------------------------------------------------------- Patient/Caregiver Education Details Patient Name: [name], [name]. Date of Service: 08/26/2020 1:00 PM Medical Record Number: [id number] Patient Account Number: [id number] Date of Birth/Gender: June 25, 1929 (87 y.o. M) Treating RN: Dolan Amen Primary Care Physician: [name] Other Clinician: Referring Physician: Hinda Kehr Treating Physician/Extender: Skipper Cliche in Treatment: 0 Education Assessment Education Provided To: Patient Education Topics Provided Welcome To [facility]: Methods: Explain/Verbal Responses: State content correctly Wound/Skin Impairment: Methods: Explain/Verbal Responses: State content correctly Electronic Signature(s) Signed: 08/26/2020 4:52:24 PM By: [name], [name] RN Entered By: [name], [name] on 08/26/2020 14:29:16 Franklyn, Shelda Altes ([id number]) -------------------------------------------------------------------------------- Wound Assessment Details Patient Name: [name], [name]. Date of Service: 08/26/2020 1:00 PM Medical  Record Number: [id number] Patient Account Number: [id number] Date of Birth/Sex: 27-Dec-1928 (87 y.o. M) Treating RN: [name] Primary Care Aeden Matranga: [name] Other Clinician: Referring Glynn Yepes: Hinda Kehr Treating Xayvier Vallez/Extender: Skipper Cliche in Treatment: 0 Wound Status Wound Number: 1 Primary Etiology: Abscess Wound Location: Right, Proximal Gluteus Wound Status: Open Wounding Event: Gradually Appeared Date Acquired: 08/03/2020 Wallace Of Treatment: 0 Clustered Wound: No Photos Wound Measurements Length: (cm) 0.7 Width: (cm) 0.5 Depth: (cm) 0.3 Area: (cm) 0.275 Volume: (cm) 0.082 % Reduction in Area: % Reduction in Volume: Epithelialization: None Tunneling: No Undermining: No Wound Description Classification: Full Thickness Without Exposed Support Structures Exudate Amount: Medium Exudate Type: Serosanguineous Exudate Color: red, brown Foul Odor After Cleansing: No Slough/Fibrino Yes Wound Bed Granulation Amount: None Present (0%) Exposed Structure Necrotic Amount: Large (67-100%) Fascia Exposed: No Necrotic Quality: Adherent Slough Fat Layer (Subcutaneous Tissue) Exposed: Yes Tendon Exposed: No Muscle Exposed: No Joint Exposed: No Bone Exposed: No Treatment Notes Wound #1 (Gluteus) Wound Laterality: Right, Proximal Cleanser [place]ram Ancillary Kit - 15 Day Supply Discharge Instruction: Use supplies as instructed; Kit contains: (15) Saline Bullets; (15) 3x3 Gauze; 15 pr Gloves Normal Saline Discharge Instruction: Wash your hands with soap and water. Remove old dressing, discard into plastic bag and place into trash. Cleanse the wound with Normal Saline prior to applying a clean dressing using gauze sponges, not tissues or cotton balls. Do not CAMEO, SHEWELL. ([id number]) scrub or use excessive force. Pat dry using gauze sponges, not tissue or cotton balls. Peri-Wound Care Topical Primary Dressing Prisma 4.34 (in) Discharge Instruction: Moisten w/normal saline or sterile water; Cover wound as directed. Do not remove from wound bed. Gauze packing strip 1/4 (in) Discharge Instruction: [place] 1/4 in as instructed. Secondary Dressing Non-Adherent Pad 3x8 (in/in) Secured With Tegaderm Film 4x4 (in/in) Discharge Instruction: Apply to wound bed Compression Wrap Compression Stockings Add-Ons Electronic Signature(s) Signed: 08/31/2020 4:52:45 PM By: Jeremy Coria RN Entered By: [name] on 08/26/2020 13:38:27 Rastetter, Shelda Altes ([id number]) -------------------------------------------------------------------------------- Wound Assessment Details Patient Name: [name], AIKEY. Date of Service: 08/26/2020 1:00 PM Medical Record Number: [id number] Patient Account Number: [id number] Date of Birth/Sex: [birth year] (87 y.o. M) Treating RN: [name] Primary Care Tammala Weider: [name] Other Clinician: Referring Carmelia Tiner: Hinda Kehr Treating Jae Skeet/Extender: Skipper Cliche in Treatment: 0 Wound Status Wound Number: 2 Primary Etiology: Abscess Wound Location: Left, Distal Gluteus Wound Status: Open Wounding Event: Gradually Appeared Date Acquired: 08/03/2020 Wallace Of Treatment: 0 Clustered Wound: No Photos Wound Measurements Length: (cm) 1.7 Width: (cm) 0.8 Depth: (cm) 0.2 Area: (cm) 1.068 Volume: (cm) 0.214 % Reduction in Area: % Reduction in Volume: Epithelialization: None Tunneling: No Undermining: No Wound Description Classification: Full Thickness Without Exposed Support Structures Exudate Amount: Medium Exudate Type: Serosanguineous Exudate Color: red, brown Foul Odor After Cleansing: No Slough/Fibrino Yes Wound Bed Granulation Amount: None Present (0%) Exposed Structure Necrotic Amount: Large (67-100%) Fascia Exposed: No Necrotic Quality: Adherent Slough Fat Layer (Subcutaneous Tissue) Exposed: Yes Tendon Exposed: No Muscle Exposed: No Joint Exposed: No Bone Exposed: No Treatment Notes Wound #2 (Gluteus) Wound Laterality: Left, Distal Cleanser Normal Saline Discharge Instruction: Wash your hands with soap and water. Remove  old dressing, discard into plastic bag and place into trash. Cleanse the wound with Normal Saline prior to applying a clean dressing using gauze sponges, not tissues or cotton balls. Do not scrub or use excessive force. Pat dry using gauze sponges, not tissue or cotton balls. VONTRELL, PULLMAN ([id number])  Peri-Wound Care Topical Primary Dressing Prisma 4.34 (in) Discharge Instruction: Moisten w/normal saline or sterile water; Cover wound as directed. Do not remove from wound bed. Secondary Dressing Non-Adherent Pad 3x8 (in/in) Secured With Tegaderm Film 4x4 (in/in) Discharge Instruction: Apply to wound bed Compression Wrap Compression Stockings Add-Ons Electronic Signature(s) Signed: 08/31/2020 4:52:45 PM By: Jeremy Coria RN Entered By: [name] on 08/26/2020 13:40:11 Payes, Shelda Altes ([id number]) -------------------------------------------------------------------------------- Wound Assessment Details Patient Name: VONN, SLIGER. Date of Service: 08/26/2020 1:00 PM Medical Record Number: [id number] Patient Account Number: [id number] Date of Birth/Sex: [birth year] (87 y.o. M) Treating RN: [name] Primary Care Torri Michalski: [name] Other Clinician: Referring Kalix Meinecke: Hinda Kehr Treating Seamus Warehime/Extender: Skipper Cliche in Treatment: 0 Wound Status Wound Number: 3 Primary Etiology: Abscess Wound Location: Right Gluteus Wound Status: Open Wounding Event: Gradually Appeared Date Acquired: 08/03/2020 Wallace Of Treatment: 0 Clustered Wound: No Photos Wound Measurements Length: (cm) 0.3 Width: (cm) 0.2 Depth: (cm) 0.1 Area: (cm) 0.047 Volume: (cm) 0.005 % Reduction in Area: % Reduction in Volume: Epithelialization: None Tunneling: No Undermining: No Wound Description Classification: Full Thickness Without Exposed Support Structures Exudate Amount: Medium Exudate Type: Serosanguineous Exudate Color: red, brown Foul Odor After Cleansing:  No Slough/Fibrino Yes Wound Bed Granulation Amount: None Present (0%) Exposed Structure Necrotic Amount: Large (67-100%) Fascia Exposed: No Necrotic Quality: Adherent Slough Fat Layer (Subcutaneous Tissue) Exposed: Yes Tendon Exposed: No Muscle Exposed: No Joint Exposed: No Bone Exposed: No Treatment Notes Wound #3 (Gluteus) Wound Laterality: Right Cleanser Normal Saline Discharge Instruction: Wash your hands with soap and water. Remove old dressing, discard into plastic bag and place into trash. Cleanse the wound with Normal Saline prior to applying a clean dressing using gauze sponges, not tissues or cotton balls. Do not scrub or use excessive force. Pat dry using gauze sponges, not tissue or cotton balls. RYLEY, BACHTEL ([id number]) Peri-Wound Care Topical Primary Dressing Prisma 4.34 (in) Discharge Instruction: Moisten w/normal saline or sterile water; Cover wound as directed. Do not remove from wound bed. Secondary Dressing Non-Adherent Pad 3x8 (in/in) Secured With Tegaderm Film 4x4 (in/in) Discharge Instruction: Apply to wound bed Compression Wrap Compression Stockings Add-Ons Electronic Signature(s) Signed: 08/31/2020 4:52:45 PM By: Jeremy Coria RN Entered By: [name] on 08/26/2020 13:41:33 Tarlton, Shelda Altes ([id number]) -------------------------------------------------------------------------------- Vitals Details Patient Name: JERMICHAEL, BELMARES. Date of Service: 08/26/2020 1:00 PM Medical Record Number: [id number] Patient Account Number: [id number] Date of Birth/Sex: 03-16-1929 (87 y.o. M) Treating RN: Dolan Amen Primary Care Adasha Boehme: [name] Other Clinician: Referring Esmond Hinch: Hinda Kehr Treating Lylliana Kitamura/Extender: Skipper Cliche in Treatment: 0 Vital Signs Time Taken: 13:15 Temperature (F): 97.7 Height (in): 70 Pulse (bpm): 59 Source: Stated Respiratory Rate (breaths/min): 18 Weight (lbs): 170 Blood Pressure (mmHg):  110/68 Source: Stated Reference Range: 80 - 120 mg / dl Body Mass Index (BMI): 24.4 Electronic Signature(s) Signed: 08/31/2020 4:52:45 PM By: Jeremy Coria RN Entered By: [name] on 08/26/2020 13:27:30

## 2020-09-01 NOTE — Progress Notes (Signed)
KAVIR, SAVOCA ([id number]) Visit Report for 08/26/2020 Chief Complaint Document Details Patient Name: [name], [name]. Date of Service: 08/26/2020 1:00 PM Medical Record Number: [id number] Patient Account Number: [id number] Date of Birth/Sex: 01-10-1929 (87 y.o. M) Treating RN: Dolan Amen Primary Care Provider: Ezequiel Kayser Other Clinician: Referring Provider: Hinda Kehr Treating Provider/Extender: Skipper Cliche in Treatment: 0 Information Obtained from: Patient Chief Complaint Bilateral gluteal abscesses Electronic Signature(s) Signed: 08/26/2020 2:09:06 PM By: Worthy Keeler PA-C Entered By: Worthy Keeler on 08/26/2020 14:09:06 [name], [name] ([id number]) -------------------------------------------------------------------------------- Debridement Details Patient Name: [name], [name]. Date of Service: 08/26/2020 1:00 PM Medical Record Number: [id number] Patient Account Number: [id number] Date of Birth/Sex: [birth year] (87 y.o. M) Treating RN: Dolan Amen Primary Care Provider: Ezequiel Kayser Other Clinician: Referring Provider: Hinda Kehr Treating Provider/Extender: Skipper Cliche in Treatment: 0 Debridement Performed for Wound #1 Right,Proximal Gluteus Assessment: Performed By: Physician Tommie Sams., PA-C Debridement Type: Debridement Level of Consciousness (Pre- Awake and Alert procedure): Pre-procedure Verification/Time Out Yes - 14:12 Taken: Start Time: 14:12 Total Area Debrided (L x W): 0.7 (cm) x 0.5 (cm) = 0.35 (cm) Tissue and other material Viable, Non-Viable, Slough, Subcutaneous, Slough debrided: Level: Skin/Subcutaneous Tissue Debridement Description: Excisional Instrument: Curette Bleeding: Minimum Hemostasis Achieved: Pressure Response to Treatment: Procedure was tolerated well Level of Consciousness (Post- Awake and Alert procedure): Post Debridement Measurements of Total Wound Length: (cm) 0.7 Width: (cm)  0.5 Depth: (cm) 0.7 Volume: (cm) 0.192 Character of Wound/Ulcer Post Debridement: Stable Post Procedure Diagnosis Same as Pre-procedure Electronic Signature(s) Signed: 08/26/2020 4:52:24 PM By: Charlett Nose RN Signed: 08/26/2020 5:47:45 PM By: Worthy Keeler PA-C Entered By: [name], [name] on 08/26/2020 14:15:26 [name] ([id number]) -------------------------------------------------------------------------------- HPI Details Patient Name: [name], [name]. Date of Service: 08/26/2020 1:00 PM Medical Record Number: [id number] Patient Account Number: [id number] Date of Birth/Sex: [birth year] (87 y.o. M) Treating RN: Dolan Amen Primary Care Provider: Ezequiel Kayser Other Clinician: Referring Provider: Hinda Kehr Treating Provider/Extender: Skipper Cliche in Treatment: 0 History of Present Illness HPI Description: 08/26/2020 upon evaluation today patient presents for initial inspection here in our clinic concerning issues he is been having with his gluteal region. He has several abscesses 2 on the right 1 on the left that have been present since at least December. With that being said triamcinolone and mupirocin has been used currently along with Desitin unfortunately this just does not seem to be making the progress that they would like to see. Obviously I think that the patient is not really showing any signs of infection right now which is good he was on doxycycline as prescribed by urgent care until he was seen in the ER in hospital where they apparently stopped this. Patient does have diabetes. He does also have a history of coronary artery disease. Subsequently his troponin was elevated in the hospital that is part of the reason why they kept him for analysis while he was there. Otherwise the patient seems to be doing well and I think he has a good chance of getting this to heal with appropriate treatment. Electronic Signature(s) Signed: 08/26/2020  2:31:46 PM By: Worthy Keeler PA-C Entered By: Worthy Keeler on 08/26/2020 14:31:46 [name], [name] ([id number]) -------------------------------------------------------------------------------- Physical Exam Details Patient Name: [name], [name]. Date of Service: 08/26/2020 1:00 PM Medical Record Number: [id number] Patient Account Number: [id number] Date of Birth/Sex: [birth year] (87 y.o. M) Treating RN: Dolan Amen Primary Care Provider: Ezequiel Kayser Other Clinician: Referring Provider: Karma Greaser,  CORY Treating Provider/Extender: Jeri Cos Weeks in Treatment: 0 Constitutional sitting or standing blood pressure is within target range for patient.. pulse regular and within target range for patient.Marland Kitchen respirations regular, non- labored and within target range for patient.Marland Kitchen temperature within target range for patient.. Well-nourished and well-hydrated in no acute distress. Eyes conjunctiva clear no eyelid edema noted. pupils equal round and reactive to light and accommodation. Ears, Nose, Mouth, and Throat no gross abnormality of ear auricles or external auditory canals. normal hearing noted during conversation. mucus membranes moist. Respiratory normal breathing without difficulty. Psychiatric this patient is able to make decisions and demonstrates good insight into disease process. Alert and Oriented x 3. pleasant and cooperative. Notes Patient's wound again showed signs of some necrotic tissue I did perform sharp debridement in regard to the proximal right and largest of all the wounds. There was some depth to this post debridement especially and this appeared to be doing better nonetheless I am pleased with how that appears currently post debridement I still think we can need to try to pack this in order to allow it to heal in from the bottom outward. Patient and his son are in agreement with the plan. Fortunately the patient's daughter-in-law is actually a Equities trader and  can help as well with the dressing changes. Electronic Signature(s) Signed: 08/26/2020 2:32:40 PM By: Worthy Keeler PA-C Entered By: Worthy Keeler on 08/26/2020 14:32:40 Gartner, [name] ([id number]) -------------------------------------------------------------------------------- Physician Orders Details Patient Name: [name], [name]. Date of Service: 08/26/2020 1:00 PM Medical Record Number: [id number] Patient Account Number: [id number] Date of Birth/Sex: June 06, 1929 (87 y.o. M) Treating RN: Dolan Amen Primary Care Provider: Ezequiel Kayser Other Clinician: Referring Provider: Hinda Kehr Treating Provider/Extender: Skipper Cliche in Treatment: 0 Verbal / Phone Orders: No Diagnosis Coding ICD-10 Coding Code Description L02.31 Cutaneous abscess of buttock L98.412 Non-pressure chronic ulcer of buttock with fat layer exposed E11.622 Type 2 diabetes mellitus with other skin ulcer I25.10 Atherosclerotic heart disease of native coronary artery without angina pectoris I50.42 Chronic combined systolic (congestive) and diastolic (congestive) heart failure Follow-up Appointments Wound #1 Right,Proximal Gluteus o Return Appointment in 2 weeks. Wound #2 Left,Distal Gluteus o Return Appointment in 2 weeks. Wound #3 Right Gluteus o Return Appointment in 2 weeks. Off-Loading Wound #1 Right,Proximal Gluteus o Turn and reposition every 2 hours Wound #2 Left,Distal Gluteus o Turn and reposition every 2 hours Wound #3 Right Gluteus o Turn and reposition every 2 hours Wound Treatment Wound #1 - Gluteus Wound Laterality: Right, Proximal Cleanser: Byram Ancillary Kit - 15 Day Supply (DME) (Generic) 3 x Per Week/30 Days Discharge Instructions: Use supplies as instructed; Kit contains: (15) Saline Bullets; (15) 3x3 Gauze; 15 pr Gloves Cleanser: Normal Saline (DME) (Generic) 3 x Per Week/30 Days Discharge Instructions: Wash your hands with soap and water. Remove old  dressing, discard into plastic bag and place into trash. Cleanse the wound with Normal Saline prior to applying a clean dressing using gauze sponges, not tissues or cotton balls. Do not scrub or use excessive force. Pat dry using gauze sponges, not tissue or cotton balls. Primary Dressing: Prisma 4.34 (in) (DME) (Generic) 3 x Per Week/30 Days Discharge Instructions: Moisten w/normal saline or sterile water; Cover wound as directed. Do not remove from wound bed. Primary Dressing: Gauze packing strip 1/4 (in) (DME) (Generic) 3 x Per Week/30 Days Discharge Instructions: Apply Plain Packing Strip 1/4 in as instructed. Secondary Dressing: Non-Adherent Pad 3x8 (in/in) (DME) (Generic) 3 x Per Week/30 Days  Secured With: Tegaderm Film 4x4 (in/in) 3 x Per Week/30 Days Discharge Instructions: Apply to wound bed Wound #2 - Gluteus Wound Laterality: Left, Distal Cleanser: Normal Saline (DME) (Generic) 3 x Per Week/30 Days MASASHI, SNOWDON ([id number]) Discharge Instructions: Wash your hands with soap and water. Remove old dressing, discard into plastic bag and place into trash. Cleanse the wound with Normal Saline prior to applying a clean dressing using gauze sponges, not tissues or cotton balls. Do not scrub or use excessive force. Pat dry using gauze sponges, not tissue or cotton balls. Primary Dressing: Prisma 4.34 (in) (Generic) 3 x Per Week/30 Days Discharge Instructions: Moisten w/normal saline or sterile water; Cover wound as directed. Do not remove from wound bed. Secondary Dressing: Non-Adherent Pad 3x8 (in/in) (Generic) 3 x Per Week/30 Days Secured With: Tegaderm Film 4x4 (in/in) 3 x Per Week/30 Days Discharge Instructions: Apply to wound bed Wound #3 - Gluteus Wound Laterality: Right Cleanser: Normal Saline (Generic) 3 x Per Week/30 Days Discharge Instructions: Wash your hands with soap and water. Remove old dressing, discard into plastic bag and place into trash. Cleanse the wound with  Normal Saline prior to applying a clean dressing using gauze sponges, not tissues or cotton balls. Do not scrub or use excessive force. Pat dry using gauze sponges, not tissue or cotton balls. Primary Dressing: Prisma 4.34 (in) (Generic) 3 x Per Week/30 Days Discharge Instructions: Moisten w/normal saline or sterile water; Cover wound as directed. Do not remove from wound bed. Secondary Dressing: Non-Adherent Pad 3x8 (in/in) (DME) (Generic) 3 x Per Week/30 Days Secured With: Tegaderm Film 4x4 (in/in) 3 x Per Week/30 Days Discharge Instructions: Apply to wound bed Electronic Signature(s) Signed: 08/26/2020 4:52:24 PM By: [name], [name] RN Signed: 08/26/2020 5:47:45 PM By: Worthy Keeler PA-C Entered By: [name], [name] on 08/26/2020 14:27:00 [place], [name] ([id number]) -------------------------------------------------------------------------------- Problem List Details Patient Name: KADRIAN, PARTCH. Date of Service: 08/26/2020 1:00 PM Medical Record Number: [id number] Patient Account Number: [id number] Date of Birth/Sex: 1929/07/02 (87 y.o. M) Treating RN: Dolan Amen Primary Care Provider: Ezequiel Kayser Other Clinician: Referring Provider: Hinda Kehr Treating Provider/Extender: Skipper Cliche in Treatment: 0 Active Problems ICD-10 Encounter Code Description Active Date MDM Diagnosis L02.31 Cutaneous abscess of buttock 08/26/2020 No Yes L98.412 Non-pressure chronic ulcer of buttock with fat layer exposed 08/26/2020 No Yes E11.622 Type 2 diabetes mellitus with other skin ulcer 08/26/2020 No Yes I25.10 Atherosclerotic heart disease of native coronary artery without angina 08/26/2020 No Yes pectoris I50.42 Chronic combined systolic (congestive) and diastolic (congestive) heart 08/26/2020 No Yes failure Inactive Problems Resolved Problems Electronic Signature(s) Signed: 08/26/2020 2:08:37 PM By: Worthy Keeler PA-C Entered By: Worthy Keeler on 08/26/2020  14:08:37 [place], [name] ([id number]) -------------------------------------------------------------------------------- Progress Note Details Patient Name: Jeremy Longest. Date of Service: 08/26/2020 1:00 PM Medical Record Number: [id number] Patient Account Number: [id number] Date of Birth/Sex: September 05, 1928 (87 y.o. M) Treating RN: Dolan Amen Primary Care Provider: Ezequiel Kayser Other Clinician: Referring Provider: Hinda Kehr Treating Provider/Extender: Skipper Cliche in Treatment: 0 Subjective Chief Complaint Information obtained from Patient Bilateral gluteal abscesses History of Present Illness (HPI) 08/26/2020 upon evaluation today patient presents for initial inspection here in our clinic concerning issues he is been having with his gluteal region. He has several abscesses 2 on the right 1 on the left that have been present since at least December. With that being said triamcinolone and mupirocin has been used currently along with Desitin unfortunately this just does not  seem to be making the progress that they would like to see. Obviously I think that the patient is not really showing any signs of infection right now which is good he was on doxycycline as prescribed by urgent care until he was seen in the ER in hospital where they apparently stopped this. Patient does have diabetes. He does also have a history of coronary artery disease. Subsequently his troponin was elevated in the hospital that is part of the reason why they kept him for analysis while he was there. Otherwise the patient seems to be doing well and I think he has a good chance of getting this to heal with appropriate treatment. Patient History Allergies No Known Allergies Social History Never smoker, Marital Status - Married, Alcohol Use - Rarely, Drug Use - No History, Caffeine Use - Daily. Objective Constitutional sitting or standing blood pressure is within target range for patient.. pulse regular  and within target range for patient.Marland Kitchen respirations regular, non- labored and within target range for patient.Marland Kitchen temperature within target range for patient.. Well-nourished and well-hydrated in no acute distress. Vitals Time Taken: 1:15 PM, Height: 70 in, Source: Stated, Weight: 170 lbs, Source: Stated, BMI: 24.4, Temperature: 97.7 F, Pulse: 59 bpm, Respiratory Rate: 18 breaths/min, Blood Pressure: 110/68 mmHg. Eyes conjunctiva clear no eyelid edema noted. pupils equal round and reactive to light and accommodation. Ears, Nose, Mouth, and Throat no gross abnormality of ear auricles or external auditory canals. normal hearing noted during conversation. mucus membranes moist. Respiratory normal breathing without difficulty. Psychiatric this patient is able to make decisions and demonstrates good insight into disease process. Alert and Oriented x 3. pleasant and cooperative. General Notes: Patient's wound again showed signs of some necrotic tissue I did perform sharp debridement in regard to the proximal right and largest of all the wounds. There was some depth to this post debridement especially and this appeared to be doing better nonetheless I am pleased with how that appears currently post debridement I still think we can need to try to pack this in order to allow it to heal in from the bottom outward. Patient and his son are in agreement with the plan. Fortunately the patient's daughter-in-law is actually a Equities trader and can help as well with the dressing changes. Integumentary (Hair, Skin) Wound #1 status is Open. Original cause of wound was Gradually Appeared. The date acquired was: 08/03/2020. The wound is located on the [name], [name]. ([id number]) Right,Proximal Gluteus. The wound measures 0.7cm length x 0.5cm width x 0.3cm depth; 0.275cm^2 area and 0.082cm^3 volume. There is Fat Layer (Subcutaneous Tissue) exposed. There is no tunneling or undermining noted. There is a medium  amount of serosanguineous drainage noted. There is no granulation within the wound bed. There is a large (67-100%) amount of necrotic tissue within the wound bed including Adherent Slough. Wound #2 status is Open. Original cause of wound was Gradually Appeared. The date acquired was: 08/03/2020. The wound is located on the Left,Distal Gluteus. The wound measures 1.7cm length x 0.8cm width x 0.2cm depth; 1.068cm^2 area and 0.214cm^3 volume. There is Fat Layer (Subcutaneous Tissue) exposed. There is no tunneling or undermining noted. There is a medium amount of serosanguineous drainage noted. There is no granulation within the wound bed. There is a large (67-100%) amount of necrotic tissue within the wound bed including Adherent Slough. Wound #3 status is Open. Original cause of wound was Gradually Appeared. The date acquired was: 08/03/2020. The wound is located on the Right  Gluteus. The wound measures 0.3cm length x 0.2cm width x 0.1cm depth; 0.047cm^2 area and 0.005cm^3 volume. There is Fat Layer (Subcutaneous Tissue) exposed. There is no tunneling or undermining noted. There is a medium amount of serosanguineous drainage noted. There is no granulation within the wound bed. There is a large (67-100%) amount of necrotic tissue within the wound bed including Adherent Slough. 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 There was a Excisional Skin/Subcutaneous Tissue Debridement with a total area of 0.35 sq cm performed by Tommie Sams., PA-C. With the following instrument(s): Curette to remove Viable  and Non-Viable tissue/material. Material removed includes Subcutaneous Tissue and Slough and. A time out was conducted at 14:12, prior to the start of the procedure. A Minimum amount of bleeding was controlled with Pressure. The procedure was tolerated well. Post Debridement Measurements: 0.7cm length x 0.5cm width x 0.7cm depth; 0.192cm^3 volume. Character of Wound/Ulcer Post Debridement is stable. Post procedure Diagnosis Wound #1: Same as Pre-Procedure Plan Follow-up Appointments: Wound #1 Right,Proximal Gluteus: Return Appointment in 2 weeks. Wound #2 Left,Distal Gluteus: Return Appointment in 2 weeks. Wound #3 Right Gluteus: Return Appointment in 2 weeks. Off-Loading: Wound #1 Right,Proximal Gluteus: Turn and reposition every 2 hours Wound #2 Left,Distal Gluteus: Turn and reposition every 2 hours Wound #3 Right Gluteus: Turn and reposition every 2 hours WOUND #1: - Gluteus Wound Laterality: Right, Proximal Cleanser: Byram Ancillary Kit - 15 Day Supply (DME) (Generic) 3 x Per Week/30 Days Discharge Instructions: Use supplies as instructed; Kit contains: (15) Saline Bullets; (15) 3x3 Gauze; 15 pr Gloves Cleanser: Normal Saline (DME) (Generic) 3 x Per Week/30 Days Discharge Instructions: Wash your hands with soap and water. Remove old dressing, discard into plastic bag and place into trash. Cleanse the wound with Normal Saline prior to applying a clean dressing using gauze sponges, not tissues or cotton balls. Do not scrub or use excessive force. Pat dry using gauze sponges, not tissue or cotton balls. [name], [name] ([id number]) Primary Dressing: Prisma 4.34 (in) (DME) (Generic) 3 x Per Week/30 Days Discharge Instructions: Moisten w/normal saline or sterile water; Cover wound as directed. Do not remove from wound bed. Primary Dressing: Gauze packing strip 1/4 (in) (DME) (Generic) 3 x Per Week/30 Days Discharge Instructions: Apply Plain Packing Strip 1/4 in as  instructed. Secondary Dressing: Non-Adherent Pad 3x8 (in/in) (DME) (Generic) 3 x Per Week/30 Days Secured With: Tegaderm Film 4x4 (in/in) 3 x Per Week/30 Days Discharge Instructions: Apply to wound bed WOUND #2: - Gluteus Wound Laterality: Left, Distal Cleanser: Normal Saline (DME) (Generic) 3 x Per Week/30 Days Discharge Instructions: Wash your hands with soap and water. Remove old dressing, discard into plastic bag and place into trash. Cleanse the wound with Normal Saline prior to applying a clean dressing using gauze sponges, not tissues or cotton balls. Do not scrub or use excessive force. Pat dry using gauze sponges, not tissue or cotton balls. Primary Dressing: Prisma 4.34 (in) (Generic) 3 x Per Week/30 Days Discharge Instructions: Moisten w/normal saline or sterile water; Cover wound as directed. Do not remove from wound bed. Secondary Dressing: Non-Adherent Pad 3x8 (in/in) (Generic) 3 x Per Week/30 Days Secured With: Tegaderm Film 4x4 (  in/in) 3 x Per Week/30 Days Discharge Instructions: Apply to wound bed WOUND #3: - Gluteus Wound Laterality: Right Cleanser: Normal Saline (Generic) 3 x Per Week/30 Days Discharge Instructions: Wash your hands with soap and water. Remove old dressing, discard into plastic bag and place into trash. Cleanse the wound with Normal Saline prior to applying a clean dressing using gauze sponges, not tissues or cotton balls. Do not scrub or use excessive force. Pat dry using gauze sponges, not tissue or cotton balls. Primary Dressing: Prisma 4.34 (in) (Generic) 3 x Per Week/30 Days Discharge Instructions: Moisten w/normal saline or sterile water; Cover wound as directed. Do not remove from wound bed. Secondary Dressing: Non-Adherent Pad 3x8 (in/in) (DME) (Generic) 3 x Per Week/30 Days Secured With: Tegaderm Film 4x4 (in/in) 3 x Per Week/30 Days Discharge Instructions: Apply to wound bed 1. Would recommend currently that we have the patient go ahead and have  the patient initiate treatment with silver collagen to all wound locations. I think this is good to do a good job for him to be honest. 2. I am also going to recommend that we have him use a small plain packing strip into the deeper wound on the proximal right gluteal location. 3. I am also can recommend that we use a Tegaderm to cover here in the clinic and a Telfa pad underneath this and then subsequently have the patient use next care bandages at home so that he can shower with these in place as he does have to shower daily. 4. I am also can recommend that we have the patient offload appropriately the good news is it sounds like he does have a Roho cushion which is excellent I think those are very beneficial for the situation like he is experiencing at this point. We will see patient back for reevaluation in 1 week here in the clinic. If anything worsens or changes patient will contact our office for additional recommendations. Electronic Signature(s) Signed: 08/26/2020 3:21:42 PM By: Worthy Keeler PA-C Entered By: Worthy Keeler on 08/26/2020 15:21:41 [name], [name] ([id number]) -------------------------------------------------------------------------------- ROS/PFSH Details Patient Name: [name], [name]. Date of Service: 08/26/2020 1:00 PM Medical Record Number: [id number] Patient Account Number: [id number] Date of Birth/Sex: [birth year] (87 y.o. M) Treating RN: Carlene Coria Primary Care Provider: Ezequiel Kayser Other Clinician: Referring Provider: Hinda Kehr Treating Provider/Extender: Skipper Cliche in Treatment: 0 Immunizations Pneumococcal Vaccine: Received Pneumococcal Vaccination: No Implantable Devices Yes Family and Social History Never smoker; Marital Status - Married; Alcohol Use: Rarely; Drug Use: No History; Caffeine Use: Daily; Financial Concerns: No; Food, Clothing or Shelter Needs: No; Support System Lacking: No; Transportation Concerns: No Electronic  Signature(s) Signed: 08/26/2020 5:47:45 PM By: Worthy Keeler PA-C Signed: 08/31/2020 4:52:45 PM By: Carlene Coria RN Entered By: Carlene Coria on 08/26/2020 13:52:56 [name], [name] ([id number]) -------------------------------------------------------------------------------- SuperBill Details Patient Name: [name], [name]. Date of Service: 08/26/2020 Medical Record Number: [id number] Patient Account Number: [id number] Date of Birth/Sex: 1928/10/16 (87 y.o. 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 Facility Procedures CPT4 Code: 15400867 Description: 99213 - WOUND CARE VISIT-LEV 3 EST  PT Modifier: Quantity: 1 CPT4 Code: [id number] Description: 01655 - DEB SUBQ TISSUE 20 SQ CM/< Modifier: Quantity: 1 CPT4 Code: Description: ICD-10 Diagnosis Description L98.412 Non-pressure chronic ulcer of buttock with fat layer exposed Modifier: Quantity: Physician Procedures CPT4 Code: 3748270 Description: WC PHYS LEVEL 3 o NEW PT Modifier: 25 Quantity: 1 CPT4 Code: Description: ICD-10 Diagnosis Description L02.31 Cutaneous abscess of buttock L98.412 Non-pressure chronic ulcer of buttock with fat layer exposed E11.622 Type 2 diabetes mellitus with other skin ulcer I25.10 Atherosclerotic heart disease of native  coronary artery without angina Modifier: pectoris Quantity: CPT4 Code: [id number] Description: 11042 - WC PHYS SUBQ TISS 20 SQ CM Modifier: Quantity: 1 CPT4  Code: Description: ICD-10 Diagnosis Description L98.412 Non-pressure chronic ulcer of buttock with fat layer exposed Modifier: Quantity: Electronic Signature(s) Signed: 08/26/2020 3:30:11 PM By: Worthy Keeler PA-C Entered By: Worthy Keeler on 08/26/2020 15:30:10

## 2020-09-01 NOTE — Progress Notes (Signed)
[name], [name] ([id number]) Visit Report for 08/26/2020 Abuse/Suicide Risk Screen Details Patient Name: [name], [name]. Date of Service: 08/26/2020 1:00 PM Medical Record Number: [id number] Patient Account Number: [id number] Date of Birth/Sex: 1929/01/07 (87 y.o. M) Treating RN: Yevonne Pax Primary Care Cheyna Retana: Mickey Farber Other Clinician: Referring Haani Bakula: Loleta Rose Treating Dai Mcadams/Extender: Rowan Blase in Treatment: 0 Abuse/Suicide Risk Screen Items Answer ABUSE RISK SCREEN: Has anyone close to you tried to hurt or harm you recentlyo No Do you feel uncomfortable with anyone in your familyo No Has anyone forced you do things that you didnot want to doo No Electronic Signature(s) Signed: 08/31/2020 4:52:45 PM By: Yevonne Pax RN Entered By: Yevonne Pax on 08/26/2020 13:53:05 Credit, [name] ([id number]) -------------------------------------------------------------------------------- Activities of Daily Living Details Patient Name: [name], [name]. Date of Service: 08/26/2020 1:00 PM Medical Record Number: [id number] Patient Account Number: [id number] Date of Birth/Sex: 1929/02/03 (87 y.o. M) Treating RN: Yevonne Pax Primary Care Elnora Quizon: Mickey Farber Other Clinician: Referring Kham Zuckerman: Loleta Rose Treating Pankaj Haack/Extender: Rowan Blase in Treatment: 0 Activities of Daily Living Items Answer Activities of Daily Living (Please select one for each item) Drive Automobile Not Able Take Medications Completely Able Use Telephone Completely Able Care for Appearance Completely Able Use Toilet Completely Able Bath / Shower Completely Able Dress Self Completely Able Feed Self Completely Able Walk Completely Able Get In / Out Bed Completely Able Housework Completely Able Prepare Meals Completely Able Handle Money Completely Able Shop for Self Completely Able Electronic Signature(s) Signed: 08/31/2020 4:52:45 PM By: Yevonne Pax RN Entered By:  Yevonne Pax on 08/26/2020 13:53:38 Lambert, [name] ([id number]) -------------------------------------------------------------------------------- Education Screening Details Patient Name: [name], [name]. Date of Service: 08/26/2020 1:00 PM Medical Record Number: [id number] Patient Account Number: [id number] Date of Birth/Sex: [birth year] (87 y.o. M) Treating RN: Yevonne Pax Primary Care Auriah Hollings: Mickey Farber Other Clinician: Referring Alayza Pieper: Loleta Rose Treating Corena Tilson/Extender: Rowan Blase in Treatment: 0 Learning Preferences/Education Level/Primary Language Learning Preference: Explanation Highest Education Level: College or Above Preferred Language: English Cognitive Barrier Language Barrier: No Translator Needed: No Memory Deficit: No Emotional Barrier: No Cultural/Religious Beliefs Affecting Medical Care: No Physical Barrier Impaired Vision: No Impaired Hearing: No Decreased Hand dexterity: No Knowledge/Comprehension Knowledge Level: Medium Comprehension Level: High Ability to understand written instructions: High Ability to understand verbal instructions: High Motivation Anxiety Level: Anxious Cooperation: Cooperative Education Importance: Acknowledges Need Interest in Health Problems: Asks Questions Perception: Coherent Willingness to Engage in Self-Management High Activities: Readiness to Engage in Self-Management High Activities: Electronic Signature(s) Signed: 08/31/2020 4:52:45 PM By: Yevonne Pax RN Entered By: Yevonne Pax on 08/26/2020 13:54:06 Kinkaid, [name] ([id number]) -------------------------------------------------------------------------------- Fall Risk Assessment Details Patient Name: Jeremy Keens. Date of Service: 08/26/2020 1:00 PM Medical Record Number: [id number] Patient Account Number: [id number] Date of Birth/Sex: 1929/04/21 (87 y.o. M) Treating RN: Yevonne Pax Primary Care Shaana Acocella: Mickey Farber Other  Clinician: Referring Kashtyn Jankowski: Loleta Rose Treating Evarose Altland/Extender: Rowan Blase in Treatment: 0 Fall Risk Assessment Items Have you had 2 or more falls in the last 12 monthso 0 No Have you had any fall that resulted in injury in the last 12 monthso 0 No FALLS RISK SCREEN History of falling - immediate or within 3 months 0 No Secondary diagnosis (Do you have 2 or more medical diagnoseso) 0 No Ambulatory aid None/bed rest/wheelchair/nurse 0 No Crutches/cane/walker 0 No Furniture 0 No Intravenous therapy Access/Saline/Heparin Lock 0 No Gait/Transferring Normal/ bed rest/ wheelchair 0 No Weak (short steps with or without shuffle, stooped  but able to lift head while walking, may 0 No seek support from furniture) Impaired (short steps with shuffle, may have difficulty arising from chair, head down, impaired 0 No balance) Mental Status Oriented to own ability 0 No Electronic Signature(s) Signed: 08/31/2020 4:52:45 PM By: Yevonne Pax RN Entered By: Yevonne Pax on 08/26/2020 13:54:48 Jarvie, [name] ([id number]) -------------------------------------------------------------------------------- Foot Assessment Details Patient Name: [name], LEA. Date of Service: 08/26/2020 1:00 PM Medical Record Number: [id number] Patient Account Number: [id number] Date of Birth/Sex: 1928-07-24 (87 y.o. M) Treating RN: Yevonne Pax Primary Care Jennae Hakeem: Mickey Farber Other Clinician: Referring Julya Alioto: Loleta Rose Treating Tionna Gigante/Extender: Rowan Blase in Treatment: 0 Foot Assessment Items Site Locations + = Sensation present, - = Sensation absent, C = Callus, U = Ulcer R = Redness, W = Warmth, M = Maceration, PU = Pre-ulcerative lesion F = Fissure, S = Swelling, D = Dryness Assessment Right: Left: Other Deformity: No No Prior Foot Ulcer: No No Prior Amputation: No No Charcot Joint: No No Ambulatory Status: Ambulatory Without Help Gait: Unsteady Electronic  Signature(s) Signed: 08/31/2020 4:52:45 PM By: Yevonne Pax RN Entered By: Yevonne Pax on 08/26/2020 13:55:13 Schmierer, [name] ([id number]) -------------------------------------------------------------------------------- Nutrition Risk Screening Details Patient Name: [name], [name]. Date of Service: 08/26/2020 1:00 PM Medical Record Number: [id number] Patient Account Number: [id number] Date of Birth/Sex: January 29, 1929 (87 y.o. M) Treating RN: Yevonne Pax Primary Care Aneli Zara: Mickey Farber Other Clinician: Referring Tegan Burnside: Loleta Rose Treating Charlii Yost/Extender: Rowan Blase in Treatment: 0 Height (in): 70 Weight (lbs): 170 Body Mass Index (BMI): 24.4 Nutrition Risk Screening Items Score Screening NUTRITION RISK SCREEN: I have an illness or condition that made me change the kind and/or amount of food I eat 0 No I eat fewer than two meals per day 0 No I eat few fruits and vegetables, or milk products 0 No I have three or more drinks of beer, liquor or wine almost every day 0 No I have tooth or mouth problems that make it hard for me to eat 0 No I don't always have enough money to buy the food I need 0 No I eat alone most of the time 0 No I take three or more different prescribed or over-the-counter drugs a day 1 Yes Without wanting to, I have lost or gained 10 pounds in the last six months 0 No I am not always physically able to shop, cook and/or feed myself 0 No Nutrition Protocols Good Risk Protocol 0 No interventions needed Moderate Risk Protocol High Risk Proctocol Risk Level: Good Risk Score: 1 Electronic Signature(s) Signed: 08/31/2020 4:52:45 PM By: Yevonne Pax RN Entered By: Yevonne Pax on 08/26/2020 13:55:00

## 2020-09-09 ENCOUNTER — Other Ambulatory Visit: Payer: Self-pay

## 2020-09-09 ENCOUNTER — Encounter: Payer: Medicare Other | Attending: Physician Assistant | Admitting: Physician Assistant

## 2020-09-09 DIAGNOSIS — L98412 Non-pressure chronic ulcer of buttock with fat layer exposed: Secondary | ICD-10-CM | POA: Diagnosis not present

## 2020-09-09 DIAGNOSIS — I2510 Atherosclerotic heart disease of native coronary artery without angina pectoris: Secondary | ICD-10-CM | POA: Insufficient documentation

## 2020-09-09 DIAGNOSIS — I5042 Chronic combined systolic (congestive) and diastolic (congestive) heart failure: Secondary | ICD-10-CM | POA: Insufficient documentation

## 2020-09-09 DIAGNOSIS — E11622 Type 2 diabetes mellitus with other skin ulcer: Secondary | ICD-10-CM | POA: Diagnosis not present

## 2020-09-09 DIAGNOSIS — L0231 Cutaneous abscess of buttock: Secondary | ICD-10-CM | POA: Insufficient documentation

## 2020-09-09 NOTE — Progress Notes (Signed)
MOSE, COLAIZZI ([id number]) Visit Report for 09/09/2020 Arrival Information Details Patient Name: [name], [name]. Date of Service: 09/09/2020 10:30 AM Medical Record Number: [id number] Patient Account Number: [id number] Date of Birth/Sex: May 05, 1929 (87 y.o. M) Treating RN: Carlene Coria Primary Care Moani Weipert: Ezequiel Kayser Other Clinician: Jeanine Luz Referring Dakotah Orrego: Ezequiel Kayser Treating Gurvir Schrom/Extender: Skipper Cliche in Treatment: 2 Visit Information History Since Last Visit All ordered tests and consults were completed: No Patient Arrived: Gilford Rile Added or deleted any medications: No Arrival Time: 10:36 Any new allergies or adverse reactions: No Accompanied By: self Had a fall or experienced change in No Transfer Assistance: None activities of daily living that may affect Patient Identification Verified: Yes risk of falls: Secondary Verification Process Completed: Yes Signs or symptoms of abuse/neglect since last visito No Patient Requires Transmission-Based Precautions: No Hospitalized since last visit: No Patient Has Alerts: No Implantable device outside of the clinic excluding No cellular tissue based products placed in the center since last visit: Has Dressing in Place as Prescribed: Yes Pain Present Now: No Electronic Signature(s) Signed: 09/09/2020 1:29:26 PM By: Carlene Coria RN Entered By: Carlene Coria on 09/09/2020 10:36:59 Kress, Shelda Altes ([id number]) -------------------------------------------------------------------------------- Clinic Level of Care Assessment Details Patient Name: [name]. Date of Service: 09/09/2020 10:30 AM Medical Record Number: [id number] Patient Account Number: [id number] Date of Birth/Sex: Apr 25, 1929 (87 y.o. M) Treating RN: Dolan Amen Primary Care Nolen Lindamood: Ezequiel Kayser Other Clinician: Jeanine Luz Referring Jonah Nestle: Ezequiel Kayser Treating Johnae Friley/Extender: Skipper Cliche in Treatment:  2 Clinic Level of Care Assessment Items TOOL 1 Quantity Score _0  - Use when EandM and Procedure is performed on INITIAL visit 0 ASSESSMENTS - Nursing Assessment / Reassessment _1  - General Physical Exam (combine w/ comprehensive assessment (listed just below) when performed on new 0 pt. evals) _2  - 0 Comprehensive Assessment (HX, ROS, Risk Assessments, Wounds Hx, etc.) ASSESSMENTS - Wound and Skin Assessment / Reassessment _3  - Dermatologic / Skin Assessment (not related to wound area) 0 ASSESSMENTS - Ostomy and/or Continence Assessment and Care _4  - Incontinence Assessment and Management 0 _5  - 0 Ostomy Care Assessment and Management (repouching, etc.) PROCESS - Coordination of Care _6  - Simple Patient / Family Education for ongoing care 0 _7  - 0 Complex (extensive) Patient / Family Education for ongoing care _8  - 0 Staff obtains Programmer, systems, Records, Test Results / Process Orders _9  - 0 Staff telephones HHA, Nursing Homes / Clarify orders / etc _10  - 0 Routine Transfer to another Facility (non-emergent condition) _11  - 0 Routine Hospital Admission (non-emergent condition) _12  - 0 New Admissions / Biomedical engineer / Ordering NPWT, Apligraf, etc. _13  - 0 Emergency Hospital Admission (emergent condition) PROCESS - Special Needs _14  - Pediatric / Minor Patient Management 0 _15  - 0 Isolation Patient Management _16  - 0 Hearing / Language / Visual special needs _17  - 0 Assessment of Community assistance (transportation, D/C planning, etc.) _18  - 0 Additional assistance / Altered mentation _19  - 0 Support Surface(s) Assessment (bed, cushion, seat, etc.) INTERVENTIONS - Miscellaneous _20  - External ear exam 0 _21  - 0 Patient Transfer (multiple staff / Civil Service fast streamer / Similar devices) _22  - 0 Simple Staple / Suture removal (25 or less) _23  - 0 Complex Staple / Suture removal (26 or more) _24  - 0 Hypo/Hyperglycemic Management (do not check if billed separately) _25  - 0 Ankle /  Brachial Index (ABI) - do not check if billed separately Has the patient been seen at the hospital within the last three years: Yes  Total Score: 0 Level Of Care: ____ [name] ([id number]) Electronic Signature(s) Signed: 09/09/2020 4:13:11 PM By: [name], [name] RN Entered By: [name], [name] on 09/09/2020 11:18:56 Janowski, Shelda Altes ([id number]) -------------------------------------------------------------------------------- Encounter Discharge Information Details Patient Name: [name]. Date of Service: 09/09/2020 10:30 AM Medical Record Number: [id number] Patient Account Number: [id number] Date of Birth/Sex: 1928/12/26 (87 y.o. M) Treating RN: Dolan Amen Primary Care Angelli Baruch: Ezequiel Kayser Other Clinician: Jeanine Luz Referring Kyro Joswick: Ezequiel Kayser Treating Evy Lutterman/Extender: Skipper Cliche in Treatment: 2 Encounter Discharge Information Items Post Procedure Vitals Discharge Condition: Stable Temperature (F): 97.7 Ambulatory Status: Walker Pulse (bpm): 86 Discharge Destination: Home Respiratory Rate (breaths/min): 18 Transportation: Private Auto Blood Pressure (mmHg): 86/54 Accompanied By: daughter in law Schedule Follow-up Appointment: Yes Clinical Summary of Care: Electronic Signature(s) Signed: 09/09/2020 4:13:11 PM By: [name], [name] RN Entered By: [name], [name] on 09/09/2020 11:21:47 [name] ([id number]) -------------------------------------------------------------------------------- Lower Extremity Assessment Details Patient Name: MEMPHIS, DECOTEAU. Date of Service: 09/09/2020 10:30 AM Medical Record Number: [id number] Patient Account Number: [id number] Date of Birth/Sex: [birth year] (87 y.o. M) Treating RN: Carlene Coria Primary Care Shateria Paternostro: Ezequiel Kayser Other Clinician: Jeanine Luz Referring Joquan Lotz: Ezequiel Kayser Treating Anitria Andon/Extender: Skipper Cliche in Treatment: 2 Electronic  Signature(s) Signed: 09/09/2020 1:29:26 PM By: Carlene Coria RN Entered By: Carlene Coria on 09/09/2020 10:53:53 Osmer, Shelda Altes ([id number]) -------------------------------------------------------------------------------- Multi Wound Chart Details Patient Name: TYRECE, VANTERPOOL. Date of Service: 09/09/2020 10:30 AM Medical Record Number: [id number] Patient Account Number: [id number] Date of Birth/Sex: December 31, 1928 (87 y.o. M) Treating RN: Dolan Amen Primary Care Tayvia Faughnan: Ezequiel Kayser Other Clinician: Jeanine Luz Referring Boe Deans: Ezequiel Kayser Treating Norina Cowper/Extender: Skipper Cliche in Treatment: 2 Vital Signs Height(in): 70 Pulse(bpm): 65 Weight(lbs): 170 Blood Pressure(mmHg): 86/54 Body Mass Index(BMI): 24 Temperature(F): 97.7 Respiratory Rate(breaths/min): 18 Photos: Wound Location: Right, Proximal Gluteus Left, Distal Gluteus Right Gluteus Wounding Event: Gradually Appeared Gradually Appeared Gradually Appeared Primary Etiology: Abscess Abscess Abscess Date Acquired: 08/03/2020 08/03/2020 08/03/2020 Weeks of Treatment: _0 Wound Status: Open Open Open Measurements L x W x D (cm) 0.5x0.5x1 0x0x0 0.4x0.7x0.1 Area (cm) : 0.196 0 0.22 Volume (cm) : 0.196 0 0.022 % Reduction in Area: 28.70% 100.00% -368.10% % Reduction in Volume: -139.00% 100.00% -340.00% Classification: Full Thickness Without Exposed Full Thickness Without Exposed Full Thickness Without Exposed Support Structures Support Structures Support Structures Exudate Amount: Medium None Present Medium Exudate Type: Serosanguineous N/A Serosanguineous Exudate Color: red, brown N/A red, brown Granulation Amount: Small (1-33%) None Present (0%) None Present (0%) Granulation Quality: Pink N/A N/A Necrotic Amount: Large (67-100%) None Present (0%) Large (67-100%) Exposed Structures: Fascia: No Fascia: No Fat Layer (Subcutaneous Tissue): Fat Layer (Subcutaneous Tissue): Fat Layer (Subcutaneous Tissue):  Yes No No Fascia: No Tendon: No Tendon: No Tendon: No Muscle: No Muscle: No Muscle: No Joint: No Joint: No Joint: No Bone: No Bone: No Bone: No Epithelialization: None None None Treatment Notes Electronic Signature(s) Signed: 09/09/2020 4:13:11 PM By: [name], [name] RN Entered By: [name], [name] on 09/09/2020 11:13:17 Wisniewski, Shelda Altes ([id number]) -------------------------------------------------------------------------------- Multi-Disciplinary Care Plan Details Patient Name: BETZALEL, UMBARGER. Date of Service: 09/09/2020 10:30 AM Medical Record Number: [id number] Patient Account Number: [id number] Date of Birth/Sex: [birth year] (87 y.o. M) Treating RN: Dolan Amen Primary Care Hershal Eriksson: Ezequiel Kayser Other Clinician: Jeanine Luz Referring Jaxson Keener: Ezequiel Kayser Treating Vane Yapp/Extender: Skipper Cliche in Treatment: 2 Active Inactive Wound/Skin Impairment Nursing Diagnoses: Impaired tissue integrity Goals: Patient/caregiver will verbalize understanding of skin care  regimen Date Initiated: 08/26/2020 Date Inactivated: 09/09/2020 Target Resolution Date: 08/26/2020 Goal Status: Met Ulcer/skin breakdown will have a volume reduction of 30% by week 4 Date Initiated: 08/26/2020 Target Resolution Date: 09/23/2020 Goal Status: Active Ulcer/skin breakdown will have a volume reduction of 50% by week 8 Date Initiated: 08/26/2020 Target Resolution Date: 10/24/2020 Goal Status: Active Ulcer/skin breakdown will have a volume reduction of 80% by week 12 Date Initiated: 08/26/2020 Target Resolution Date: 11/23/2020 Goal Status: Active Interventions: Assess patient/caregiver ability to obtain necessary supplies Assess patient/caregiver ability to perform ulcer/skin care regimen upon admission and as needed Assess ulceration(s) every visit Provide education on ulcer and skin care Treatment Activities: Skin care regimen initiated : 08/26/2020 Topical wound  management initiated : 08/26/2020 Notes: Electronic Signature(s) Signed: 09/09/2020 4:13:11 PM By: [name], [name] RN Entered By: [name], [name] on 09/09/2020 11:13:05 [name] ([id number]) -------------------------------------------------------------------------------- Pain Assessment Details Patient Name: KEONTE, DAUBENSPECK. Date of Service: 09/09/2020 10:30 AM Medical Record Number: [id number] Patient Account Number: [id number] Date of Birth/Sex: [birth year] (87 y.o. M) Treating RN: Carlene Coria Primary Care Zadyn Yardley: Ezequiel Kayser Other Clinician: Jeanine Luz Referring Naiah Donahoe: Ezequiel Kayser Treating Janes Colegrove/Extender: Skipper Cliche in Treatment: 2 Active Problems Location of Pain Severity and Description of Pain Patient Has Paino No Site Locations Pain Management and Medication Current Pain Management: Electronic Signature(s) Signed: 09/09/2020 1:29:26 PM By: Carlene Coria RN Entered By: Carlene Coria on 09/09/2020 10:37:39 Dory, Shelda Altes ([id number]) -------------------------------------------------------------------------------- Patient/Caregiver Education Details Patient Name: ZUHAYR, DEENEY. Date of Service: 09/09/2020 10:30 AM Medical Record Number: [id number] Patient Account Number: [id number] Date of Birth/Gender: March 18, 1929 (87 y.o. M) Treating RN: Dolan Amen Primary Care Physician: Ezequiel Kayser Other Clinician: Jeanine Luz Referring Physician: Ezequiel Kayser Treating Physician/Extender: Skipper Cliche in Treatment: 2 Education Assessment Education Provided To: Patient and Caregiver daughter in law Education Topics Provided Wound/Skin Impairment: Methods: Explain/Verbal Responses: State content correctly Electronic Signature(s) Signed: 09/09/2020 4:13:11 PM By: [name], [name] RN Entered By: [name], [name] on 09/09/2020 11:19:27 VADEN, BECHERER  ([id number]) -------------------------------------------------------------------------------- Wound Assessment Details Patient Name: IANN, RODIER. Date of Service: 09/09/2020 10:30 AM Medical Record Number: [id number] Patient Account Number: [id number] Date of Birth/Sex: 22-Sep-1928 (87 y.o. M) Treating RN: Carlene Coria Primary Care Crispin Vogel: Ezequiel Kayser Other Clinician: Jeanine Luz Referring Adiyah Lame: Ezequiel Kayser Treating Amyrie Illingworth/Extender: Skipper Cliche in Treatment: 2 Wound Status Wound Number: 1 Primary Etiology: Abscess Wound Location: Right, Proximal Gluteus Wound Status: Open Wounding Event: Gradually Appeared Date Acquired: 08/03/2020 Weeks Of Treatment: 2 Clustered Wound: No Photos Wound Measurements Length: (cm) 0.5 Width: (cm) 0.5 Depth: (cm) 1 Area: (cm) 0.196 Volume: (cm) 0.196 % Reduction in Area: 28.7% % Reduction in Volume: -139% Epithelialization: None Tunneling: No Undermining: No Wound Description Classification: Full Thickness Without Exposed Support Structures Exudate Amount: Medium Exudate Type: Serosanguineous Exudate Color: red, brown Foul Odor After Cleansing: No Slough/Fibrino Yes Wound Bed Granulation Amount: Small (1-33%) Exposed Structure Granulation Quality: Pink Fascia Exposed: No Necrotic Amount: Large (67-100%) Fat Layer (Subcutaneous Tissue) Exposed: No Necrotic Quality: Adherent Slough Tendon Exposed: No Muscle Exposed: No Joint Exposed: No Bone Exposed: No Treatment Notes Wound #1 (Gluteus) Wound Laterality: Right, Proximal Cleanser Normal Saline Discharge Instruction: Wash your hands with soap and water. Remove old dressing, discard into plastic bag and place into trash. Cleanse the wound with Normal Saline prior to applying a clean dressing using gauze sponges, not tissues or cotton balls. Do not scrub or use excessive force. Pat dry using gauze sponges,  not tissue or cotton balls. CAIDON, FOTI  ([id number]) Peri-Wound Care Skin Prep Discharge Instruction: Use skin prep as directed Topical Primary Dressing Iodoform 1/4x 5 (in/yd) Discharge Instruction: Apply Iodoform Packing Strip as instructed. Prisma 4.34 (in) Discharge Instruction: Moisten w/normal saline or sterile water; Cover wound as directed. Do not remove from wound bed. Secondary Dressing Non-Adherent Pad 3x8 (in/in) Secured With Tegaderm Film 4x4 (in/in) Discharge Instruction: Apply to wound bed Compression Wrap Compression Stockings Add-Ons Electronic Signature(s) Signed: 09/09/2020 1:29:26 PM By: Carlene Coria RN Entered By: Carlene Coria on 09/09/2020 10:50:28 Keeran, Shelda Altes ([id number]) -------------------------------------------------------------------------------- Wound Assessment Details Patient Name: MIKYLE, SOX. Date of Service: 09/09/2020 10:30 AM Medical Record Number: [id number] Patient Account Number: [id number] Date of Birth/Sex: Aug 21, 1928 (87 y.o. M) Treating RN: Carlene Coria Primary Care Kaynen Minner: Ezequiel Kayser Other Clinician: Jeanine Luz Referring Melvin Whiteford: Ezequiel Kayser Treating Rani Sisney/Extender: Skipper Cliche in Treatment: 2 Wound Status Wound Number: 2 Primary Etiology: Abscess Wound Location: Left, Distal Gluteus Wound Status: Healed - Epithelialized Wounding Event: Gradually Appeared Date Acquired: 08/03/2020 Weeks Of Treatment: 2 Clustered Wound: No Photos Wound Measurements Length: (cm) 0 Width: (cm) 0 Depth: (cm) 0 Area: (cm) 0 Volume: (cm) 0 % Reduction in Area: 100% % Reduction in Volume: 100% Epithelialization: Large (67-100%) Tunneling: No Undermining: No Wound Description Classification: Full Thickness Without Exposed Support Structure Exudate Amount: None Present s Foul Odor After Cleansing: No Slough/Fibrino No Wound Bed Granulation Amount: None Present (0%) Exposed Structure Necrotic Amount: None Present (0%) Fascia Exposed: No Fat Layer  (Subcutaneous Tissue) Exposed: No Tendon Exposed: No Muscle Exposed: No Joint Exposed: No Bone Exposed: No Electronic Signature(s) Signed: 09/09/2020 1:29:26 PM By: Carlene Coria RN Signed: 09/09/2020 4:13:11 PM By: [name], [name] RN Entered By: [name], [name] on 09/09/2020 11:13:43 Patriarca, Shelda Altes ([id number]) -------------------------------------------------------------------------------- Wound Assessment Details Patient Name: SHINE, MIKES. Date of Service: 09/09/2020 10:30 AM Medical Record Number: [id number] Patient Account Number: [id number] Date of Birth/Sex: 12/26/1928 (87 y.o. M) Treating RN: Carlene Coria Primary Care Drevion Offord: Ezequiel Kayser Other Clinician: Jeanine Luz Referring Malita Ignasiak: Ezequiel Kayser Treating Mabell Esguerra/Extender: Skipper Cliche in Treatment: 2 Wound Status Wound Number: 3 Primary Etiology: Abscess Wound Location: Right Gluteus Wound Status: Open Wounding Event: Gradually Appeared Date Acquired: 08/03/2020 Weeks Of Treatment: 2 Clustered Wound: No Photos Wound Measurements Length: (cm) 0.4 Width: (cm) 0.7 Depth: (cm) 0.1 Area: (cm) 0.22 Volume: (cm) 0.022 % Reduction in Area: -368.1% % Reduction in Volume: -340% Epithelialization: None Tunneling: No Undermining: No Wound Description Classification: Full Thickness Without Exposed Support Structures Exudate Amount: Medium Exudate Type: Serosanguineous Exudate Color: red, brown Foul Odor After Cleansing: No Slough/Fibrino Yes Wound Bed Granulation Amount: None Present (0%) Exposed Structure Necrotic Amount: Large (67-100%) Fascia Exposed: No Necrotic Quality: Adherent Slough Fat Layer (Subcutaneous Tissue) Exposed: Yes Tendon Exposed: No Muscle Exposed: No Joint Exposed: No Bone Exposed: No Treatment Notes Wound #3 (Gluteus) Wound Laterality: Right Cleanser Normal Saline Discharge Instruction: Wash your hands with soap and water. Remove old dressing, discard  into plastic bag and place into trash. Cleanse the wound with Normal Saline prior to applying a clean dressing using gauze sponges, not tissues or cotton balls. Do not scrub or use excessive force. Pat dry using gauze sponges, not tissue or cotton balls. MARQUEZ, CEESAY ([id number]) Peri-Wound Care Topical Primary Dressing Iodoform 1/4x 5 (in/yd) Discharge Instruction: Apply Iodoform Packing Strip as instructed. Prisma 4.34 (in) Discharge Instruction: Moisten w/normal saline or sterile water; Cover wound as directed. Do not  remove from wound bed. Secondary Dressing Non-Adherent Pad 3x8 (in/in) Secured With Tegaderm Film 4x4 (in/in) Discharge Instruction: Apply to wound bed Compression Wrap Compression Stockings Add-Ons Electronic Signature(s) Signed: 09/09/2020 1:29:26 PM By: Carlene Coria RN Entered By: Carlene Coria on 09/09/2020 10:53:32 Jocson, Shelda Altes ([id number]) -------------------------------------------------------------------------------- Vitals Details Patient Name: KEZIAH, DROTAR. Date of Service: 09/09/2020 10:30 AM Medical Record Number: [id number] Patient Account Number: [id number] Date of Birth/Sex: [birth year] (87 y.o. M) Treating RN: Carlene Coria Primary Care Henna Derderian: Ezequiel Kayser Other Clinician: Jeanine Luz Referring Kynnedi Zweig: Ezequiel Kayser Treating Marlon Vonruden/Extender: Skipper Cliche in Treatment: 2 Vital Signs Time Taken: 10:37 Temperature (F): 97.7 Height (in): 70 Pulse (bpm): 86 Weight (lbs): 170 Respiratory Rate (breaths/min): 18 Body Mass Index (BMI): 24.4 Blood Pressure (mmHg): 86/54 Reference Range: 80 - 120 mg / dl Electronic Signature(s) Signed: 09/09/2020 1:29:26 PM By: Carlene Coria RN Entered By: Carlene Coria on 09/09/2020 10:37:25

## 2020-09-09 NOTE — Progress Notes (Addendum)
SABIN, GIBEAULT ([id number]) Visit Report for 09/09/2020 Chief Complaint Document Details Patient Name: [name], [name]. Date of Service: 09/09/2020 10:30 AM Medical Record Number: [id number] Patient Account Number: [id number] Date of Birth/Sex: [birth year] (87 y.o. M) Treating RN: Rogers Blocker Primary Care Provider: Mickey Farber Other Clinician: Lolita Cram Referring Provider: Mickey Farber Treating Provider/Extender: Rowan Blase in Treatment: 2 Information Obtained from: Patient Chief Complaint Bilateral gluteal abscesses Electronic Signature(s) Signed: 09/09/2020 11:06:46 AM By: Lenda Kelp PA-C Entered By: Lenda Kelp on 09/09/2020 11:06:46 Galluzzo, [name] ([id number]) -------------------------------------------------------------------------------- Debridement Details Patient Name: [name]. Date of Service: 09/09/2020 10:30 AM Medical Record Number: [id number] Patient Account Number: [id number] Date of Birth/Sex: 12-Apr-1929 (87 y.o. M) Treating RN: Rogers Blocker Primary Care Provider: Mickey Farber Other Clinician: Lolita Cram Referring Provider: Mickey Farber Treating Provider/Extender: Rowan Blase in Treatment: 2 Debridement Performed for Wound #1 Right,Proximal Gluteus Assessment: Performed By: Physician Nelida Meuse., PA-C Debridement Type: Debridement Level of Consciousness (Pre- Awake and Alert procedure): Pre-procedure Verification/Time Out Yes - 11:15 Taken: Start Time: 11:15 Total Area Debrided (L x W): 0.5 (cm) x 0.5 (cm) = 0.25 (cm) Tissue and other material Viable, Non-Viable, Slough, Subcutaneous, Slough debrided: Level: Skin/Subcutaneous Tissue Debridement Description: Excisional Instrument: Curette Bleeding: Minimum Hemostasis Achieved: Pressure Response to Treatment: Procedure was tolerated well Level of Consciousness (Post- Awake and Alert procedure): Post Debridement Measurements of Total  Wound Length: (cm) 0.5 Width: (cm) 0.5 Depth: (cm) 1.1 Volume: (cm) 0.216 Character of Wound/Ulcer Post Debridement: Stable Post Procedure Diagnosis Same as Pre-procedure Electronic Signature(s) Signed: 09/09/2020 2:31:37 PM By: Lenda Kelp PA-C Signed: 09/09/2020 4:13:11 PM By: Phillis Haggis, Dondra Prader RN Entered By: Phillis Haggis, Dondra Prader on 09/09/2020 11:16:34 Brizzi, [name] ([id number]) -------------------------------------------------------------------------------- HPI Details Patient Name: [name]. Date of Service: 09/09/2020 10:30 AM Medical Record Number: [id number] Patient Account Number: [id number] Date of Birth/Sex: [birth year] (87 y.o. M) Treating RN: Rogers Blocker Primary Care Provider: Mickey Farber Other Clinician: Lolita Cram Referring Provider: Mickey Farber Treating Provider/Extender: Rowan Blase in Treatment: 2 History of Present Illness HPI Description: 08/26/2020 upon evaluation today patient presents for initial inspection here in our clinic concerning issues he is been having with his gluteal region. He has several abscesses 2 on the right 1 on the left that have been present since at least December. With that being said triamcinolone and mupirocin has been used currently along with Desitin unfortunately this just does not seem to be making the progress that they would like to see. Obviously I think that the patient is not really showing any signs of infection right now which is good he was on doxycycline as prescribed by urgent care until he was seen in the ER in hospital where they apparently stopped this. Patient does have diabetes. He does also have a history of coronary artery disease. Subsequently his troponin was elevated in the hospital that is part of the reason why they kept him for analysis while he was there. Otherwise the patient seems to be doing well and I think he has a good chance of getting this to heal with appropriate  treatment. 09/09/2020 upon evaluation today patient actually appears to be making good progress here with regard to his wounds. Fortunately there is no signs of active infection at this time. No fevers, chills, nausea, vomiting, or diarrhea. He has been tolerating the dressing changes without complication and his daughter-in-law is doing an excellent job taking care of this. Electronic Signature(s) Signed: 09/09/2020  11:21:04 AM By: Lenda Kelp PA-C Entered By: Lenda Kelp on 09/09/2020 11:21:04 Norberta Keens ([id number]) -------------------------------------------------------------------------------- Physical Exam Details Patient Name: Wallace, [name]. Date of Service: 09/09/2020 10:30 AM Medical Record Number: [id number] Patient Account Number: [id number] Date of Birth/Sex: September 15, 1928 (87 y.o. M) Treating RN: Rogers Blocker Primary Care Provider: Mickey Farber Other Clinician: Lolita Cram Referring Provider: Mickey Farber Treating Provider/Extender: Rowan Blase in Treatment: 2 Constitutional Well-nourished and well-hydrated in no acute distress. Respiratory normal breathing without difficulty. Psychiatric this patient is able to make decisions and demonstrates good insight into disease process. Alert and Oriented x 3. pleasant and cooperative. Notes Patient's wound bed showed signs of good granulation epithelization there was a little bit of slough noted on the surface of the wounds I did perform sharp debridement to clear this away today he tolerated that without complication and post debridement wound beds appear to be doing much better which is great news. Electronic Signature(s) Signed: 09/09/2020 11:21:21 AM By: Lenda Kelp PA-C Entered By: Lenda Kelp on 09/09/2020 11:21:20 MAEL, DELAP ([id number]) -------------------------------------------------------------------------------- Physician Orders Details Patient Name: Wallace, [name]. Date  of Service: 09/09/2020 10:30 AM Medical Record Number: [id number] Patient Account Number: [id number] Date of Birth/Sex: 1928-12-01 (87 y.o. M) Treating RN: Rogers Blocker Primary Care Provider: Mickey Farber Other Clinician: Lolita Cram Referring Provider: Mickey Farber Treating Provider/Extender: Rowan Blase in Treatment: 2 Verbal / Phone Orders: No Diagnosis Coding ICD-10 Coding Code Description L02.31 Cutaneous abscess of buttock L98.412 Non-pressure chronic ulcer of buttock with fat layer exposed E11.622 Type 2 diabetes mellitus with other skin ulcer I25.10 Atherosclerotic heart disease of native coronary artery without angina pectoris I50.42 Chronic combined systolic (congestive) and diastolic (congestive) heart failure Follow-up Appointments Wound #1 Right,Proximal Gluteus o Return Appointment in 2 weeks. Wound #3 Right Gluteus o Return Appointment in 2 weeks. Non-Wound Condition Other Extremity - Left glut. o Additional non-wound orders/instructions: - Zinc as protective barrier Off-Loading Wound #1 Right,Proximal Gluteus o Turn and reposition every 2 hours Wound #3 Right Gluteus o Turn and reposition every 2 hours Wound Treatment Wound #1 - Gluteus Wound Laterality: Right, Proximal Cleanser: Normal Saline 1 x Per Day/30 Days Discharge Instructions: Wash your hands with soap and water. Remove old dressing, discard into plastic bag and place into trash. Cleanse the wound with Normal Saline prior to applying a clean dressing using gauze sponges, not tissues or cotton balls. Do not scrub or use excessive force. Pat dry using gauze sponges, not tissue or cotton balls. Peri-Wound Care: Skin Prep 1 x Per Day/30 Days Discharge Instructions: Use skin prep as directed Primary Dressing: Iodoform 1/4x 5 (in/yd) 1 x Per Day/30 Days Discharge Instructions: Apply Iodoform Packing Strip as instructed. Primary Dressing: Prisma 4.34 (in) 1 x Per Day/30 Days Discharge  Instructions: Moisten w/normal saline or sterile water; Cover wound as directed. Do not remove from wound bed. Secondary Dressing: Non-Adherent Pad 3x8 (in/in) 1 x Per Day/30 Days Secured With: Tegaderm Film 4x4 (in/in) 1 x Per Day/30 Days Discharge Instructions: Apply to wound bed Wound #3 - Gluteus Wound Laterality: Right Cleanser: Normal Saline 1 x Per Day/30 Days Discharge Instructions: Wash your hands with soap and water. Remove old dressing, discard into plastic bag and place into trash. Cleanse the wound with Normal Saline prior to applying a clean dressing using gauze sponges, not tissues or cotton balls. Do not scrub or use excessive force. Pat dry using gauze sponges, not tissue or cotton balls. Coiro,  [name] ([id number]) Primary Dressing: Iodoform 1/4x 5 (in/yd) 1 x Per Day/30 Days Discharge Instructions: Apply Iodoform Packing Strip as instructed. Primary Dressing: Prisma 4.34 (in) 1 x Per Day/30 Days Discharge Instructions: Moisten w/normal saline or sterile water; Cover wound as directed. Do not remove from wound bed. Secondary Dressing: Non-Adherent Pad 3x8 (in/in) 1 x Per Day/30 Days Secured With: Tegaderm Film 4x4 (in/in) 1 x Per Day/30 Days Discharge Instructions: Apply to wound bed Electronic Signature(s) Signed: 09/09/2020 2:31:37 PM By: Lenda Kelp PA-C Signed: 09/09/2020 4:13:11 PM By: Phillis Haggis, Dondra Prader RN Entered By: Phillis Haggis, Dondra Prader on 09/09/2020 11:20:29 KHADIR, ROAM ([id number]) -------------------------------------------------------------------------------- Problem List Details Patient Name: DURK, CARMEN. Date of Service: 09/09/2020 10:30 AM Medical Record Number: [id number] Patient Account Number: [id number] Date of Birth/Sex: 1929-06-18 (87 y.o. M) Treating RN: Rogers Blocker Primary Care Provider: Mickey Farber Other Clinician: Lolita Cram Referring Provider: Mickey Farber Treating Provider/Extender: Rowan Blase in  Treatment: 2 Active Problems ICD-10 Encounter Code Description Active Date MDM Diagnosis L02.31 Cutaneous abscess of buttock 08/26/2020 No Yes L98.412 Non-pressure chronic ulcer of buttock with fat layer exposed 08/26/2020 No Yes E11.622 Type 2 diabetes mellitus with other skin ulcer 08/26/2020 No Yes I25.10 Atherosclerotic heart disease of native coronary artery without angina 08/26/2020 No Yes pectoris I50.42 Chronic combined systolic (congestive) and diastolic (congestive) heart 08/26/2020 No Yes failure Inactive Problems Resolved Problems Electronic Signature(s) Signed: 09/09/2020 10:32:19 AM By: Lenda Kelp PA-C Entered By: Lenda Kelp on 09/09/2020 10:32:19 Krage, [name] ([id number]) -------------------------------------------------------------------------------- Progress Note Details Patient Name: Norberta Keens. Date of Service: 09/09/2020 10:30 AM Medical Record Number: [id number] Patient Account Number: [id number] Date of Birth/Sex: 09/07/1928 (87 y.o. M) Treating RN: Rogers Blocker Primary Care Provider: Mickey Farber Other Clinician: Lolita Cram Referring Provider: Mickey Farber Treating Provider/Extender: Rowan Blase in Treatment: 2 Subjective Chief Complaint Information obtained from Patient Bilateral gluteal abscesses History of Present Illness (HPI) 08/26/2020 upon evaluation today patient presents for initial inspection here in our clinic concerning issues he is been having with his gluteal region. He has several abscesses 2 on the right 1 on the left that have been present since at least December. With that being said triamcinolone and mupirocin has been used currently along with Desitin unfortunately this just does not seem to be making the progress that they would like to see. Obviously I think that the patient is not really showing any signs of infection right now which is good he was on doxycycline as prescribed by urgent care until he was  seen in the ER in hospital where they apparently stopped this. Patient does have diabetes. He does also have a history of coronary artery disease. Subsequently his troponin was elevated in the hospital that is part of the reason why they kept him for analysis while he was there. Otherwise the patient seems to be doing well and I think he has a good chance of getting this to heal with appropriate treatment. 09/09/2020 upon evaluation today patient actually appears to be making good progress here with regard to his wounds. Fortunately there is no signs of active infection at this time. No fevers, chills, nausea, vomiting, or diarrhea. He has been tolerating the dressing changes without complication and his daughter-in-law is doing an excellent job taking care of this. Objective Constitutional Well-nourished and well-hydrated in no acute distress. Vitals Time Taken: 10:37 AM, Height: 70 in, Weight: 170 lbs, BMI: 24.4, Temperature: 97.7 F, Pulse: 86 bpm, Respiratory Rate: 18  breaths/min, Blood Pressure: 86/54 mmHg. Respiratory normal breathing without difficulty. Psychiatric this patient is able to make decisions and demonstrates good insight into disease process. Alert and Oriented x 3. pleasant and cooperative. General Notes: Patient's wound bed showed signs of good granulation epithelization there was a little bit of slough noted on the surface of the wounds I did perform sharp debridement to clear this away today he tolerated that without complication and post debridement wound beds appear to be doing much better which is great news. Integumentary (Hair, Skin) Wound #1 status is Open. Original cause of wound was Gradually Appeared. The date acquired was: 08/03/2020. The wound has been in treatment 2 weeks. The wound is located on the Right,Proximal Gluteus. The wound measures 0.5cm length x 0.5cm width x 1cm depth; 0.196cm^2 area and 0.196cm^3 volume. There is no tunneling or undermining noted.  There is a medium amount of serosanguineous drainage noted. There is small (1-33%) pink granulation within the wound bed. There is a large (67-100%) amount of necrotic tissue within the wound bed including Adherent Slough. Wound #2 status is Healed - Epithelialized. Original cause of wound was Gradually Appeared. The date acquired was: 08/03/2020. The wound has been in treatment 2 weeks. The wound is located on the Left,Distal Gluteus. The wound measures 0cm length x 0cm width x 0cm depth; 0cm^2 area and 0cm^3 volume. There is no tunneling or undermining noted. There is a none present amount of drainage noted. There is no granulation within the wound bed. There is no necrotic tissue within the wound bed. Wound #3 status is Open. Original cause of wound was Gradually Appeared. The date acquired was: 08/03/2020. The wound has been in treatment 2 weeks. The wound is located on the Right Gluteus. The wound measures 0.4cm length x 0.7cm width x 0.1cm depth; 0.22cm^2 area and 0.022cm^3 volume. There is Fat Layer (Subcutaneous Tissue) exposed. There is no tunneling or undermining noted. There is a medium amount of serosanguineous drainage noted. There is no granulation within the wound bed. There is a large (67-100%) amount of necrotic tissue within the wound bed including Adherent Slough. CATHERINE, OAK ([id number]) Assessment Active Problems ICD-10 Cutaneous abscess of buttock Non-pressure chronic ulcer of buttock with fat layer exposed Type 2 diabetes mellitus with other skin ulcer Atherosclerotic heart disease of native coronary artery without angina pectoris Chronic combined systolic (congestive) and diastolic (congestive) heart failure Procedures Wound #1 Pre-procedure diagnosis of Wound #1 is an Abscess located on the Right,Proximal Gluteus . There was a Excisional Skin/Subcutaneous Tissue Debridement with a total area of 0.25 sq cm performed by Nelida Meuse., PA-C. With the following  instrument(s): Curette to remove Viable and Non-Viable tissue/material. Material removed includes Subcutaneous Tissue and Slough and. A time out was conducted at 11:15, prior to the start of the procedure. A Minimum amount of bleeding was controlled with Pressure. The procedure was tolerated well. Post Debridement Measurements: 0.5cm length x 0.5cm width x 1.1cm depth; 0.216cm^3 volume. Character of Wound/Ulcer Post Debridement is stable. Post procedure Diagnosis Wound #1: Same as Pre-Procedure Plan Follow-up Appointments: Wound #1 Right,Proximal Gluteus: Return Appointment in 2 weeks. Wound #3 Right Gluteus: Return Appointment in 2 weeks. Non-Wound Condition: Additional non-wound orders/instructions: - Zinc as protective barrier Off-Loading: Wound #1 Right,Proximal Gluteus: Turn and reposition every 2 hours Wound #3 Right Gluteus: Turn and reposition every 2 hours WOUND #1: - Gluteus Wound Laterality: Right, Proximal Cleanser: Normal Saline 1 x Per Day/30 Days Discharge Instructions: Wash your hands with soap  and water. Remove old dressing, discard into plastic bag and place into trash. Cleanse the wound with Normal Saline prior to applying a clean dressing using gauze sponges, not tissues or cotton balls. Do not scrub or use excessive force. Pat dry using gauze sponges, not tissue or cotton balls. Peri-Wound Care: Skin Prep 1 x Per Day/30 Days Discharge Instructions: Use skin prep as directed Primary Dressing: Iodoform 1/4x 5 (in/yd) 1 x Per Day/30 Days Discharge Instructions: Apply Iodoform Packing Strip as instructed. Primary Dressing: Prisma 4.34 (in) 1 x Per Day/30 Days Discharge Instructions: Moisten w/normal saline or sterile water; Cover wound as directed. Do not remove from wound bed. Secondary Dressing: Non-Adherent Pad 3x8 (in/in) 1 x Per Day/30 Days Secured With: Tegaderm Film 4x4 (in/in) 1 x Per Day/30 Days Discharge Instructions: Apply to wound bed WOUND #3: -  Gluteus Wound Laterality: Right Cleanser: Normal Saline 1 x Per Day/30 Days Discharge Instructions: Wash your hands with soap and water. Remove old dressing, discard into plastic bag and place into trash. Cleanse the wound with Normal Saline prior to applying a clean dressing using gauze sponges, not tissues or cotton balls. Do not scrub or use excessive force. Pat dry using gauze sponges, not tissue or cotton balls. Primary Dressing: Iodoform 1/4x 5 (in/yd) 1 x Per Day/30 Days Discharge Instructions: Apply Iodoform Packing Strip as instructed. Primary Dressing: Prisma 4.34 (in) 1 x Per Day/30 Days Discharge Instructions: Moisten w/normal saline or sterile water; Cover wound as directed. Do not remove from wound bed. Secondary Dressing: Non-Adherent Pad 3x8 (in/in) 1 x Per Day/30 Days Secured With: Tegaderm Film 4x4 (in/in) 1 x Per Day/30 Days Discharge Instructions: Apply to wound bed CHAUNCEY, SCIULLI. ([id number]) 1. I would recommend that we going continue with the collagen dressings at this point as he seems to be doing extremely well with this. I am going to suggest for the deeper wound we still use a small piece of packing strip to hold that in place download. 2. I am also can recommend that this be covered with Tegaderm as before since she states that seems to be doing well for the patient I think that is fine to continue with currently. 3. I am also can recommend that we use zinc for the right gluteal region to help protect this area. Is it appears to be completely healed but nonetheless I think does need to still have some protection in this regard. We will see patient back for reevaluation in 2 weeks here in the clinic. If anything worsens or changes patient will contact our office for additional recommendations. Electronic Signature(s) Signed: 09/09/2020 11:22:30 AM By: Lenda Kelp PA-C Entered By: Lenda Kelp on 09/09/2020 11:22:30 Mcgrail, [name]  ([id number]) -------------------------------------------------------------------------------- SuperBill Details Patient Name: MARDY, LUCIER. Date of Service: 09/09/2020 Medical Record Number: [id number] Patient Account Number: [id number] Date of Birth/Sex: October 17, 1928 (87 y.o. M) Treating RN: Rogers Blocker Primary Care Provider: Mickey Farber Other Clinician: Lolita Cram Referring Provider: Mickey Farber Treating Provider/Extender: Rowan Blase in Treatment: 2 Diagnosis Coding ICD-10 Codes Code Description L02.31 Cutaneous abscess of buttock L98.412 Non-pressure chronic ulcer of buttock with fat layer exposed E11.622 Type 2 diabetes mellitus with other skin ulcer I25.10 Atherosclerotic heart disease of native coronary artery without angina pectoris I50.42 Chronic combined systolic (congestive) and diastolic (congestive) heart failure Facility Procedures CPT4 Code: [id number] Description: 11042 - DEB SUBQ TISSUE 20 SQ CM/< Modifier: Quantity: 1 CPT4 Code: Description: ICD-10 Diagnosis Description L02.31 Cutaneous abscess of buttock  Modifier: Quantity: Physician Procedures CPT4 Code: [id number] Description: 11042 - WC PHYS SUBQ TISS 20 SQ CM Modifier: Quantity: 1 CPT4 Code: Description: ICD-10 Diagnosis Description L02.31 Cutaneous abscess of buttock Modifier: Quantity: Electronic Signature(s) Signed: 09/09/2020 11:22:39 AM By: Lenda KelpStone III, Hoyt PA-C Entered By: Lenda KelpStone III, Hoyt on 09/09/2020 11:22:39

## 2020-09-23 ENCOUNTER — Other Ambulatory Visit: Payer: Self-pay

## 2020-09-23 ENCOUNTER — Encounter: Payer: Medicare Other | Admitting: Physician Assistant

## 2020-09-23 DIAGNOSIS — L0231 Cutaneous abscess of buttock: Secondary | ICD-10-CM | POA: Diagnosis not present

## 2020-09-23 NOTE — Progress Notes (Addendum)
KEATS, KINGRY ([id number]) Visit Report for 09/23/2020 Chief Complaint Document Details Patient Name: [name], [name]. Date of Service: 09/23/2020 1:45 PM Medical Record Number: [id number] Patient Account Number: [id number] Date of Birth/Sex: March 09, 1929 (87 y.o. M) Treating RN: Rogers Blocker Primary Care Provider: Mickey Farber Other Clinician: Lolita Cram Referring Provider: Mickey Farber Treating Provider/Extender: Rowan Blase in Treatment: 4 Information Obtained from: Patient Chief Complaint Bilateral gluteal abscesses Electronic Signature(s) Signed: 09/23/2020 2:24:44 PM By: Lenda Kelp PA-C Entered By: Lenda Kelp on 09/23/2020 14:24:44 Sassi, [name] ([id number]) -------------------------------------------------------------------------------- HPI Details Patient Name: [name], [name]. Date of Service: 09/23/2020 1:45 PM Medical Record Number: [id number] Patient Account Number: [id number] Date of Birth/Sex: 04-21-1929 (87 y.o. M) Treating RN: Rogers Blocker Primary Care Provider: Mickey Farber Other Clinician: Lolita Cram Referring Provider: Mickey Farber Treating Provider/Extender: Rowan Blase in Treatment: 4 History of Present Illness HPI Description: 08/26/2020 upon evaluation today patient presents for initial inspection here in our clinic concerning issues he is been having with his gluteal region. He has several abscesses 2 on the right 1 on the left that have been present since at least December. With that being said triamcinolone and mupirocin has been used currently along with Desitin unfortunately this just does not seem to be making the progress that they would like to see. Obviously I think that the patient is not really showing any signs of infection right now which is good he was on doxycycline as prescribed by urgent care until he was seen in the ER in hospital where they apparently stopped this. Patient does have diabetes. He does  also have a history of coronary artery disease. Subsequently his troponin was elevated in the hospital that is part of the reason why they kept him for analysis while he was there. Otherwise the patient seems to be doing well and I think he has a good chance of getting this to heal with appropriate treatment. 09/09/2020 upon evaluation today patient actually appears to be making good progress here with regard to his wounds. Fortunately there is no signs of active infection at this time. No fevers, chills, nausea, vomiting, or diarrhea. He has been tolerating the dressing changes without complication and his daughter-in-law is doing an excellent job taking care of this. 09/23/2020 upon evaluation today patient is making good progress in regard to the wounds in the gluteal region. He has been tolerating the dressing changes without complication and very pleased that he seems to be doing so well. There does not appear to be any evidence of active infection which is great news and overall I think that the patient is making wonderful progress. Electronic Signature(s) Signed: 09/23/2020 5:39:03 PM By: Lenda Kelp PA-C Entered By: Lenda Kelp on 09/23/2020 17:39:02 Gentzler, [name] ([id number]) -------------------------------------------------------------------------------- Physical Exam Details Patient Name: [name], [name]. Date of Service: 09/23/2020 1:45 PM Medical Record Number: [id number] Patient Account Number: [id number] Date of Birth/Sex: [birth year] (87 y.o. M) Treating RN: Rogers Blocker Primary Care Provider: Mickey Farber Other Clinician: Lolita Cram Referring Provider: Mickey Farber Treating Provider/Extender: Rowan Blase in Treatment: 4 Constitutional Well-nourished and well-hydrated in no acute distress. Respiratory normal breathing without difficulty. Psychiatric this patient is able to make decisions and demonstrates good insight into disease process. Alert and  Oriented x 3. pleasant and cooperative. Notes Patient's wound bed showed signs of good granulation epithelization. There does not appear to be anything that needs to require sharp debridement at this point and though sharp debridement  was performed I did perform mechanical debridement with saline and gauze and a sterile Q-tip he tolerated that without any significant pain. Electronic Signature(s) Signed: 09/23/2020 5:39:19 PM By: Lenda Kelp PA-C Entered By: Lenda Kelp on 09/23/2020 17:39:19 [name], [name] ([id number]) -------------------------------------------------------------------------------- Physician Orders Details Patient Name: [name], [name]. Date of Service: 09/23/2020 1:45 PM Medical Record Number: [id number] Patient Account Number: [id number] Date of Birth/Sex: 1929/02/18 (87 y.o. M) Treating RN: Rogers Blocker Primary Care Provider: Mickey Farber Other Clinician: Lolita Cram Referring Provider: Mickey Farber Treating Provider/Extender: Rowan Blase in Treatment: 4 Verbal / Phone Orders: No Diagnosis Coding ICD-10 Coding Code Description L02.31 Cutaneous abscess of buttock L98.412 Non-pressure chronic ulcer of buttock with fat layer exposed E11.622 Type 2 diabetes mellitus with other skin ulcer I25.10 Atherosclerotic heart disease of native coronary artery without angina pectoris I50.42 Chronic combined systolic (congestive) and diastolic (congestive) heart failure Follow-up Appointments o Return Appointment in 2 weeks. Off-Loading o Turn and reposition every 2 hours Wound Treatment Wound #1 - Gluteus Wound Laterality: Right, Proximal Cleanser: Normal Saline 1 x Per Day/30 Days Discharge Instructions: Wash your hands with soap and water. Remove old dressing, discard into plastic bag and place into trash. Cleanse the wound with Normal Saline prior to applying a clean dressing using gauze sponges, not tissues or cotton balls. Do not scrub or  use excessive force. Pat dry using gauze sponges, not tissue or cotton balls. Peri-Wound Care: Skin Prep 1 x Per Day/30 Days Discharge Instructions: Use skin prep as directed Primary Dressing: Prisma 4.34 (in) 1 x Per Day/30 Days Discharge Instructions: Moisten w/normal saline or sterile water; Cover wound as directed. Do not remove from wound bed. Secondary Dressing: Gauze 1 x Per Day/30 Days Discharge Instructions: As directed: dry, moistened with saline or moistened with Dakins Solution Secured With: Tegaderm Film 4x4 (in/in) 1 x Per Day/30 Days Discharge Instructions: Apply to wound bed Wound #3 - Gluteus Wound Laterality: Right Cleanser: Normal Saline 1 x Per Day/30 Days Discharge Instructions: Wash your hands with soap and water. Remove old dressing, discard into plastic bag and place into trash. Cleanse the wound with Normal Saline prior to applying a clean dressing using gauze sponges, not tissues or cotton balls. Do not scrub or use excessive force. Pat dry using gauze sponges, not tissue or cotton balls. Peri-Wound Care: Skin Prep 1 x Per Day/30 Days Discharge Instructions: Use skin prep as directed Primary Dressing: Prisma 4.34 (in) 1 x Per Day/30 Days Discharge Instructions: Moisten w/normal saline or sterile water; Cover wound as directed. Do not remove from wound bed. Secondary Dressing: Gauze 1 x Per Day/30 Days Discharge Instructions: As directed: dry, moistened with saline or moistened with Dakins Solution Secured With: Tegaderm Film 4x4 (in/in) 1 x Per Day/30 Days Discharge Instructions: Apply to wound bed [name], [name] ([id number]) Electronic Signature(s) Signed: 09/23/2020 4:40:53 PM By: Lajean Manes RN Signed: 09/23/2020 6:00:52 PM By: Lenda Kelp PA-C Entered By: Phillis Haggis, Dondra Prader on 09/23/2020 14:33:10 Fatzinger, [name] ([id number]) -------------------------------------------------------------------------------- Problem List Details Patient  Name: CONSTANCE, HACKENBERG. Date of Service: 09/23/2020 1:45 PM Medical Record Number: [id number] Patient Account Number: [id number] Date of Birth/Sex: 1928-12-16 (87 y.o. M) Treating RN: Rogers Blocker Primary Care Provider: Mickey Farber Other Clinician: Lolita Cram Referring Provider: Mickey Farber Treating Provider/Extender: Rowan Blase in Treatment: 4 Active Problems ICD-10 Encounter Code Description Active Date MDM Diagnosis L02.31 Cutaneous abscess of buttock 08/26/2020 No Yes L98.412 Non-pressure chronic ulcer of buttock  with fat layer exposed 08/26/2020 No Yes E11.622 Type 2 diabetes mellitus with other skin ulcer 08/26/2020 No Yes I25.10 Atherosclerotic heart disease of native coronary artery without angina 08/26/2020 No Yes pectoris I50.42 Chronic combined systolic (congestive) and diastolic (congestive) heart 08/26/2020 No Yes failure Inactive Problems Resolved Problems Electronic Signature(s) Signed: 09/23/2020 2:24:39 PM By: Lenda Kelp PA-C Entered By: Lenda Kelp on 09/23/2020 14:24:39 [name], [name] ([id number]) -------------------------------------------------------------------------------- Progress Note Details Patient Name: [name]. Date of Service: 09/23/2020 1:45 PM Medical Record Number: [id number] Patient Account Number: [id number] Date of Birth/Sex: [birth year] (87 y.o. M) Treating RN: Rogers Blocker Primary Care Provider: Mickey Farber Other Clinician: Lolita Cram Referring Provider: Mickey Farber Treating Provider/Extender: Rowan Blase in Treatment: 4 Subjective Chief Complaint Information obtained from Patient Bilateral gluteal abscesses History of Present Illness (HPI) 08/26/2020 upon evaluation today patient presents for initial inspection here in our clinic concerning issues he is been having with his gluteal region. He has several abscesses 2 on the right 1 on the left that have been present since at least December.  With that being said triamcinolone and mupirocin has been used currently along with Desitin unfortunately this just does not seem to be making the progress that they would like to see. Obviously I think that the patient is not really showing any signs of infection right now which is good he was on doxycycline as prescribed by urgent care until he was seen in the ER in hospital where they apparently stopped this. Patient does have diabetes. He does also have a history of coronary artery disease. Subsequently his troponin was elevated in the hospital that is part of the reason why they kept him for analysis while he was there. Otherwise the patient seems to be doing well and I think he has a good chance of getting this to heal with appropriate treatment. 09/09/2020 upon evaluation today patient actually appears to be making good progress here with regard to his wounds. Fortunately there is no signs of active infection at this time. No fevers, chills, nausea, vomiting, or diarrhea. He has been tolerating the dressing changes without complication and his daughter-in-law is doing an excellent job taking care of this. 09/23/2020 upon evaluation today patient is making good progress in regard to the wounds in the gluteal region. He has been tolerating the dressing changes without complication and very pleased that he seems to be doing so well. There does not appear to be any evidence of active infection which is great news and overall I think that the patient is making wonderful progress. Objective Constitutional Well-nourished and well-hydrated in no acute distress. Vitals Time Taken: 1:59 PM, Height: 70 in, Weight: 170 lbs, BMI: 24.4, Temperature: 98 F, Pulse: 60 bpm, Respiratory Rate: 18 breaths/min, Blood Pressure: 99/52 mmHg. Respiratory normal breathing without difficulty. Psychiatric this patient is able to make decisions and demonstrates good insight into disease process. Alert and Oriented x 3.  pleasant and cooperative. General Notes: Patient's wound bed showed signs of good granulation epithelization. There does not appear to be anything that needs to require sharp debridement at this point and though sharp debridement was performed I did perform mechanical debridement with saline and gauze and a sterile Q-tip he tolerated that without any significant pain. Integumentary (Hair, Skin) Wound #1 status is Open. Original cause of wound was Gradually Appeared. The date acquired was: 08/03/2020. The wound has been in treatment 4 weeks. The wound is located on the Right,Proximal Gluteus. The wound measures  0.4cm length x 0.5cm width x 0.5cm depth; 0.157cm^2 area and 0.079cm^3 volume. There is no tunneling or undermining noted. There is a medium amount of serosanguineous drainage noted. There is small (1-33%) pink granulation within the wound bed. There is a large (67-100%) amount of necrotic tissue within the wound bed including Adherent Slough. Wound #3 status is Open. Original cause of wound was Gradually Appeared. The date acquired was: 08/03/2020. The wound has been in treatment 4 weeks. The wound is located on the Right Gluteus. The wound measures 0.4cm length x 0.4cm width x 0.2cm depth; 0.126cm^2 area and 0.025cm^3 volume. There is Fat Layer (Subcutaneous Tissue) exposed. There is no tunneling or undermining noted. There is a medium amount of serosanguineous drainage noted. There is no granulation within the wound bed. There is a large (67-100%) amount of necrotic tissue within the wound bed including Adherent Slough. 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 Off-Loading: Turn and reposition every 2 hours WOUND #1: - Gluteus Wound Laterality: Right, Proximal Cleanser: Normal Saline 1 x Per Day/30 Days Discharge Instructions: Wash your hands with soap and water. Remove old dressing, discard into plastic bag and place into trash. Cleanse the wound with Normal Saline prior to applying a clean dressing using gauze sponges, not tissues or cotton balls. Do not scrub or use excessive force. Pat dry using gauze sponges, not tissue or cotton balls. Peri-Wound Care: Skin Prep 1 x Per Day/30 Days Discharge Instructions: Use skin prep as directed Primary Dressing: Prisma 4.34 (in) 1 x Per Day/30 Days Discharge Instructions: Moisten w/normal saline or sterile water; Cover wound as directed. Do not remove from wound bed. Secondary Dressing: Gauze 1 x Per Day/30 Days Discharge Instructions: As directed: dry, moistened with saline or moistened with Dakins Solution Secured With: Tegaderm Film 4x4 (in/in) 1 x Per Day/30 Days Discharge Instructions: Apply to wound bed WOUND #3: - Gluteus Wound Laterality: Right Cleanser: Normal Saline 1 x Per Day/30 Days Discharge Instructions: Wash your hands with soap and water. Remove old dressing, discard into plastic bag and place into trash. Cleanse the wound with Normal Saline prior to applying a clean dressing using gauze sponges, not tissues or cotton balls. Do not scrub or use excessive force. Pat dry using gauze sponges, not tissue or cotton balls. Peri-Wound Care: Skin Prep 1 x Per Day/30 Days Discharge Instructions: Use skin prep as directed Primary Dressing: Prisma 4.34 (in) 1 x Per Day/30 Days Discharge Instructions: Moisten w/normal saline or sterile water; Cover wound as directed. Do not remove from wound bed. Secondary Dressing: Gauze 1 x Per Day/30 Days Discharge Instructions: As directed: dry, moistened with saline or moistened with Dakins  Solution Secured With: Tegaderm Film 4x4 (in/in) 1 x Per Day/30 Days Discharge Instructions: Apply to wound bed 1. Would recommend that we continue with the silver collagen dressing I think that still the best way to go and patient is in agreement with the plan as is his family member who is present today. 2. I am also can recommend that we have them use the waterproof next care adhesive tape which I think may be easier on his skin than the Tegaderm that they have been utilizing currently. He has a  little bit of tape irritation from the Tegaderm. We will see patient back for reevaluation in 2 weeks here in the clinic. If anything worsens or changes patient will contact our office for additional recommendations. Electronic Signature(s) Signed: 09/23/2020 5:39:57 PM By: Lenda KelpStone III, Havah Ammon PA-C Previous Signature: 09/23/2020 5:39:46 PM Version By: Lenda KelpStone III, Saphyra Hutt PA-C Entered By: Lenda KelpStone III, Peytan Andringa on 09/23/2020 17:39:56 [name], Jeremy CatholicALBERT J. ([id number]) -------------------------------------------------------------------------------- SuperBill Details Patient Name: [name], Jeremy J. Date of Service: 09/23/2020 Medical Record Number: [id number] Patient Account Number: [id number] Date of Birth/Sex: 12/11/1928 (87 y.o. M) Treating RN: Rogers BlockerSanchez, Kenia Primary Care Provider: Mickey Farberhies, David Other Clinician: Lolita CramBurnette, Kyara Referring Provider: Mickey Farberhies, David Treating Provider/Extender: Rowan BlaseStone, Canyon Lohr Weeks in Treatment: 4 Diagnosis Coding ICD-10 Codes Code Description L02.31 Cutaneous abscess of buttock L98.412 Non-pressure chronic ulcer of buttock with fat layer exposed E11.622 Type 2 diabetes mellitus with other skin ulcer I25.10 Atherosclerotic heart disease of native coronary artery without angina pectoris I50.42 Chronic combined systolic (congestive) and diastolic (congestive) heart failure Facility Procedures CPT4 Code: [id number] Description: 99213 - WOUND CARE VISIT-LEV 3 EST PT Modifier: Quantity:  1 Physician Procedures CPT4 Code: [id number] Description: 99213 - WC PHYS LEVEL 3 - EST PT Modifier: Quantity: 1 CPT4 Code: Description: ICD-10 Diagnosis Description L02.31 Cutaneous abscess of buttock L98.412 Non-pressure chronic ulcer of buttock with fat layer exposed E11.622 Type 2 diabetes mellitus with other skin ulcer I25.10 Atherosclerotic heart disease of native  coronary artery without angina Modifier: pectoris Quantity: Electronic Signature(s) Signed: 09/23/2020 5:40:17 PM By: Lenda KelpStone III, Mikalia Fessel PA-C Previous Signature: 09/23/2020 4:40:53 PM Version By: Phillis HaggisSanchez Pereyda, Dondra PraderKenia RN Entered By: Lenda KelpStone III, Torria Fromer on 09/23/2020 17:40:17

## 2020-09-27 NOTE — Progress Notes (Signed)
KEYLEN, ECKENRODE ([id number]) Visit Report for 09/23/2020 Arrival Information Details Patient Name: [name], [name]. Date of Service: 09/23/2020 1:45 PM Medical Record Number: [id number] Patient Account Number: [id number] Date of Birth/Sex: 1929-06-07 (87 y.o. M) Treating RN: Carlene Coria Primary Care Adhrit Krenz: Ezequiel Kayser Other Clinician: Jeanine Luz Referring Yvett Rossel: Ezequiel Kayser Treating Teryl Mcconaghy/Extender: Skipper Cliche in Treatment: 4 Visit Information History Since Last Visit All ordered tests and consults were completed: No Patient Arrived: Gilford Rile Added or deleted any medications: No Arrival Time: 13:55 Any new allergies or adverse reactions: No Accompanied By: son Had a fall or experienced change in No Transfer Assistance: None activities of daily living that may affect Patient Identification Verified: Yes risk of falls: Secondary Verification Process Completed: Yes Signs or symptoms of abuse/neglect since last visito No Patient Requires Transmission-Based Precautions: No Hospitalized since last visit: No Patient Has Alerts: No Implantable device outside of the clinic excluding No cellular tissue based products placed in the center since last visit: Has Dressing in Place as Prescribed: Yes Pain Present Now: No Electronic Signature(s) Signed: 09/27/2020 5:08:01 PM By: Carlene Coria RN Entered By: Carlene Coria on 09/23/2020 13:59:03 Jimmerson, Shelda Altes ([id number]) -------------------------------------------------------------------------------- Clinic Level of Care Assessment Details Patient Name: [name]. Date of Service: 09/23/2020 1:45 PM Medical Record Number: [id number] Patient Account Number: [id number] Date of Birth/Sex: 1928/08/01 (87 y.o. M) Treating RN: Dolan Amen Primary Care Raider Valbuena: Ezequiel Kayser Other Clinician: Jeanine Luz Referring Landin Tallon: Ezequiel Kayser Treating Edger Husain/Extender: Skipper Cliche in Treatment: 4 Clinic  Level of Care Assessment Items TOOL 4 Quantity Score []  - Use when only an EandM is performed on FOLLOW-UP visit 0 ASSESSMENTS - Nursing Assessment / Reassessment X - Reassessment of Co-morbidities (includes updates in patient status) 1 10 X- 1 5 Reassessment of Adherence to Treatment Plan ASSESSMENTS - Wound and Skin Assessment / Reassessment []  - Simple Wound Assessment / Reassessment - one wound 0 X- 2 5 Complex Wound Assessment / Reassessment - multiple wounds []  - 0 Dermatologic / Skin Assessment (not related to wound area) ASSESSMENTS - Focused Assessment []  - Circumferential Edema Measurements - multi extremities 0 []  - 0 Nutritional Assessment / Counseling / Intervention []  - 0 Lower Extremity Assessment (monofilament, tuning fork, pulses) []  - 0 Peripheral Arterial Disease Assessment (using hand held doppler) ASSESSMENTS - Ostomy and/or Continence Assessment and Care []  - Incontinence Assessment and Management 0 []  - 0 Ostomy Care Assessment and Management (repouching, etc.) PROCESS - Coordination of Care X - Simple Patient / Family Education for ongoing care 1 15 []  - 0 Complex (extensive) Patient / Family Education for ongoing care []  - 0 Staff obtains Programmer, systems, Records, Test Results / Process Orders []  - 0 Staff telephones HHA, Nursing Homes / Clarify orders / etc []  - 0 Routine Transfer to another Facility (non-emergent condition) []  - 0 Routine Hospital Admission (non-emergent condition) []  - 0 New Admissions / Biomedical engineer / Ordering NPWT, Apligraf, etc. []  - 0 Emergency Hospital Admission (emergent condition) X- 1 10 Simple Discharge Coordination []  - 0 Complex (extensive) Discharge Coordination PROCESS - Special Needs []  - Pediatric / Minor Patient Management 0 []  - 0 Isolation Patient Management []  - 0 Hearing / Language / Visual special needs []  - 0 Assessment of Community assistance (transportation, D/C planning, etc.) []  -  0 Additional assistance / Altered mentation []  - 0 Support Surface(s) Assessment (bed, cushion, seat, etc.) INTERVENTIONS - Wound Cleansing / Measurement [place], Reynaldo J. ([id number]) []  - 0 Simple Wound  Cleansing - one wound X- 2 5 Complex Wound Cleansing - multiple wounds X- 1 5 Wound Imaging (photographs - any number of wounds) []  - 0 Wound Tracing (instead of photographs) []  - 0 Simple Wound Measurement - one wound X- 2 5 Complex Wound Measurement - multiple wounds INTERVENTIONS - Wound Dressings []  - Small Wound Dressing one or multiple wounds 0 X- 1 15 Medium Wound Dressing one or multiple wounds []  - 0 Large Wound Dressing one or multiple wounds []  - 0 Application of Medications - topical []  - 0 Application of Medications - injection INTERVENTIONS - Miscellaneous []  - External ear exam 0 []  - 0 Specimen Collection (cultures, biopsies, blood, body fluids, etc.) []  - 0 Specimen(s) / Culture(s) sent or taken to Lab for analysis []  - 0 Patient Transfer (multiple staff / Civil Service fast streamer / Similar devices) []  - 0 Simple Staple / Suture removal (25 or less) []  - 0 Complex Staple / Suture removal (26 or more) []  - 0 Hypo / Hyperglycemic Management (close monitor of Blood Glucose) []  - 0 Ankle / Brachial Index (ABI) - do not check if billed separately X- 1 5 Vital Signs Has the patient been seen at the hospital within the last three years: Yes Total Score: 95 Level Of Care: New/Established - Level 3 Electronic Signature(s) Signed: 09/23/2020 4:40:53 PM By: [name], [name] RN Entered By: [name], [name] on 09/23/2020 14:33:50 Cuttino, Shelda Altes ([id number]) -------------------------------------------------------------------------------- Encounter Discharge Information Details Patient Name: [name], [name]. Date of Service: 09/23/2020 1:45 PM Medical Record Number: [id number] Patient Account Number: [id number] Date of Birth/Sex: Mar 07, 1929 (87 y.o.  M) Treating RN: Dolan Amen Primary Care Lux Skilton: Ezequiel Kayser Other Clinician: Jeanine Luz Referring Jamicah Anstead: Ezequiel Kayser Treating Aisia Correira/Extender: Skipper Cliche in Treatment: 4 Encounter Discharge Information Items Discharge Condition: Stable Ambulatory Status: Walker Discharge Destination: Home Transportation: Private Auto Accompanied By: son Schedule Follow-up Appointment: Yes Clinical Summary of Care: Electronic Signature(s) Signed: 09/23/2020 4:40:53 PM By: [name], [name] RN Entered By: [name], [name] on 09/23/2020 14:34:57 Schmoll, Shelda Altes ([id number]) -------------------------------------------------------------------------------- Lower Extremity Assessment Details Patient Name: GREEN, QUINCY. Date of Service: 09/23/2020 1:45 PM Medical Record Number: [id number] Patient Account Number: [id number] Date of Birth/Sex: 1928-10-18 (87 y.o. M) Treating RN: Carlene Coria Primary Care Quintavis Brands: Ezequiel Kayser Other Clinician: Jeanine Luz Referring Brienne Liguori: Ezequiel Kayser Treating Freda Jaquith/Extender: Skipper Cliche in Treatment: 4 Electronic Signature(s) Signed: 09/27/2020 5:08:01 PM By: Carlene Coria RN Entered By: Carlene Coria on 09/23/2020 14:06:58 Kosinski, Shelda Altes ([id number]) -------------------------------------------------------------------------------- Multi Wound Chart Details Patient Name: CALIX, HEINBAUGH. Date of Service: 09/23/2020 1:45 PM Medical Record Number: [id number] Patient Account Number: [id number] Date of Birth/Sex: 08-19-1928 (87 y.o. M) Treating RN: Dolan Amen Primary Care Oslo Huntsman: Ezequiel Kayser Other Clinician: Jeanine Luz Referring Louvinia Cumbo: Ezequiel Kayser Treating Dorathea Faerber/Extender: Skipper Cliche in Treatment: 4 Vital Signs Height(in): 70 Pulse(bpm): 60 Weight(lbs): 170 Blood Pressure(mmHg): 99/52 Body Mass Index(BMI): 24 Temperature(F): 98 Respiratory Rate(breaths/min): 18 Photos:  [N/A:N/A] Wound Location: Right, Proximal Gluteus Right Gluteus N/A Wounding Event: Gradually Appeared Gradually Appeared N/A Primary Etiology: Abscess Abscess N/A Date Acquired: 08/03/2020 08/03/2020 N/A Weeks of Treatment: 4 4 N/A Wound Status: Open Open N/A Measurements L x W x D (cm) 0.4x0.5x0.5 0.4x0.4x0.2 N/A Area (cm) : 0.157 0.126 N/A Volume (cm) : 0.079 0.025 N/A % Reduction in Area: 42.90% -168.10% N/A % Reduction in Volume: 3.70% -400.00% N/A Classification: Full Thickness Without Exposed Full Thickness Without Exposed N/A Support Structures Support Structures Exudate Amount: Medium Medium  N/A Exudate Type: Serosanguineous Serosanguineous N/A Exudate Color: red, brown red, brown N/A Granulation Amount: Small (1-33%) None Present (0%) N/A Granulation Quality: Pink N/A N/A Necrotic Amount: Large (67-100%) Large (67-100%) N/A Exposed Structures: Fascia: No Fat Layer (Subcutaneous Tissue): N/A Fat Layer (Subcutaneous Tissue): Yes No Fascia: No Tendon: No Tendon: No Muscle: No Muscle: No Joint: No Joint: No Bone: No Bone: No Epithelialization: None None N/A Treatment Notes Electronic Signature(s) Signed: 09/23/2020 4:40:53 PM By: [name], [name] RN Entered By: [name], [name] on 09/23/2020 14:27:29 [name] ([id number]) -------------------------------------------------------------------------------- Multi-Disciplinary Care Plan Details Patient Name: KHANG, HANNUM. Date of Service: 09/23/2020 1:45 PM Medical Record Number: [id number] Patient Account Number: [id number] Date of Birth/Sex: 1928/08/27 (87 y.o. M) Treating RN: Dolan Amen Primary Care Tamiah Dysart: Ezequiel Kayser Other Clinician: Jeanine Luz Referring Jaiyon Wander: Ezequiel Kayser Treating Prestina Raigoza/Extender: Skipper Cliche in Treatment: 4 Active Inactive Wound/Skin Impairment Nursing Diagnoses: Impaired tissue integrity Goals: Patient/caregiver will verbalize understanding of  skin care regimen Date Initiated: 08/26/2020 Date Inactivated: 09/09/2020 Target Resolution Date: 08/26/2020 Goal Status: Met Ulcer/skin breakdown will have a volume reduction of 30% by week 4 Date Initiated: 08/26/2020 Date Inactivated: 09/23/2020 Target Resolution Date: 09/23/2020 Goal Status: Met Ulcer/skin breakdown will have a volume reduction of 50% by week 8 Date Initiated: 08/26/2020 Target Resolution Date: 10/24/2020 Goal Status: Active Ulcer/skin breakdown will have a volume reduction of 80% by week 12 Date Initiated: 08/26/2020 Target Resolution Date: 11/23/2020 Goal Status: Active Interventions: Assess patient/caregiver ability to obtain necessary supplies Assess patient/caregiver ability to perform ulcer/skin care regimen upon admission and as needed Assess ulceration(s) every visit Provide education on ulcer and skin care Treatment Activities: Skin care regimen initiated : 08/26/2020 Topical wound management initiated : 08/26/2020 Notes: Electronic Signature(s) Signed: 09/23/2020 4:40:53 PM By: [name], [name] RN Entered By: [name], [name] on 09/23/2020 14:26:38 [name] ([id number]) -------------------------------------------------------------------------------- Pain Assessment Details Patient Name: ABRIAN, HANOVER. Date of Service: 09/23/2020 1:45 PM Medical Record Number: [id number] Patient Account Number: [id number] Date of Birth/Sex: May 08, 1929 (87 y.o. M) Treating RN: Carlene Coria Primary Care Vincenza Dail: Ezequiel Kayser Other Clinician: Jeanine Luz Referring Dian Minahan: Ezequiel Kayser Treating Chelsee Hosie/Extender: Skipper Cliche in Treatment: 4 Active Problems Location of Pain Severity and Description of Pain Patient Has Paino No Site Locations Pain Management and Medication Current Pain Management: Electronic Signature(s) Signed: 09/27/2020 5:08:01 PM By: Carlene Coria RN Entered By: Carlene Coria on 09/23/2020 14:00:52 Rodessa, Shelda Altes  ([id number]) -------------------------------------------------------------------------------- Patient/Caregiver Education Details Patient Name: JOESPH, MARCY. Date of Service: 09/23/2020 1:45 PM Medical Record Number: [id number] Patient Account Number: [id number] Date of Birth/Gender: Jan 23, 1929 (87 y.o. M) Treating RN: Dolan Amen Primary Care Physician: Ezequiel Kayser Other Clinician: Jeanine Luz Referring Physician: Ezequiel Kayser Treating Physician/Extender: Skipper Cliche in Treatment: 4 Education Assessment Education Provided To: Caregiver son Education Topics Provided Notes waterproof bandages education Electronic Signature(s) Signed: 09/23/2020 4:40:53 PM By: [name], [name] RN Entered By: [name], [name] on 09/23/2020 14:34:13 [place], Shelda Altes ([id number]) -------------------------------------------------------------------------------- Wound Assessment Details Patient Name: CURTIES, CONIGLIARO. Date of Service: 09/23/2020 1:45 PM Medical Record Number: [id number] Patient Account Number: [id number] Date of Birth/Sex: 14-Feb-1929 (87 y.o. M) Treating RN: Carlene Coria Primary Care Kishawn Pickar: Ezequiel Kayser Other Clinician: Jeanine Luz Referring Mystique Bjelland: Ezequiel Kayser Treating Isha Seefeld/Extender: Skipper Cliche in Treatment: 4 Wound Status Wound Number: 1 Primary Etiology: Abscess Wound Location: Right, Proximal Gluteus Wound Status: Open Wounding Event: Gradually Appeared Date Acquired: 08/03/2020 Weeks Of Treatment: 4 Clustered Wound: No Photos  Wound Measurements Length: (cm) 0.4 Width: (cm) 0.5 Depth: (cm) 0.5 Area: (cm) 0.157 Volume: (cm) 0.079 % Reduction in Area: 42.9% % Reduction in Volume: 3.7% Epithelialization: None Tunneling: No Undermining: No Wound Description Classification: Full Thickness Without Exposed Support Structures Exudate Amount: Medium Exudate Type: Serosanguineous Exudate Color: red, brown Foul Odor After  Cleansing: No Slough/Fibrino Yes Wound Bed Granulation Amount: Small (1-33%) Exposed Structure Granulation Quality: Pink Fascia Exposed: No Necrotic Amount: Large (67-100%) Fat Layer (Subcutaneous Tissue) Exposed: No Necrotic Quality: Adherent Slough Tendon Exposed: No Muscle Exposed: No Joint Exposed: No Bone Exposed: No Treatment Notes Wound #1 (Gluteus) Wound Laterality: Right, Proximal Cleanser Normal Saline Discharge Instruction: Wash your hands with soap and water. Remove old dressing, discard into plastic bag and place into trash. Cleanse the wound with Normal Saline prior to applying a clean dressing using gauze sponges, not tissues or cotton balls. Do not scrub or use excessive force. Pat dry using gauze sponges, not tissue or cotton balls. ELIN, SEATS ([id number]) Peri-Wound Care Skin Prep Discharge Instruction: Use skin prep as directed Topical Primary Dressing Prisma 4.34 (in) Discharge Instruction: Moisten w/normal saline or sterile water; Cover wound as directed. Do not remove from wound bed. Secondary Dressing Gauze Discharge Instruction: As directed: dry, moistened with saline or moistened with Dakins Solution Secured With Tegaderm Film 4x4 (in/in) Discharge Instruction: Apply to wound bed Compression Wrap Compression Stockings Add-Ons Electronic Signature(s) Signed: 09/27/2020 5:08:01 PM By: Carlene Coria RN Entered By: Carlene Coria on 09/23/2020 14:05:56 [place], Shelda Altes ([id number]) -------------------------------------------------------------------------------- Wound Assessment Details Patient Name: BURLE, KWAN. Date of Service: 09/23/2020 1:45 PM Medical Record Number: [id number] Patient Account Number: [id number] Date of Birth/Sex: [birth year] (87 y.o. M) Treating RN: Carlene Coria Primary Care Tacarra Justo: Ezequiel Kayser Other Clinician: Jeanine Luz Referring Sima Lindenberger: Ezequiel Kayser Treating Jaley Yan/Extender: Skipper Cliche in  Treatment: 4 Wound Status Wound Number: 3 Primary Etiology: Abscess Wound Location: Right Gluteus Wound Status: Open Wounding Event: Gradually Appeared Date Acquired: 08/03/2020 Weeks Of Treatment: 4 Clustered Wound: No Photos Wound Measurements Length: (cm) 0.4 Width: (cm) 0.4 Depth: (cm) 0.2 Area: (cm) 0.126 Volume: (cm) 0.025 % Reduction in Area: -168.1% % Reduction in Volume: -400% Epithelialization: None Tunneling: No Undermining: No Wound Description Classification: Full Thickness Without Exposed Support Structures Exudate Amount: Medium Exudate Type: Serosanguineous Exudate Color: red, brown Foul Odor After Cleansing: No Slough/Fibrino Yes Wound Bed Granulation Amount: None Present (0%) Exposed Structure Necrotic Amount: Large (67-100%) Fascia Exposed: No Necrotic Quality: Adherent Slough Fat Layer (Subcutaneous Tissue) Exposed: Yes Tendon Exposed: No Muscle Exposed: No Joint Exposed: No Bone Exposed: No Treatment Notes Wound #3 (Gluteus) Wound Laterality: Right Cleanser Normal Saline Discharge Instruction: Wash your hands with soap and water. Remove old dressing, discard into plastic bag and place into trash. Cleanse the wound with Normal Saline prior to applying a clean dressing using gauze sponges, not tissues or cotton balls. Do not scrub or use excessive force. Pat dry using gauze sponges, not tissue or cotton balls. DEIVI, HUCKINS ([id number]) Peri-Wound Care Skin Prep Discharge Instruction: Use skin prep as directed Topical Primary Dressing Prisma 4.34 (in) Discharge Instruction: Moisten w/normal saline or sterile water; Cover wound as directed. Do not remove from wound bed. Secondary Dressing Gauze Discharge Instruction: As directed: dry, moistened with saline or moistened with Dakins Solution Secured With Tegaderm Film 4x4 (in/in) Discharge Instruction: Apply to wound bed Compression Wrap Compression Stockings Add-Ons Electronic  Signature(s) Signed: 09/27/2020 5:08:01 PM By: Carlene Coria RN Entered By: Carlene Coria  on 09/23/2020 14:06:43 LASTER, APPLING ([id number]) -------------------------------------------------------------------------------- Vitals Details Patient Name: LYAL, HUSTED. Date of Service: 09/23/2020 1:45 PM Medical Record Number: [id number] Patient Account Number: [id number] Date of Birth/Sex: [birth year] (87 y.o. M) Treating RN: Carlene Coria Primary Care Timoth Schara: Ezequiel Kayser Other Clinician: Jeanine Luz Referring Zenora Karpel: Ezequiel Kayser Treating Yarel Rushlow/Extender: Skipper Cliche in Treatment: 4 Vital Signs Time Taken: 13:59 Temperature (F): 98 Height (in): 70 Pulse (bpm): 60 Weight (lbs): 170 Respiratory Rate (breaths/min): 18 Body Mass Index (BMI): 24.4 Blood Pressure (mmHg): 99/52 Reference Range: 80 - 120 mg / dl Electronic Signature(s) Signed: 09/27/2020 5:08:01 PM By: Carlene Coria RN Entered By: Carlene Coria on 09/23/2020 14:00:45

## 2020-09-29 ENCOUNTER — Ambulatory Visit
Admission: EM | Admit: 2020-09-29 | Discharge: 2020-09-29 | Disposition: A | Discharge status: Home / Self Care | Payer: Medicare Other | Attending: Sports Medicine | Admitting: Sports Medicine

## 2020-09-29 ENCOUNTER — Other Ambulatory Visit: Payer: Self-pay

## 2020-09-29 DIAGNOSIS — L0231 Cutaneous abscess of buttock: Secondary | ICD-10-CM | POA: Diagnosis not present

## 2020-09-29 MED ORDER — DOXYCYCLINE HYCLATE 100 MG PO CAPS: 100.0000 mg | ORAL_CAPSULE | Freq: Two times a day (BID) | ORAL | 0 refills | Status: DC

## 2020-09-29 NOTE — ED Triage Notes (Signed)
Patient states that he has had an abscess since 1 week ago. States that it is on the top of his buttocks.

## 2020-09-29 NOTE — Discharge Instructions (Signed)
You have a new abscess on the left side of your buttock area.  As we discussed there would really be no benefit to incise and drain that today and is not ready.  I will place you on antibiotics.  Please get that from the pharmacy.  You should do warm compresses or heating pad or hot baths to try to bring some of that pus to the surface and allow to drain.  Please do not squeeze or pick at it.  Please keep the area clean and dry.  Please keep an eye on it and inform wound care of any changes.  Certainly if there is any significant fevers or redness around the wound please go to the ER or come back here for reevaluation.  I hope you get to feeling better, Dr. Zachery Dauer

## 2020-09-30 NOTE — ED Provider Notes (Signed)
[facility]    CSN: [id number] Arrival date & time: 09/29/20  1653      History   Chief Complaint Chief Complaint  Patient presents with  . Abscess    HPI [name] is a 87 y.o. male.   Patient is a pleasant 87 year old male who presents with his son for evaluation of the above issue.  He has had multiple abscesses in the buttock region and is followed by wound care.  He is experiencing here at the urgent care in the past as well.  He has a new abscess on the left side of his buttock near the midline right over the sacrum.  No significant fever shakes chills.  Given that it is after hours his son wanted him evaluated to make sure there was nothing further that needed to be done.  No red flag signs or symptoms elicited on history.     Past Medical History:  Diagnosis Date  . CAD (coronary artery disease)   . Cardiac defibrillator in place   . CHF (congestive heart failure) (HCC)   . Diabetes mellitus without complication (HCC)   . History of myocardial infarction   . Peripheral neuropathy   . Restless leg syndrome     Patient Active Problem List   Diagnosis Date Noted  . Elevated troponin 08/23/2020  . Diabetes mellitus without complication (HCC) 08/23/2020  . BPH (benign prostatic hyperplasia) 08/23/2020  . Postural dizziness with presyncope 08/23/2020  . AICD (automatic cardioverter/defibrillator) present 08/23/2020  . Carotid stenosis 08/23/2020  . Sacral decubitus ulcer 08/23/2020  . Near syncope 08/23/2020  . Chronic systolic CHF (congestive heart failure), NYHA class 3 (HCC) 07/20/2020  . Coronary artery disease involving native coronary artery of native heart 07/20/2020  . Hyperlipidemia, mixed 07/20/2020  . Atrial fibrillation (HCC) 07/06/2020    Past Surgical History:  Procedure Laterality Date  . APPENDECTOMY    . CARDIAC DEFIBRILLATOR PLACEMENT    . PARTIAL KNEE ARTHROPLASTY Left        Home Medications    Prior to  Admission medications   Medication Sig Start Date End Date Taking? Authorizing Provider  acetaminophen (TYLENOL) 500 MG tablet Take 1 tablet (500 mg total) by mouth every 6 (six) hours as needed. 08/24/20  Yes Lurene Shadow, MD  aspirin 81 MG EC tablet Take by mouth.   Yes [provider]  brimonidine (ALPHAGAN P) 0.1 % SOLN Place 1 drop into the left eye 2 (two) times daily.   Yes [provider]  Cholecalciferol 50 MCG (2000 UT) CAPS Take 2,000 Units by mouth daily.   Yes [provider]  Coenzyme Q10 100 MG capsule Take 100 mg by mouth daily.   Yes [provider]  dorzolamide (TRUSOPT) 2 % ophthalmic solution Place 1 drop into both eyes 2 (two) times daily.   Yes [provider]  doxycycline (VIBRAMYCIN) 100 MG capsule Take 1 capsule (100 mg total) by mouth 2 (two) times daily. 09/29/20  Yes Delton See, MD  ENTRESTO 24-26 MG SMARTSIG:1 Tablet(s) By Mouth Every 12 Hours 09/09/20  Yes [provider]  finasteride (PROSCAR) 5 MG tablet Take by mouth. 06/23/20 06/23/21 Yes [provider]  furosemide (LASIX) 40 MG tablet Take 40 mg by mouth daily. 07/20/20  Yes [provider]  gabapentin (NEURONTIN) 300 MG capsule Take 300 mg by mouth 2 (two) times daily. 07/06/20  Yes [provider]  latanoprost (XALATAN) 0.005 % ophthalmic solution Place 1 drop into both  eyes at bedtime.   Yes [provider]  metFORMIN (GLUCOPHAGE) 500 MG tablet Take 500 mg by mouth daily with breakfast.   Yes [provider]  metoprolol succinate (TOPROL-XL) 25 MG 24 hr tablet Take 25 mg by mouth daily. 08/11/20  Yes [provider]  Multiple Vitamin (MULTI-VITAMIN) tablet Take 1 tablet by mouth daily.   Yes [provider]  tamsulosin (FLOMAX) 0.4 MG CAPS capsule Take by mouth.   Yes [provider]  triamcinolone ointment (KENALOG) 0.5 % Apply 1 application topically 2 (two) times daily. 07/06/20  Yes  [provider]  nitrofurantoin, macrocrystal-monohydrate, (MACROBID) 100 MG capsule Take 100 mg by mouth 2 (two) times daily. 09/16/20   [provider]    Family History History reviewed. No pertinent family history.  Social History Social History   Tobacco Use  . Smoking status: Never Smoker  . Smokeless tobacco: Never Used  Vaping Use  . Vaping Use: Never used  Substance Use Topics  . Alcohol use: Not Currently  . Drug use: Not Currently     Allergies   Patient has no known allergies.   Review of Systems Review of Systems  Constitutional: Positive for activity change. Negative for appetite change, chills, diaphoresis, fatigue and fever.  HENT: Negative.   Eyes: Negative.   Respiratory: Negative.   Cardiovascular: Negative.  Negative for chest pain.  Gastrointestinal: Negative.  Negative for abdominal pain, anal bleeding, blood in stool, constipation, diarrhea, nausea and vomiting.  Genitourinary: Negative.  Negative for dysuria and hematuria.  Musculoskeletal: Negative.   Skin: Positive for color change and wound. Negative for pallor and rash.  Neurological: Negative.  Negative for dizziness, light-headedness and numbness.  Hematological: Does not bruise/bleed easily.  All other systems reviewed and are negative.    Physical Exam Triage Vital Signs ED Triage Vitals  Enc Vitals Group     BP 09/29/20 1738 92/66     Pulse Rate 09/29/20 1738 89     Resp 09/29/20 1738 18     Temp 09/29/20 1738 97.7 F (36.5 C)     Temp Source 09/29/20 1738 Oral     SpO2 09/29/20 1738 96 %     Weight 09/29/20 1735 172 lb 6.4 oz (78.2 kg)     Height 09/29/20 1735 6' (1.829 m)     Head Circumference --      Peak Flow --      Pain Score 09/29/20 1734 8     Pain Loc --      Pain Edu? --      Excl. in GC? --    No data found.  Updated Vital Signs BP 92/66 (BP Location: Right Arm)   Pulse 89   Temp 97.7 F (36.5 C) (Oral)   Resp 18   Ht 6' (1.829 m)   Wt  78.2 kg   SpO2 96%   BMI 23.38 kg/m   Visual Acuity Right Eye Distance:   Left Eye Distance:   Bilateral Distance:    Right Eye Near:   Left Eye Near:    Bilateral Near:     Physical Exam Vitals and nursing note reviewed.  Constitutional:      General: He is not in acute distress.    Appearance: Normal appearance. He is not ill-appearing, toxic-appearing or diaphoretic.  HENT:     Head: Normocephalic and atraumatic.     Nose: No congestion or rhinorrhea.     Mouth/Throat:     Mouth: Mucous  membranes are moist.  Cardiovascular:     Rate and Rhythm: Normal rate and regular rhythm.     Pulses: Normal pulses.     Heart sounds: Normal heart sounds. No murmur heard. No friction rub. No gallop.   Pulmonary:     Effort: Pulmonary effort is normal.     Breath sounds: Normal breath sounds.  Musculoskeletal:     Cervical back: Normal range of motion and neck supple.  Skin:    General: Skin is warm.     Capillary Refill: Capillary refill takes less than 2 seconds.     Findings: Abscess, erythema and lesion present. No ecchymosis, signs of injury, petechiae, rash or wound.     Comments: Abscesses hard and indurated.  Not able to express any pus.  Not able to be I&D today.  Neurological:     General: No focal deficit present.     Mental Status: He is alert and oriented to person, place, and time.      UC Treatments / Results  Labs (all labs ordered are listed, but only abnormal results are displayed) Labs Reviewed - No data to display  EKG   Radiology No results found.  Procedures Procedures (including critical care time)  Medications Ordered in UC Medications - No data to display  Initial Impression / Assessment and Plan / UC Course  I have reviewed the triage vital signs and the nursing notes.  Pertinent labs & imaging results that were available during my care of the patient were reviewed by me and considered in my medical decision making (see chart for  details).  Clinical impression: New abscess of the left buttock just over the sacrum in the midline.  Patient has other abscesses in and around that area.  Treatment plan: 1.  The findings and treatment plan were discussed in detail with the patient and his son.  All parties were in agreement voiced verbal understanding. 2.  I indicated it was not ready to be incised and drained.  I recommended supportive care.  Heat over that area to try to thin the pus and bring it to the surface a lot to express on its own.  If it gets to the point where it becomes more painful and can be I indeed I want him to come back here or see his primary care provider. 3.  Continue with wound care for the other abscesses.  Hopefully they can address this 1 as well. 4.  I know to go ahead and put him on doxycycline 100 mg twice a day. 5.  I asked him to keep that area clean and dry.  It was dressed before he left.  I do not want him to squeeze or pick at it. 6.  If any changes, his pain increases, or he develops any fevers and significant wound redness around that area he can come back here or go to the emergency room.    Final Clinical Impressions(s) / UC Diagnoses   Final diagnoses:  Abscess of buttock, left     Discharge Instructions     You have a new abscess on the left side of your buttock area.  As we discussed there would really be no benefit to incise and drain that today and is not ready.  I will place you on antibiotics.  Please get that from the pharmacy.  You should do warm compresses or heating pad or hot baths to try to bring some of that pus to the surface and  allow to drain.  Please do not squeeze or pick at it.  Please keep the area clean and dry.  Please keep an eye on it and inform wound care of any changes.  Certainly if there is any significant fevers or redness around the wound please go to the ER or come back here for reevaluation.  I hope you get to feeling better, Dr. Zachery Dauer   ED  Prescriptions    Medication Sig Dispense Auth. Provider   doxycycline (VIBRAMYCIN) 100 MG capsule Take 1 capsule (100 mg total) by mouth 2 (two) times daily. 20 capsule Delton See, MD     PDMP not reviewed this encounter.   Delton See, MD 10/04/20 1034

## 2020-10-07 ENCOUNTER — Encounter: Payer: Medicare Other | Attending: Physician Assistant | Admitting: Physician Assistant

## 2020-10-07 ENCOUNTER — Other Ambulatory Visit: Payer: Self-pay

## 2020-10-07 ENCOUNTER — Other Ambulatory Visit
Admission: RE | Admit: 2020-10-07 | Discharge: 2020-10-07 | Disposition: A | Discharge status: Home / Self Care | Payer: Medicare Other | Source: Ambulatory Visit | Attending: Physician Assistant | Admitting: Physician Assistant

## 2020-10-07 DIAGNOSIS — E11622 Type 2 diabetes mellitus with other skin ulcer: Secondary | ICD-10-CM | POA: Insufficient documentation

## 2020-10-07 DIAGNOSIS — I5042 Chronic combined systolic (congestive) and diastolic (congestive) heart failure: Secondary | ICD-10-CM | POA: Insufficient documentation

## 2020-10-07 DIAGNOSIS — L98412 Non-pressure chronic ulcer of buttock with fat layer exposed: Secondary | ICD-10-CM | POA: Diagnosis not present

## 2020-10-07 DIAGNOSIS — L089 Local infection of the skin and subcutaneous tissue, unspecified: Secondary | ICD-10-CM | POA: Diagnosis present

## 2020-10-07 DIAGNOSIS — L0231 Cutaneous abscess of buttock: Secondary | ICD-10-CM | POA: Diagnosis not present

## 2020-10-07 DIAGNOSIS — I2510 Atherosclerotic heart disease of native coronary artery without angina pectoris: Secondary | ICD-10-CM | POA: Diagnosis not present

## 2020-10-07 NOTE — Progress Notes (Addendum)
[name] ([id number]) Visit Report for 10/07/2020 Chief Complaint Document Details Patient Name: [name], [name]. Date of Service: 10/07/2020 10:45 AM Medical Record Number: [id number] Patient Account Number: [id number] Date of Birth/Sex: August 07, 1928 (87 y.o. M) Treating RN: Rogers Blocker Primary Care Provider: Mickey Farber Other Clinician: Lolita Cram Referring Provider: Mickey Farber Treating Provider/Extender: Rowan Blase in Treatment: 6 Information Obtained from: Patient Chief Complaint Bilateral gluteal abscesses Electronic Signature(s) Signed: 10/07/2020 11:11:55 AM By: Lenda Kelp PA-C Entered By: Lenda Kelp on 10/07/2020 11:11:55 [place], [name] ([id number]) -------------------------------------------------------------------------------- Debridement Details Patient Name: [name]. Date of Service: 10/07/2020 10:45 AM Medical Record Number: [id number] Patient Account Number: [id number] Date of Birth/Sex: July 23, 1928 (87 y.o. M) Treating RN: Rogers Blocker Primary Care Provider: Mickey Farber Other Clinician: Lolita Cram Referring Provider: Mickey Farber Treating Provider/Extender: Rowan Blase in Treatment: 6 Debridement Performed for Wound #4 Left Gluteus Assessment: Performed By: Physician Nelida Meuse., PA-C Debridement Type: Debridement Level of Consciousness (Pre- Awake and Alert procedure): Pre-procedure Verification/Time Out Yes - 11:15 Taken: Total Area Debrided (L x W): 0.7 (cm) x 0.5 (cm) = 0.35 (cm) Tissue and other material Viable, Non-Viable, Slough, Subcutaneous, Slough debrided: Level: Skin/Subcutaneous Tissue Debridement Description: Excisional Instrument: Curette Bleeding: Minimum Hemostasis Achieved: Pressure Response to Treatment: Procedure was tolerated well Level of Consciousness (Post- Awake and Alert procedure): Post Debridement Measurements of Total Wound Length: (cm) 0.7 Width: (cm)  0.5 Depth: (cm) 1.2 Volume: (cm) 0.33 Character of Wound/Ulcer Post Debridement: Stable Post Procedure Diagnosis Same as Pre-procedure Electronic Signature(s) Signed: 10/07/2020 5:35:33 PM By: Lajean Manes RN Signed: 10/07/2020 6:06:53 PM By: Lenda Kelp PA-C Entered By: Phillis Haggis, Dondra Prader on 10/07/2020 11:21:15 Norberta Keens ([id number]) -------------------------------------------------------------------------------- HPI Details Patient Name: [name]. Date of Service: 10/07/2020 10:45 AM Medical Record Number: [id number] Patient Account Number: [id number] Date of Birth/Sex: 18-Oct-1928 (87 y.o. M) Treating RN: Rogers Blocker Primary Care Provider: Mickey Farber Other Clinician: Lolita Cram Referring Provider: Mickey Farber Treating Provider/Extender: Rowan Blase in Treatment: 6 History of Present Illness HPI Description: 08/26/2020 upon evaluation today patient presents for initial inspection here in our clinic concerning issues he is been having with his gluteal region. He has several abscesses 2 on the right 1 on the left that have been present since at least December. With that being said triamcinolone and mupirocin has been used currently along with Desitin unfortunately this just does not seem to be making the progress that they would like to see. Obviously I think that the patient is not really showing any signs of infection right now which is good he was on doxycycline as prescribed by urgent care until he was seen in the ER in hospital where they apparently stopped this. Patient does have diabetes. He does also have a history of coronary artery disease. Subsequently his troponin was elevated in the hospital that is part of the reason why they kept him for analysis while he was there. Otherwise the patient seems to be doing well and I think he has a good chance of getting this to heal with appropriate treatment. 09/09/2020 upon evaluation today  patient actually appears to be making good progress here with regard to his wounds. Fortunately there is no signs of active infection at this time. No fevers, chills, nausea, vomiting, or diarrhea. He has been tolerating the dressing changes without complication and his daughter-in-law is doing an excellent job taking care of this. 09/23/2020 upon evaluation today patient is making  good progress in regard to the wounds in the gluteal region. He has been tolerating the dressing changes without complication and very pleased that he seems to be doing so well. There does not appear to be any evidence of active infection which is great news and overall I think that the patient is making wonderful progress. 10/07/20 upon evaluation today patient appears to be doing excellent in regard to his right gluteal region. He has a healed wound on the distal gluteal region and proximally this is measuring much better. That is great news. Unfortunately he did have a new area open up on the left gluteal region this does appear to be an abscess and does not have to be cleaned out. He has been on doxycycline as prescribed by urgent care. Electronic Signature(s) Signed: 10/07/2020 5:57:15 PM By: Lenda Kelp PA-C Entered By: Lenda Kelp on 10/07/2020 17:57:14 [place], [name] ([id number]) -------------------------------------------------------------------------------- Physical Exam Details Patient Name: [name]. Date of Service: 10/07/2020 10:45 AM Medical Record Number: [id number] Patient Account Number: [id number] Date of Birth/Sex: 02-Jul-1929 (87 y.o. M) Treating RN: Rogers Blocker Primary Care Provider: Mickey Farber Other Clinician: Lolita Cram Referring Provider: Mickey Farber Treating Provider/Extender: Rowan Blase in Treatment: 6 Constitutional Well-nourished and well-hydrated in no acute distress. Respiratory normal breathing without difficulty. Psychiatric this patient is able to  make decisions and demonstrates good insight into disease process. Alert and Oriented x 3. pleasant and cooperative. Notes Upon inspection patient's wound bed actually showed signs of good granulation epithelization at this point. There does not appear to be any evidence of infection which is great news and I do believe the doxycycline has been beneficial. With that being said no culture was obtained and after debridement today of the left gluteal location and cleaning out the abscess I did obtain a culture in order to ensure that he is on the appropriate antibiotics. I am also can recommend collagen here and packing with just plain packing strip and behind. Electronic Signature(s) Signed: 10/07/2020 5:57:42 PM By: Lenda Kelp PA-C Entered By: Lenda Kelp on 10/07/2020 17:57:42 Bartle, [name] ([id number]) -------------------------------------------------------------------------------- Physician Orders Details Patient Name: DANY, WALTHER. Date of Service: 10/07/2020 10:45 AM Medical Record Number: [id number] Patient Account Number: [id number] Date of Birth/Sex: 1928/11/22 (87 y.o. M) Treating RN: Rogers Blocker Primary Care Provider: Mickey Farber Other Clinician: Lolita Cram Referring Provider: Mickey Farber Treating Provider/Extender: Rowan Blase in Treatment: 6 Verbal / Phone Orders: No Diagnosis Coding ICD-10 Coding Code Description L02.31 Cutaneous abscess of buttock L98.412 Non-pressure chronic ulcer of buttock with fat layer exposed E11.622 Type 2 diabetes mellitus with other skin ulcer I25.10 Atherosclerotic heart disease of native coronary artery without angina pectoris I50.42 Chronic combined systolic (congestive) and diastolic (congestive) heart failure Follow-up Appointments o Return Appointment in 1 week. Off-Loading o Turn and reposition every 2 hours Medications-Please add to medication list. o Other: - Apply Mupirocin intranasal and under  finger nails 1 time a day for 1 week then weekly for next 4 weeks Wound Treatment Wound #1 - Gluteus Wound Laterality: Right, Proximal Cleanser: Hibiclens, 16 (oz) 1 x Per Day/30 Days Discharge Instructions: Put Hibiclenso on your skin head to toe and rub it in gently for five minutes with a washcloth. Turn the water back on and rinse very well with warm water. Do not use your regular soap after using and rinsing Hibiclenso. Pat yourself dry with a clean towel for a week. Peri-Wound Care: Skin Prep 1 x  Per Day/30 Days Discharge Instructions: Use skin prep as directed Primary Dressing: Prisma 4.34 (in) 1 x Per Day/30 Days Discharge Instructions: Moisten w/normal saline or sterile water; Cover wound as directed. Do not remove from wound bed. Secondary Dressing: Gauze 1 x Per Day/30 Days Discharge Instructions: As directed: dry, moistened with saline or moistened with Dakins Solution Secured With: Tegaderm Film 4x4 (in/in) 1 x Per Day/30 Days Discharge Instructions: Apply to wound bed Wound #4 - Gluteus Wound Laterality: Left Cleanser: Hibiclens, 16 (oz) 1 x Per Day/30 Days Discharge Instructions: Put Hibiclenso on your skin head to toe and rub it in gently for five minutes with a washcloth. Turn the water back on and rinse very well with warm water. Do not use your regular soap after using and rinsing Hibiclenso. Pat yourself dry with a clean towel for a week. Peri-Wound Care: Skin Prep 1 x Per Day/30 Days Discharge Instructions: Use skin prep as directed Primary Dressing: Prisma 4.34 (in) 1 x Per Day/30 Days Discharge Instructions: Moisten w/normal saline or sterile water; apply to wound bed Do not remove from wound bed. Primary Dressing: Packing Strip 1/2 (in) 1 x Per Day/30 Days Discharge Instructions: Apply Packing Strip 1/2 (in) as instructed Secondary Dressing: Gauze 1 x Per Day/30 Days Discharge Instructions: As directed: dry, moistened with saline or moistened with Dakins  Solution DRELYN, PISTILLI ([id number]) Secured With: Tegaderm Film 4x4 (in/in) 1 x Per Day/30 Days Discharge Instructions: Apply to wound bed Laboratory o Bacteria identified in Wound by Culture (MICRO) - non healing wound - (ICD10 L02.31 - Cutaneous abscess of buttock) oooo LOINC Code: 6462-6 oooo Convenience Name: Wound culture routine Patient Medications Allergies: No Known Allergies Notifications Medication Indication Start End mupirocin 10/07/2020 DOSE topical 2 % ointment - ointment topical applied in a thin film in the nostrils and under the nails 1 time per day for 1 week then 1 time per week for 4 weeks Electronic Signature(s) Signed: 10/13/2020 5:04:17 PM By: Lenda Kelp PA-C Signed: 10/18/2020 7:55:55 AM By: Yevonne Pax RN Previous Signature: 10/07/2020 11:31:25 AM Version By: Lenda Kelp PA-C Entered By: Yevonne Pax on 10/13/2020 09:50:53 [place]ler, [name] ([id number]) -------------------------------------------------------------------------------- Problem List Details Patient Name: KEREM, GILMER. Date of Service: 10/07/2020 10:45 AM Medical Record Number: [id number] Patient Account Number: [id number] Date of Birth/Sex: January 08, 1929 (87 y.o. M) Treating RN: Rogers Blocker Primary Care Provider: Mickey Farber Other Clinician: Lolita Cram Referring Provider: Mickey Farber Treating Provider/Extender: Rowan Blase in Treatment: 6 Active Problems ICD-10 Encounter Code Description Active Date MDM Diagnosis L02.31 Cutaneous abscess of buttock 08/26/2020 No Yes L98.412 Non-pressure chronic ulcer of buttock with fat layer exposed 08/26/2020 No Yes E11.622 Type 2 diabetes mellitus with other skin ulcer 08/26/2020 No Yes I25.10 Atherosclerotic heart disease of native coronary artery without angina 08/26/2020 No Yes pectoris I50.42 Chronic combined systolic (congestive) and diastolic (congestive) heart 08/26/2020 No Yes failure Inactive Problems Resolved  Problems Electronic Signature(s) Signed: 10/07/2020 11:11:49 AM By: Lenda Kelp PA-C Entered By: Lenda Kelp on 10/07/2020 11:11:48 [place], [name] ([id number]) -------------------------------------------------------------------------------- Progress Note Details Patient Name: Norberta Keens. Date of Service: 10/07/2020 10:45 AM Medical Record Number: [id number] Patient Account Number: [id number] Date of Birth/Sex: Jul 09, 1928 (87 y.o. M) Treating RN: Rogers Blocker Primary Care Provider: Mickey Farber Other Clinician: Lolita Cram Referring Provider: Mickey Farber Treating Provider/Extender: Rowan Blase in Treatment: 6 Subjective Chief Complaint Information obtained from Patient Bilateral gluteal abscesses History of Present Illness (HPI) 08/26/2020 upon evaluation today  patient presents for initial inspection here in our clinic concerning issues he is been having with his gluteal region. He has several abscesses 2 on the right 1 on the left that have been present since at least December. With that being said triamcinolone and mupirocin has been used currently along with Desitin unfortunately this just does not seem to be making the progress that they would like to see. Obviously I think that the patient is not really showing any signs of infection right now which is good he was on doxycycline as prescribed by urgent care until he was seen in the ER in hospital where they apparently stopped this. Patient does have diabetes. He does also have a history of coronary artery disease. Subsequently his troponin was elevated in the hospital that is part of the reason why they kept him for analysis while he was there. Otherwise the patient seems to be doing well and I think he has a good chance of getting this to heal with appropriate treatment. 09/09/2020 upon evaluation today patient actually appears to be making good progress here with regard to his wounds. Fortunately there is  no signs of active infection at this time. No fevers, chills, nausea, vomiting, or diarrhea. He has been tolerating the dressing changes without complication and his daughter-in-law is doing an excellent job taking care of this. 09/23/2020 upon evaluation today patient is making good progress in regard to the wounds in the gluteal region. He has been tolerating the dressing changes without complication and very pleased that he seems to be doing so well. There does not appear to be any evidence of active infection which is great news and overall I think that the patient is making wonderful progress. 10/07/20 upon evaluation today patient appears to be doing excellent in regard to his right gluteal region. He has a healed wound on the distal gluteal region and proximally this is measuring much better. That is great news. Unfortunately he did have a new area open up on the left gluteal region this does appear to be an abscess and does not have to be cleaned out. He has been on doxycycline as prescribed by urgent care. Objective Constitutional Well-nourished and well-hydrated in no acute distress. Vitals Time Taken: 10:53 AM, Height: 70 in, Weight: 170 lbs, BMI: 24.4, Temperature: 98.2 F, Pulse: 61 bpm, Respiratory Rate: 16 breaths/min, Blood Pressure: 104/53 mmHg. Respiratory normal breathing without difficulty. Psychiatric this patient is able to make decisions and demonstrates good insight into disease process. Alert and Oriented x 3. pleasant and cooperative. General Notes: Upon inspection patient's wound bed actually showed signs of good granulation epithelization at this point. There does not appear to be any evidence of infection which is great news and I do believe the doxycycline has been beneficial. With that being said no culture was obtained and after debridement today of the left gluteal location and cleaning out the abscess I did obtain a culture in order to ensure that he is on the  appropriate antibiotics. I am also can recommend collagen here and packing with just plain packing strip and behind. Integumentary (Hair, Skin) Wound #1 status is Open. Original cause of wound was Gradually Appeared. The date acquired was: 08/03/2020. The wound has been in treatment 6 weeks. The wound is located on the Right,Proximal Gluteus. The wound measures 0.3cm length x 0.4cm width x 0.4cm depth; 0.094cm^2 area and 0.038cm^3 volume. There is Fat Layer (Subcutaneous Tissue) exposed. There is no tunneling or undermining noted. There is  a small amount of serous drainage noted. There is small (1-33%) red, pink granulation within the wound bed. There is a large (67-100%) amount of necrotic tissue within the wound bed including Adherent Slough. Wound #3 status is Healed - Epithelialized. Original cause of wound was Gradually Appeared. The date acquired was: 08/03/2020. The wound has been in treatment 6 weeks. The wound is located on the Right,Distal Gluteus. The wound measures 0cm length x 0cm width x 0cm depth; 0cm^2 Durney, Hernandez J. ([id number]) area and 0cm^3 volume. There is no tunneling or undermining noted. There is a none present amount of drainage noted. There is no granulation within the wound bed. There is no necrotic tissue within the wound bed. Wound #4 status is Open. Original cause of wound was Gradually Appeared. The date acquired was: 09/23/2020. The wound is located on the Left Gluteus. The wound measures 0.7cm length x 0.5cm width x 0.1cm depth; 0.275cm^2 area and 0.027cm^3 volume. There is Fat Layer (Subcutaneous Tissue) exposed. There is no tunneling or undermining noted. There is a small amount of serous drainage noted. There is medium (34-66%) red granulation within the wound bed. There is a medium (34-66%) amount of necrotic tissue within the wound bed including Adherent Slough. Assessment Active Problems ICD-10 Cutaneous abscess of buttock Non-pressure chronic ulcer of  buttock with fat layer exposed Type 2 diabetes mellitus with other skin ulcer Atherosclerotic heart disease of native coronary artery without angina pectoris Chronic combined systolic (congestive) and diastolic (congestive) heart failure Procedures Wound #4 Pre-procedure diagnosis of Wound #4 is an Abscess located on the Left Gluteus . There was a Excisional Skin/Subcutaneous Tissue Debridement with a total area of 0.35 sq cm performed by Nelida Meuse., PA-C. With the following instrument(s): Curette to remove Viable and Non-Viable tissue/material. Material removed includes Subcutaneous Tissue and Slough and. A time out was conducted at 11:15, prior to the start of the procedure. A Minimum amount of bleeding was controlled with Pressure. The procedure was tolerated well. Post Debridement Measurements: 0.7cm length x 0.5cm width x 1.2cm depth; 0.33cm^3 volume. Character of Wound/Ulcer Post Debridement is stable. Post procedure Diagnosis Wound #4: Same as Pre-Procedure Plan Follow-up Appointments: Return Appointment in 1 week. Off-Loading: Turn and reposition every 2 hours Medications-Please add to medication list.: Other: - Apply Mupirocin intranasal and under finger nails 1 time a day for 1 week then weekly for next 4 weeks The following medication(s) was prescribed: mupirocin topical 2 % ointment ointment topical applied in a thin film in the nostrils and under the nails 1 time per day for 1 week then 1 time per week for 4 weeks starting 10/07/2020 WOUND #1: - Gluteus Wound Laterality: Right, Proximal Cleanser: Hibiclens, 16 (oz) 1 x Per Day/30 Days Discharge Instructions: Put Hibiclens on your skin head to toe and rub it in gently for five minutes with a washcloth. Turn the water back on and rinse very well with warm water. Do not use your regular soap after using and rinsing Hibiclens. Pat yourself dry with a clean towel for a week. Peri-Wound Care: Skin Prep 1 x Per Day/30  Days Discharge Instructions: Use skin prep as directed Primary Dressing: Prisma 4.34 (in) 1 x Per Day/30 Days Discharge Instructions: Moisten w/normal saline or sterile water; Cover wound as directed. Do not remove from wound bed. Secondary Dressing: Gauze 1 x Per Day/30 Days Discharge Instructions: As directed: dry, moistened with saline or moistened with Dakins Solution Secured With: Tegaderm Film 4x4 (in/in) 1 x Per Day/30  Days Discharge Instructions: Apply to wound bed WOUND #4: - Gluteus Wound Laterality: Left Cleanser: Hibiclens, 16 (oz) 1 x Per Day/30 Days Discharge Instructions: Put Hibiclens on your skin head to toe and rub it in gently for five minutes with a washcloth. Turn the water back on and rinse very well with warm water. Do not use your regular soap after using and rinsing Hibiclens. Pat yourself dry with a clean towel for a week. Peri-Wound Care: Skin Prep 1 x Per Day/30 Days Discharge Instructions: Use skin prep as directed Norberta KeensBRADFORD, Governor J. ([id number]) Primary Dressing: Prisma 4.34 (in) 1 x Per Day/30 Days Discharge Instructions: Moisten w/normal saline or sterile water; apply to wound bed Do not remove from wound bed. Primary Dressing: Packing Strip 1/2 (in) 1 x Per Day/30 Days Discharge Instructions: Apply Packing Strip 1/2 (in) as instructed Secondary Dressing: Gauze 1 x Per Day/30 Days Discharge Instructions: As directed: dry, moistened with saline or moistened with Dakins Solution Secured With: Tegaderm Film 4x4 (in/in) 1 x Per Day/30 Days Discharge Instructions: Apply to wound bed 1. We will continue with the silver collagen to all wound locations. 2. In the left gluteal location will be using plain packing strip and by in order to hold the collagen in place. 3. Muscle can recommend that we continue with the Tegaderm to cover after applying gauze which seems to be doing well for him. We will see patient back for reevaluation in 1 week here in the clinic.  If anything worsens or changes patient will contact our office for additional recommendations. Electronic Signature(s) Signed: 10/07/2020 5:58:17 PM By: Lenda KelpStone III, Kentley Cedillo PA-C Entered By: Lenda KelpStone III, Tirth Cothron on 10/07/2020 17:58:16 Clutter, Malena CatholicALBERT J. ([id number]) -------------------------------------------------------------------------------- SuperBill Details Patient Name: Norberta KeensBRADFORD, Silverio J. Date of Service: 10/07/2020 Medical Record Number: [id number] Patient Account Number: [id number] Date of Birth/Sex: 11/05/1928 (87 y.o. M) Treating RN: Rogers BlockerSanchez, Kenia Primary Care Provider: Mickey Farberhies, David Other Clinician: Lolita CramBurnette, Kyara Referring Provider: Mickey Farberhies, David Treating Provider/Extender: Rowan BlaseStone, Musette Kisamore Weeks in Treatment: 6 Diagnosis Coding ICD-10 Codes Code Description L02.31 Cutaneous abscess of buttock L98.412 Non-pressure chronic ulcer of buttock with fat layer exposed E11.622 Type 2 diabetes mellitus with other skin ulcer I25.10 Atherosclerotic heart disease of native coronary artery without angina pectoris I50.42 Chronic combined systolic (congestive) and diastolic (congestive) heart failure Facility Procedures CPT4 Code: [id number] Description: 11042 - DEB SUBQ TISSUE 20 SQ CM/< Modifier: Quantity: 1 CPT4 Code: Description: ICD-10 Diagnosis Description L02.31 Cutaneous abscess of buttock Modifier: Quantity: Physician Procedures CPT4 Code: [id number] Description: 99214 - WC PHYS LEVEL 4 - EST PT Modifier: 25 Quantity: 1 CPT4 Code: Description: ICD-10 Diagnosis Description L02.31 Cutaneous abscess of buttock L98.412 Non-pressure chronic ulcer of buttock with fat layer exposed E11.622 Type 2 diabetes mellitus with other skin ulcer I25.10 Atherosclerotic heart disease of native  coronary artery without angina Modifier: pectoris Quantity: CPT4 Code: [id number] Description: 11042 - WC PHYS SUBQ TISS 20 SQ CM Modifier: Quantity: 1 CPT4 Code: Description: ICD-10 Diagnosis Description L02.31  Cutaneous abscess of buttock Modifier: Quantity: Electronic Signature(s) Signed: 10/07/2020 5:59:33 PM By: Lenda KelpStone III, Kayvion Arneson PA-C Previous Signature: 10/07/2020 5:58:44 PM Version By: Lenda KelpStone III, Michaelle Bottomley PA-C Previous Signature: 10/07/2020 5:35:33 PM Version By: Phillis HaggisSanchez Pereyda, Dondra PraderKenia RN Entered By: Lenda KelpStone III, Saidy Ormand on 10/07/2020 17:59:32

## 2020-10-08 NOTE — Progress Notes (Signed)
[name], [name] ([id number]) Visit Report for 10/07/2020 Arrival Information Details Patient Name: [name], [name]. Date of Service: 10/07/2020 10:45 AM Medical Record Number: [id number] Patient Account Number: [id number] Date of Birth/Sex: 1929/06/17 (87 y.o. M) Treating RN: [name] Primary Care [name]: [name] Other Clinician: Jeanine Wallace Referring [name]: [name] Treating [name]/Extender: [name] in Treatment: 6 Visit Information History Since Last Visit Added or deleted any medications: No Patient Arrived: [name] Had a fall or experienced change in No Arrival Time: 10:52 activities of daily living that may affect Accompanied By: son risk of falls: Transfer Assistance: None Hospitalized since last visit: No Patient Identification Verified: Yes Pain Present Now: No Secondary Verification Process Completed: Yes Patient Requires Transmission-Based Precautions: No Patient Has Alerts: No Electronic Signature(s) Signed: 10/08/2020 8:09:31 AM By: [name] Entered By: [name] on 10/07/2020 10:55:20 [name], [name] ([id number]) -------------------------------------------------------------------------------- Clinic Level of Care Assessment Details Patient Name: [name]. Date of Service: 10/07/2020 10:45 AM Medical Record Number: [id number] Patient Account Number: [id number] Date of Birth/Sex: 1929-06-25 (87 y.o. M) Treating RN: [name] Primary Care [name]: [name] Other Clinician: Jeanine Wallace Referring [name]: [name] Treating [name]/Extender: [name] in Treatment: 6 Clinic Level of Care Assessment Items TOOL 1 Quantity Score []  - Use when EandM and Procedure is performed on INITIAL visit 0 ASSESSMENTS - Nursing Assessment / Reassessment []  - General Physical Exam (combine w/ comprehensive assessment (listed just below) when performed on new 0 pt. evals) []  - 0 Comprehensive  Assessment (HX, ROS, Risk Assessments, Wounds Hx, etc.) ASSESSMENTS - Wound and Skin Assessment / Reassessment []  - Dermatologic / Skin Assessment (not related to wound area) 0 ASSESSMENTS - Ostomy and/or Continence Assessment and Care []  - Incontinence Assessment and Management 0 []  - 0 Ostomy Care Assessment and Management (repouching, etc.) PROCESS - Coordination of Care []  - Simple Patient / Family Education for ongoing care 0 []  - 0 Complex (extensive) Patient / Family Education for ongoing care []  - 0 Staff obtains Programmer, systems, Records, Test Results / Process Orders []  - 0 Staff telephones HHA, Nursing Homes / Clarify orders / etc []  - 0 Routine Transfer to another Facility (non-emergent condition) []  - 0 Routine Hospital Admission (non-emergent condition) []  - 0 New Admissions / Biomedical engineer / Ordering NPWT, Apligraf, etc. []  - 0 Emergency Hospital Admission (emergent condition) PROCESS - Special Needs []  - Pediatric / Minor Patient Management 0 []  - 0 Isolation Patient Management []  - 0 Hearing / Language / Visual special needs []  - 0 Assessment of Community assistance (transportation, D/C planning, etc.) []  - 0 Additional assistance / Altered mentation []  - 0 Support Surface(s) Assessment (bed, cushion, seat, etc.) INTERVENTIONS - Miscellaneous []  - External ear exam 0 []  - 0 Patient Transfer (multiple staff / Civil Service fast streamer / Similar devices) []  - 0 Simple Staple / Suture removal (25 or less) []  - 0 Complex Staple / Suture removal (26 or more) []  - 0 Hypo/Hyperglycemic Management (do not check if billed separately) []  - 0 Ankle / Brachial Index (ABI) - do not check if billed separately Has the patient been seen at the hospital within the last three years: Yes Total Score: 0 Level Of Care: ____ [name] ([id number]) Electronic Signature(s) Signed: 10/07/2020 5:35:33 PM By: [name], Jeremy Breeding RN Entered By: [name], [name] on  10/07/2020 11:28:37 [name] ([id number]) -------------------------------------------------------------------------------- Encounter Discharge Information Details Patient Name: [name]. Date of Service: 10/07/2020 10:45 AM Medical  Record Number: [id number] Patient Account Number: [id number] Date of Birth/Sex: February 10, 1929 (87 y.o. M) Treating RN: [name] Primary Care Gerilynn Mccullars: [name] Other Clinician: Jeanine Wallace Referring Tianni Escamilla: [name] Treating [name]/Extender: [name] in Treatment: 6 Encounter Discharge Information Items Post Procedure Vitals Discharge Condition: Stable Temperature (F): 97.3 Ambulatory Status: Walker Pulse (bpm): 61 Discharge Destination: Home Respiratory Rate (breaths/min): 18 Transportation: Private Auto Blood Pressure (mmHg): 104/53 Accompanied By: son Schedule Follow-up Appointment: Yes Clinical Summary of Care: Electronic Signature(s) Signed: 10/07/2020 11:42:59 AM By: [name] Entered By: [name] on 10/07/2020 11:42:59 [name] ([id number]) -------------------------------------------------------------------------------- Lower Extremity Assessment Details Patient Name: [name]. Date of Service: 10/07/2020 10:45 AM Medical Record Number: [id number] Patient Account Number: [id number] Date of Birth/Sex: [birth year] (87 y.o. M) Treating RN: [name] Primary Care [name]: [name] Other Clinician: Jeanine Wallace Referring Okema Rollinson: [name] Treating Evella Kasal/Extender: [name] Weeks in Treatment: 6 Electronic Signature(s) Signed: 10/07/2020 5:35:33 PM By: Charlett Nose RN Signed: 10/08/2020 8:09:31 AM By: [name] Entered By: [name] on 10/07/2020 11:09:42 [name], [name] ([id number]) -------------------------------------------------------------------------------- Multi Wound Chart Details Patient Name: [name], [name]. Date of Service:  10/07/2020 10:45 AM Medical Record Number: [id number] Patient Account Number: [id number] Date of Birth/Sex: 28-Dec-1928 (87 y.o. M) Treating RN: [name] Primary Care Sharah Finnell: [name] Other Clinician: Jeanine Wallace Referring Cornelio Parkerson: [name] Treating Corgan Mormile/Extender: [name] in Treatment: 6 Vital Signs Height(in): 70 Pulse(bpm): 33 Weight(lbs): 170 Blood Pressure(mmHg): 104/53 Body Mass Index(BMI): 24 Temperature(F): 98.2 Respiratory Rate(breaths/min): 16 Photos: Wound Location: Right, Proximal Gluteus Right, Distal Gluteus Gluteus Wounding Event: Gradually Appeared Gradually Appeared Gradually Appeared Primary Etiology: Abscess Abscess Abscess Date Acquired: 08/03/2020 08/03/2020 09/23/2020 Weeks of Treatment: 6 6 0 Wound Status: Open Healed - Epithelialized Open Measurements L x W x D (cm) 0.3x0.4x0.4 0x0x0 0.7x0.5x0.1 Area (cm) : 0.094 0 0.275 Volume (cm) : 0.038 0 0.027 % Reduction in Area: 65.80% 100.00% N/A % Reduction in Volume: 53.70% 100.00% N/A Classification: Full Thickness Without Exposed Full Thickness Without Exposed Full Thickness Without Exposed Support Structures Support Structures Support Structures Exudate Amount: Small None Present Small Exudate Type: Serous N/A Serous Exudate Color: amber N/A amber Granulation Amount: Small (1-33%) None Present (0%) Medium (34-66%) Granulation Quality: Red, Pink N/A Red Necrotic Amount: Large (67-100%) None Present (0%) Medium (34-66%) Exposed Structures: Fat Layer (Subcutaneous Tissue): Fascia: No Fat Layer (Subcutaneous Tissue): Yes Fat Layer (Subcutaneous Tissue): Yes Fascia: No No Fascia: No Tendon: No Tendon: No Tendon: No Muscle: No Muscle: No Muscle: No Joint: No Joint: No Joint: No Bone: No Bone: No Bone: No Epithelialization: None Large (67-100%) N/A Treatment Notes Electronic Signature(s) Signed: 10/07/2020 5:35:33 PM By: [name], Jeremy Breeding RN Entered By: [name], [name] on 10/07/2020 11:19:26 [name], [name] ([id number]) -------------------------------------------------------------------------------- Multi-Disciplinary Care Plan Details Patient Name: [name], [name]. Date of Service: 10/07/2020 10:45 AM Medical Record Number: [id number] Patient Account Number: [id number] Date of Birth/Sex: 1928/08/02 (87 y.o. M) Treating RN: [name] Primary Care Sirinity Outland: [name] Other Clinician: Jeanine Wallace Referring Ivyrose Hashman: [name] Treating Myan Suit/Extender: [name] in Treatment: 6 Active Inactive Wound/Skin Impairment Nursing Diagnoses: Impaired tissue integrity Goals: Patient/caregiver will verbalize understanding of skin care regimen Date Initiated: 08/26/2020 Date Inactivated: 09/09/2020 Target Resolution Date: 08/26/2020 Goal Status: Met Ulcer/skin breakdown will have a volume reduction of 30% by week 4 Date Initiated: 08/26/2020 Date Inactivated: 09/23/2020 Target Resolution Date: 09/23/2020 Goal Status: Met Ulcer/skin breakdown will have a volume reduction of 50%  by week 8 Date Initiated: 08/26/2020 Target Resolution Date: 10/24/2020 Goal Status: Active Ulcer/skin breakdown will have a volume reduction of 80% by week 12 Date Initiated: 08/26/2020 Target Resolution Date: 11/23/2020 Goal Status: Active Interventions: Assess patient/caregiver ability to obtain necessary supplies Assess patient/caregiver ability to perform ulcer/skin care regimen upon admission and as needed Assess ulceration(s) every visit Provide education on ulcer and skin care Treatment Activities: Skin care regimen initiated : 08/26/2020 Topical wound management initiated : 08/26/2020 Notes: Electronic Signature(s) Signed: 10/07/2020 5:35:33 PM By: [name], Jeremy Breeding RN Entered By: [name], [name] on 10/07/2020 11:18:51 Armato, [name]  ([id number]) -------------------------------------------------------------------------------- Pain Assessment Details Patient Name: [name], [name]. Date of Service: 10/07/2020 10:45 AM Medical Record Number: [id number] Patient Account Number: [id number] Date of Birth/Sex: 06-26-1929 (87 y.o. M) Treating RN: [name] Primary Care Gordy Goar: [name] Other Clinician: Jeanine Wallace Referring Karia Ehresman: [name] Treating Garry Nicolini/Extender: [name] in Treatment: 6 Active Problems Location of Pain Severity and Description of Pain Patient Has Paino No Site Locations Rate the pain. Current Pain Level: 0 Pain Management and Medication Current Pain Management: Electronic Signature(s) Signed: 10/07/2020 5:35:33 PM By: [name], Jeremy Breeding RN Signed: 10/08/2020 8:09:31 AM By: [name] Entered By: [name] on 10/07/2020 10:56:52 [name], [name] ([id number]) -------------------------------------------------------------------------------- Patient/Caregiver Education Details Patient Name: [name], [name]. Date of Service: 10/07/2020 10:45 AM Medical Record Number: [id number] Patient Account Number: [id number] Date of Birth/Gender: 1929-05-24 (87 y.o. M) Treating RN: [name] Primary Care Physician: [name] Other Clinician: Jeanine Wallace Referring Physician: Ezequiel Wallace Treating Physician/Extender: [name] in Treatment: 6 Education Assessment Education Provided To: Patient Education Topics Provided Wound/Skin Impairment: Methods: Explain/Verbal Responses: State content correctly Notes Mupirocin education Electronic Signature(s) Signed: 10/07/2020 5:35:33 PM By: [name], Jeremy Breeding RN Entered By: [name], [name] on 10/07/2020 11:29:52 MACHAEL, RAINE ([id number]) -------------------------------------------------------------------------------- Wound Assessment Details Patient Name: TIMBER, LUCARELLI. Date of  Service: 10/07/2020 10:45 AM Medical Record Number: [id number] Patient Account Number: [id number] Date of Birth/Sex: 05/02/1929 (87 y.o. M) Treating RN: [name] Primary Care Navi Erber: [name] Other Clinician: Jeanine Wallace Referring Revella Shelton: [name] Treating Brodi Nery/Extender: [name] in Treatment: 6 Wound Status Wound Number: 1 Primary Etiology: Abscess Wound Location: Right, Proximal Gluteus Wound Status: Open Wounding Event: Gradually Appeared Date Acquired: 08/03/2020 Weeks Of Treatment: 6 Clustered Wound: No Photos Wound Measurements Length: (cm) 0.3 Width: (cm) 0.4 Depth: (cm) 0.4 Area: (cm) 0.094 Volume: (cm) 0.038 % Reduction in Area: 65.8% % Reduction in Volume: 53.7% Epithelialization: None Tunneling: No Undermining: No Wound Description Classification: Full Thickness Without Exposed Support Structures Exudate Amount: Small Exudate Type: Serous Exudate Color: amber Foul Odor After Cleansing: No Slough/Fibrino Yes Wound Bed Granulation Amount: Small (1-33%) Exposed Structure Granulation Quality: Red, Pink Fascia Exposed: No Necrotic Amount: Large (67-100%) Fat Layer (Subcutaneous Tissue) Exposed: Yes Necrotic Quality: Adherent Slough Tendon Exposed: No Muscle Exposed: No Joint Exposed: No Bone Exposed: No Treatment Notes Wound #1 (Gluteus) Wound Laterality: Right, Proximal Cleanser Hibiclens, 16 (oz) Discharge Instruction: Put Hibiclenso on your skin head to toe and rub it in gently for five minutes with a washcloth. Turn the water back on and rinse very well with warm water. Do not use your regular soap after using and rinsing Hibiclenso. Pat yourself dry with a clean towel for a week. CHESKY, HEYER ([id number]) Peri-Wound Care Skin Prep Discharge Instruction: Use skin prep as directed Topical Primary Dressing Prisma 4.34 (in) Discharge Instruction: Moisten w/normal saline or sterile water;  Cover wound as directed.  Do not remove from wound bed. Secondary Dressing Gauze Discharge Instruction: As directed: dry, moistened with saline or moistened with Dakins Solution Secured With Tegaderm Film 4x4 (in/in) Discharge Instruction: Apply to wound bed Compression Wrap Compression Stockings Add-Ons Electronic Signature(s) Signed: 10/07/2020 5:35:33 PM By: [name], Jeremy Breeding RN Signed: 10/08/2020 8:09:31 AM By: [name] Entered By: [name] on 10/07/2020 11:06:53 Rayle, [name] ([id number]) -------------------------------------------------------------------------------- Wound Assessment Details Patient Name: TISHAWN, FRIEDHOFF. Date of Service: 10/07/2020 10:45 AM Medical Record Number: [id number] Patient Account Number: [id number] Date of Birth/Sex: 12-21-1928 (87 y.o. M) Treating RN: [name] Primary Care Kerilyn Cortner: [name] Other Clinician: Jeanine Wallace Referring Shadara Lopez: [name] Treating Makayle Krahn/Extender: [name] in Treatment: 6 Wound Status Wound Number: 3 Primary Etiology: Abscess Wound Location: Right, Distal Gluteus Wound Status: Healed - Epithelialized Wounding Event: Gradually Appeared Date Acquired: 08/03/2020 Weeks Of Treatment: 6 Clustered Wound: No Photos Wound Measurements Length: (cm) 0 Width: (cm) 0 Depth: (cm) 0 Area: (cm) 0 Volume: (cm) 0 % Reduction in Area: 100% % Reduction in Volume: 100% Epithelialization: Large (67-100%) Tunneling: No Undermining: No Wound Description Classification: Full Thickness Without Exposed Support Structure Exudate Amount: None Present s Foul Odor After Cleansing: No Slough/Fibrino No Wound Bed Granulation Amount: None Present (0%) Exposed Structure Necrotic Amount: None Present (0%) Fascia Exposed: No Fat Layer (Subcutaneous Tissue) Exposed: No Tendon Exposed: No Muscle Exposed: No Joint Exposed: No Bone Exposed: No Electronic Signature(s) Signed: 10/07/2020 5:35:33 PM By: [name], Jeremy Breeding RN Entered By: [name], [name] on 10/07/2020 11:15:15 Teller, [name] ([id number]) -------------------------------------------------------------------------------- Wound Assessment Details Patient Name: ONTARIO, PETTENGILL. Date of Service: 10/07/2020 10:45 AM Medical Record Number: [id number] Patient Account Number: [id number] Date of Birth/Sex: Apr 20, 1929 (87 y.o. M) Treating RN: [name] Primary Care Lola Czerwonka: [name] Other Clinician: Jeanine Wallace Referring Gene Colee: [name] Treating Reighlynn Swiney/Extender: [name] in Treatment: 6 Wound Status Wound Number: 4 Primary Etiology: Abscess Wound Location: Gluteus Wound Status: Open Wounding Event: Gradually Appeared Date Acquired: 09/23/2020 Weeks Of Treatment: 0 Clustered Wound: No Photos Wound Measurements Length: (cm) 0.7 Width: (cm) 0.5 Depth: (cm) 0.1 Area: (cm) 0.275 Volume: (cm) 0.027 % Reduction in Area: % Reduction in Volume: Tunneling: No Undermining: No Wound Description Classification: Full Thickness Without Exposed Support Structu Exudate Amount: Small Exudate Type: Serous Exudate Color: amber res Foul Odor After Cleansing: No Slough/Fibrino Yes Wound Bed Granulation Amount: Medium (34-66%) Exposed Structure Granulation Quality: Red Fascia Exposed: No Necrotic Amount: Medium (34-66%) Fat Layer (Subcutaneous Tissue) Exposed: Yes Necrotic Quality: Adherent Slough Tendon Exposed: No Muscle Exposed: No Joint Exposed: No Bone Exposed: No Electronic Signature(s) Signed: 10/07/2020 5:35:33 PM By: [name], Jeremy Breeding RN Signed: 10/08/2020 8:09:31 AM By: [name] Entered By: [name] on 10/07/2020 11:09:28 [name] ([id number]) -------------------------------------------------------------------------------- Vitals Details Patient Name: KAHLE, MCQUEEN. Date of Service: 10/07/2020 10:45 AM Medical Record Number: [id number] Patient  Account Number: [id number] Date of Birth/Sex: May 26, 1929 (87 y.o. M) Treating RN: [name] Primary Care Mayanna Garlitz: [name] Other Clinician: Jeanine Wallace Referring Bryann Mcnealy: [name] Treating Dario Yono/Extender: [name] in Treatment: 6 Vital Signs Time Taken: 10:53 Temperature (F): 98.2 Height (in): 70 Pulse (bpm): 61 Weight (lbs): 170 Respiratory Rate (breaths/min): 16 Body Mass Index (BMI): 24.4 Blood Pressure (mmHg): 104/53 Reference Range: 80 - 120 mg / dl Electronic Signature(s) Signed: 10/08/2020 8:09:31 AM By: [name] Entered By: [name] on 10/07/2020 10:55:38

## 2020-10-10 LAB — AEROBIC CULTURE W GRAM STAIN (SUPERFICIAL SPECIMEN)

## 2020-10-15 ENCOUNTER — Encounter: Payer: Medicare Other | Admitting: Physician Assistant

## 2020-10-15 ENCOUNTER — Other Ambulatory Visit: Payer: Self-pay

## 2020-10-15 DIAGNOSIS — E11622 Type 2 diabetes mellitus with other skin ulcer: Secondary | ICD-10-CM | POA: Diagnosis not present

## 2020-10-15 NOTE — Progress Notes (Addendum)
KAHNER, YANIK ([id number]) Visit Report for 10/15/2020 Chief Complaint Document Details Patient Name: [name], [name]. Date of Service: 10/15/2020 2:30 PM Medical Record Number: [id number] Patient Account Number: [id number] Date of Birth/Sex: July 06, 1928 (87 y.o. M) Treating RN: Yevonne Pax Primary Care Provider: Mickey Farber Other Clinician: Lolita Cram Referring Provider: Mickey Farber Treating Provider/Extender: Rowan Blase in Treatment: 7 Information Obtained from: Patient Chief Complaint Bilateral gluteal abscesses Electronic Signature(s) Signed: 10/15/2020 2:43:12 PM By: Lenda Kelp PA-C Entered By: Lenda Kelp on 10/15/2020 14:43:12 Palazzo, [name] ([id number]) -------------------------------------------------------------------------------- HPI Details Patient Name: [name]. Date of Service: 10/15/2020 2:30 PM Medical Record Number: [id number] Patient Account Number: [id number] Date of Birth/Sex: 1928/10/03 (87 y.o. M) Treating RN: Yevonne Pax Primary Care Provider: Mickey Farber Other Clinician: Lolita Cram Referring Provider: Mickey Farber Treating Provider/Extender: Rowan Blase in Treatment: 7 History of Present Illness HPI Description: 08/26/2020 upon evaluation today patient presents for initial inspection here in our clinic concerning issues he is been having with his gluteal region. He has several abscesses 2 on the right 1 on the left that have been present since at least December. With that being said triamcinolone and mupirocin has been used currently along with Desitin unfortunately this just does not seem to be making the progress that they would like to see. Obviously I think that the patient is not really showing any signs of infection right now which is good he was on doxycycline as prescribed by urgent care until he was seen in the ER in hospital where they apparently stopped this. Patient does have diabetes. He does  also have a history of coronary artery disease. Subsequently his troponin was elevated in the hospital that is part of the reason why they kept him for analysis while he was there. Otherwise the patient seems to be doing well and I think he has a good chance of getting this to heal with appropriate treatment. 09/09/2020 upon evaluation today patient actually appears to be making good progress here with regard to his wounds. Fortunately there is no signs of active infection at this time. No fevers, chills, nausea, vomiting, or diarrhea. He has been tolerating the dressing changes without complication and his daughter-in-law is doing an excellent job taking care of this. 09/23/2020 upon evaluation today patient is making good progress in regard to the wounds in the gluteal region. He has been tolerating the dressing changes without complication and very pleased that he seems to be doing so well. There does not appear to be any evidence of active infection which is great news and overall I think that the patient is making wonderful progress. 10/07/20 upon evaluation today patient appears to be doing excellent in regard to his right gluteal region. He has a healed wound on the distal gluteal region and proximally this is measuring much better. That is great news. Unfortunately he did have a new area open up on the left gluteal region this does appear to be an abscess and does not have to be cleaned out. He has been on doxycycline as prescribed by urgent care. 10/15/2020 upon evaluation today patient appears to be doing well with regard to his wounds in the gluteal region. I feel like he is actually making good progress here. Fortunately there does not appear any signs of active infection which is great news. No fevers, chills, nausea, vomiting, or diarrhea. Electronic Signature(s) Signed: 10/15/2020 3:26:24 PM By: Lenda Kelp PA-C Entered By: Lenda Kelp on 10/15/2020  82:99:37 AZARYAH, OLEKSY  ([id number]) -------------------------------------------------------------------------------- Physical Exam Details Patient Name: [name]. Date of Service: 10/15/2020 2:30 PM Medical Record Number: [id number] Patient Account Number: [id number] Date of Birth/Sex: 14-Feb-1929 (87 y.o. M) Treating RN: Yevonne Pax Primary Care Provider: Mickey Farber Other Clinician: Lolita Cram Referring Provider: Mickey Farber Treating Provider/Extender: Rowan Blase in Treatment: 7 Constitutional Well-nourished and well-hydrated in no acute distress. Respiratory normal breathing without difficulty. Psychiatric this patient is able to make decisions and demonstrates good insight into disease process. Alert and Oriented x 3. pleasant and cooperative. Notes Patient's wound bed showed signs of good granulation epithelization at this point. There does not appear to be any evidence of infection which is great news and overall very pleased with where things stand today. I do think he is making progress as compared to the last visit and this is excellent news. I do think he does have a little bit of a fungal infection around the wound openings. Electronic Signature(s) Signed: 10/15/2020 3:26:55 PM By: Lenda Kelp PA-C Entered By: Lenda Kelp on 10/15/2020 15:26:54 Norberta Keens ([id number]) -------------------------------------------------------------------------------- Physician Orders Details Patient Name: [name]. Date of Service: 10/15/2020 2:30 PM Medical Record Number: [id number] Patient Account Number: [id number] Date of Birth/Sex: 01/25/1929 (87 y.o. M) Treating RN: Yevonne Pax Primary Care Provider: Mickey Farber Other Clinician: Lolita Cram Referring Provider: Mickey Farber Treating Provider/Extender: Rowan Blase in Treatment: 7 Verbal / Phone Orders: No Diagnosis Coding ICD-10 Coding Code Description L02.31 Cutaneous abscess of buttock L98.412  Non-pressure chronic ulcer of buttock with fat layer exposed E11.622 Type 2 diabetes mellitus with other skin ulcer I25.10 Atherosclerotic heart disease of native coronary artery without angina pectoris I50.42 Chronic combined systolic (congestive) and diastolic (congestive) heart failure Follow-up Appointments o Return Appointment in 1 week. Off-Loading o Turn and reposition every 2 hours Medications-Please add to medication list. o Other: - Apply Mupirocin intranasal and under finger nails 1 time a day for 1 week then weekly for next 4 weeks Wound Treatment Wound #1 - Gluteus Wound Laterality: Right, Proximal Cleanser: Hibiclens, 16 (oz) 1 x Per Day/30 Days Discharge Instructions: Put Hibiclenso on your skin head to toe and rub it in gently for five minutes with a washcloth. Turn the water back on and rinse very well with warm water. Do not use your regular soap after using and rinsing Hibiclenso. Pat yourself dry with a clean towel for a week. Peri-Wound Care: Skin Prep (DME) (Generic) 1 x Per Day/30 Days Discharge Instructions: Use skin prep as directed Topical: nystatin cream (Generic) 1 x Per Day/30 Days Discharge Instructions: apply to red periwound area Primary Dressing: Prisma 4.34 (in) (DME) (Generic) 1 x Per Day/30 Days Discharge Instructions: Moisten w/normal saline or sterile water; Cover wound as directed. Do not remove from wound bed. Secondary Dressing: ABD Pad 5x9 (in/in) (DME) (Generic) 1 x Per Day/30 Days Discharge Instructions: Cover with ABD pad Secured With: 73M Medipore H Soft Cloth Surgical Tape, 2x2 (in/yd) (DME) (Generic) 1 x Per Day/30 Days Wound #4 - Gluteus Wound Laterality: Left Cleanser: Hibiclens, 16 (oz) 1 x Per Day/30 Days Discharge Instructions: Put Hibiclenso on your skin head to toe and rub it in gently for five minutes with a washcloth. Turn the water back on and rinse very well with warm water. Do not use your regular soap after using and  rinsing Hibiclenso. Pat yourself dry with a clean towel for a week. Peri-Wound Care: Skin Prep (DME) (Generic) 1 x  Per Day/30 Days Discharge Instructions: Use skin prep as directed Topical: nystatin cream (Generic) 1 x Per Day/30 Days Discharge Instructions: apply to red periwound area Primary Dressing: Prisma 4.34 (in) (DME) (Generic) 1 x Per Day/30 Days Discharge Instructions: Moisten w/normal saline followed by plain packing strip . Norberta KeensBRADFORD, Hence J. ([id number]) Secondary Dressing: ABD Pad 5x9 (in/in) (DME) (Generic) 1 x Per Day/30 Days Discharge Instructions: Cover with ABD pad Secured With: 31M Medipore H Soft Cloth Surgical Tape, 2x2 (in/yd) (DME) (Generic) 1 x Per Day/30 Days Patient Medications Allergies: No Known Allergies Notifications Medication Indication Start End nystatin 10/15/2020 DOSE topical 100,000 unit/gram cream - cream topical applied on the red skin around the wound open with each dressing change daily for 30 days or until healed. Electronic Signature(s) Signed: 10/15/2020 5:20:58 PM By: Lenda KelpStone III, Linna Thebeau PA-C Signed: 10/18/2020 7:55:11 AM By: Yevonne PaxEpps, Carrie RN Previous Signature: 10/15/2020 3:28:57 PM Version By: Lenda KelpStone III, Sherian Valenza PA-C Entered By: Yevonne PaxEpps, Carrie on 10/15/2020 16:09:08 Spielmann, Malena CatholicALBERT J. ([id number]) -------------------------------------------------------------------------------- Problem List Details Patient Name: Norberta KeensBRADFORD, Nayel J. Date of Service: 10/15/2020 2:30 PM Medical Record Number: [id number] Patient Account Number: [id number] Date of Birth/Sex: 09/21/1928 (87 y.o. M) Treating RN: Yevonne PaxEpps, Carrie Primary Care Provider: Mickey Farberhies, David Other Clinician: Lolita CramBurnette, Kyara Referring Provider: Mickey Farberhies, David Treating Provider/Extender: Rowan BlaseStone, Janiel Crisostomo Weeks in Treatment: 7 Active Problems ICD-10 Encounter Code Description Active Date MDM Diagnosis L02.31 Cutaneous abscess of buttock 08/26/2020 No Yes L98.412 Non-pressure chronic ulcer of buttock with fat layer  exposed 08/26/2020 No Yes E11.622 Type 2 diabetes mellitus with other skin ulcer 08/26/2020 No Yes I25.10 Atherosclerotic heart disease of native coronary artery without angina 08/26/2020 No Yes pectoris I50.42 Chronic combined systolic (congestive) and diastolic (congestive) heart 08/26/2020 No Yes failure Inactive Problems Resolved Problems Electronic Signature(s) Signed: 10/15/2020 2:43:02 PM By: Lenda KelpStone III, Jessica Seidman PA-C Entered By: Lenda KelpStone III, Tecia Cinnamon on 10/15/2020 14:43:02 Hymes, Malena CatholicALBERT J. ([id number]) -------------------------------------------------------------------------------- Progress Note Details Patient Name: Norberta KeensBRADFORD, Maurice J. Date of Service: 10/15/2020 2:30 PM Medical Record Number: [id number] Patient Account Number: [id number] Date of Birth/Sex: 05/16/1929 (87 y.o. M) Treating RN: Yevonne PaxEpps, Carrie Primary Care Provider: Mickey Farberhies, David Other Clinician: Lolita CramBurnette, Kyara Referring Provider: Mickey Farberhies, David Treating Provider/Extender: Rowan BlaseStone, Pina Sirianni Weeks in Treatment: 7 Subjective Chief Complaint Information obtained from Patient Bilateral gluteal abscesses History of Present Illness (HPI) 08/26/2020 upon evaluation today patient presents for initial inspection here in our clinic concerning issues he is been having with his gluteal region. He has several abscesses 2 on the right 1 on the left that have been present since at least December. With that being said triamcinolone and mupirocin has been used currently along with Desitin unfortunately this just does not seem to be making the progress that they would like to see. Obviously I think that the patient is not really showing any signs of infection right now which is good he was on doxycycline as prescribed by urgent care until he was seen in the ER in hospital where they apparently stopped this. Patient does have diabetes. He does also have a history of coronary artery disease. Subsequently his troponin was elevated in the hospital that is part  of the reason why they kept him for analysis while he was there. Otherwise the patient seems to be doing well and I think he has a good chance of getting this to heal with appropriate treatment. 09/09/2020 upon evaluation today patient actually appears to be making good progress here with regard to his wounds. Fortunately there is no signs of active infection  at this time. No fevers, chills, nausea, vomiting, or diarrhea. He has been tolerating the dressing changes without complication and his daughter-in-law is doing an excellent job taking care of this. 09/23/2020 upon evaluation today patient is making good progress in regard to the wounds in the gluteal region. He has been tolerating the dressing changes without complication and very pleased that he seems to be doing so well. There does not appear to be any evidence of active infection which is great news and overall I think that the patient is making wonderful progress. 10/07/20 upon evaluation today patient appears to be doing excellent in regard to his right gluteal region. He has a healed wound on the distal gluteal region and proximally this is measuring much better. That is great news. Unfortunately he did have a new area open up on the left gluteal region this does appear to be an abscess and does not have to be cleaned out. He has been on doxycycline as prescribed by urgent care. 10/15/2020 upon evaluation today patient appears to be doing well with regard to his wounds in the gluteal region. I feel like he is actually making good progress here. Fortunately there does not appear any signs of active infection which is great news. No fevers, chills, nausea, vomiting, or diarrhea. Objective Constitutional Well-nourished and well-hydrated in no acute distress. Vitals Time Taken: 2:40 AM, Height: 70 in, Weight: 170 lbs, BMI: 24.4, Temperature: 97.5 F, Pulse: 54 bpm, Respiratory Rate: 20 breaths/min, Blood Pressure: 142/57  mmHg. Respiratory normal breathing without difficulty. Psychiatric this patient is able to make decisions and demonstrates good insight into disease process. Alert and Oriented x 3. pleasant and cooperative. General Notes: Patient's wound bed showed signs of good granulation epithelization at this point. There does not appear to be any evidence of infection which is great news and overall very pleased with where things stand today. I do think he is making progress as compared to the last visit and this is excellent news. I do think he does have a little bit of a fungal infection around the wound openings. Integumentary (Hair, Skin) Wound #1 status is Open. Original cause of wound was Gradually Appeared. The date acquired was: 08/03/2020. The wound has been in treatment 7 weeks. The wound is located on the Right,Proximal Gluteus. The wound measures 0.3cm length x 0.4cm width x 0.3cm depth; 0.094cm^2 area and 0.028cm^3 volume. There is Fat Layer (Subcutaneous Tissue) exposed. There is no tunneling or undermining noted. There is a medium amount of serosanguineous drainage noted. There is small (1-33%) red, pink granulation within the wound bed. There is a small (1-33%) amount of necrotic tissue within the wound bed including Adherent Slough. EMANUEL, DOWSON ([id number]) Wound #4 status is Open. Original cause of wound was Gradually Appeared. The date acquired was: 09/23/2020. The wound has been in treatment 1 weeks. The wound is located on the Left Gluteus. The wound measures 0.4cm length x 0.5cm width x 0.8cm depth; 0.157cm^2 area and 0.126cm^3 volume. There is Fat Layer (Subcutaneous Tissue) exposed. There is no tunneling noted, however, there is undermining starting at 12:00 and ending at 6:00 with a maximum distance of 0.5cm. There is a medium amount of serous drainage noted. There is medium (34-66%) red granulation within the wound bed. There is a medium (34-66%) amount of necrotic tissue within  the wound bed including Adherent Slough. Assessment Active Problems ICD-10 Cutaneous abscess of buttock Non-pressure chronic ulcer of buttock with fat layer exposed Type 2 diabetes mellitus  with other skin ulcer Atherosclerotic heart disease of native coronary artery without angina pectoris Chronic combined systolic (congestive) and diastolic (congestive) heart failure Plan Follow-up Appointments: Return Appointment in 1 week. Off-Loading: Turn and reposition every 2 hours Medications-Please add to medication list.: Other: - Apply Mupirocin intranasal and under finger nails 1 time a day for 1 week then weekly for next 4 weeks The following medication(s) was prescribed: nystatin topical 100,000 unit/gram cream cream topical applied on the red skin around the wound open with each dressing change daily for 30 days or until healed. starting 10/15/2020 WOUND #1: - Gluteus Wound Laterality: Right, Proximal Cleanser: Hibiclens, 16 (oz) 1 x Per Day/30 Days Discharge Instructions: Put Hibiclens on your skin head to toe and rub it in gently for five minutes with a washcloth. Turn the water back on and rinse very well with warm water. Do not use your regular soap after using and rinsing Hibiclens. Pat yourself dry with a clean towel for a week. Peri-Wound Care: Skin Prep (DME) (Generic) 1 x Per Day/30 Days Discharge Instructions: Use skin prep as directed Topical: nystatin cream (Generic) 1 x Per Day/30 Days Discharge Instructions: apply to red periwound area Primary Dressing: Prisma 4.34 (in) (DME) (Generic) 1 x Per Day/30 Days Discharge Instructions: Moisten w/normal saline or sterile water; Cover wound as directed. Do not remove from wound bed. Secondary Dressing: ABD Pad 5x9 (in/in) (DME) (Generic) 1 x Per Day/30 Days Discharge Instructions: Cover with ABD pad Secured With: 67M Medipore H Soft Cloth Surgical Tape, 2x2 (in/yd) (DME) (Generic) 1 x Per Day/30 Days WOUND #4: - Gluteus Wound  Laterality: Left Cleanser: Hibiclens, 16 (oz) 1 x Per Day/30 Days Discharge Instructions: Put Hibiclens on your skin head to toe and rub it in gently for five minutes with a washcloth. Turn the water back on and rinse very well with warm water. Do not use your regular soap after using and rinsing Hibiclens. Pat yourself dry with a clean towel for a week. Peri-Wound Care: Skin Prep (DME) (Generic) 1 x Per Day/30 Days Discharge Instructions: Use skin prep as directed Topical: nystatin cream (Generic) 1 x Per Day/30 Days Discharge Instructions: apply to red periwound area Primary Dressing: Prisma 4.34 (in) (DME) (Generic) 1 x Per Day/30 Days Discharge Instructions: Moisten w/normal saline followed by plain packing strip . Secondary Dressing: ABD Pad 5x9 (in/in) (DME) (Generic) 1 x Per Day/30 Days Discharge Instructions: Cover with ABD pad Secured With: 67M Medipore H Soft Cloth Surgical Tape, 2x2 (in/yd) (DME) (Generic) 1 x Per Day/30 Days 1. Would recommend currently that we go ahead and initiate treatment with a continuation of the collagen into the wounds on the left were also can use little bit of plain packing strip and behind. 2. I am also can recommend nystatin cream around the edges of the wound in order to help with controlling the fungal infection. 3. I am also can recommend that we use an ABD pad to secure along with tape around the edges of the border. I think this can be better than Tegaderm due to the moisture that were seen. We will see patient back for reevaluation in 2 weeks here in the clinic. If anything worsens or changes patient will contact our office for additional recommendations. SAAHIR, PRUDE ([id number]) Electronic Signature(s) Signed: 10/15/2020 3:29:03 PM By: Lenda Kelp PA-C Entered By: Lenda Kelp on 10/15/2020 15:29:02 KAZUO, DURNIL ([id number]) -------------------------------------------------------------------------------- SuperBill  Details Patient Name: SHARBEL, SAHAGUN. Date of Service: 10/15/2020 Medical  Record Number: [id number] Patient Account Number: [id number] Date of Birth/Sex: 1929-05-10 (87 y.o. M) Treating RN: Yevonne Pax Primary Care Provider: Mickey Farber Other Clinician: Lolita Cram Referring Provider: Mickey Farber Treating Provider/Extender: Rowan Blase in Treatment: 7 Diagnosis Coding ICD-10 Codes Code Description L02.31 Cutaneous abscess of buttock L98.412 Non-pressure chronic ulcer of buttock with fat layer exposed E11.622 Type 2 diabetes mellitus with other skin ulcer I25.10 Atherosclerotic heart disease of native coronary artery without angina pectoris I50.42 Chronic combined systolic (congestive) and diastolic (congestive) heart failure Facility Procedures CPT4 Code: 40981191 Description: 99213 - WOUND CARE VISIT-LEV 3 EST PT Modifier: Quantity: 1 Physician Procedures CPT4 Code: 4782956 Description: 99214 - WC PHYS LEVEL 4 - EST PT Modifier: Quantity: 1 CPT4 Code: Description: ICD-10 Diagnosis Description L02.31 Cutaneous abscess of buttock L98.412 Non-pressure chronic ulcer of buttock with fat layer exposed E11.622 Type 2 diabetes mellitus with other skin ulcer I25.10 Atherosclerotic heart disease of native  coronary artery without angina Modifier: pectoris Quantity: Electronic Signature(s) Signed: 10/15/2020 3:29:13 PM By: Lenda Kelp PA-C Entered By: Lenda Kelp on 10/15/2020 15:29:13

## 2020-10-18 NOTE — Progress Notes (Signed)
ADRIENNE, Wallace ([id number]) Visit Report for 10/15/2020 Arrival Information Details Patient Name: [name], [name]. Date of Service: 10/15/2020 2:30 PM Medical Record Number: [id number] Patient Account Number: [id number] Date of Birth/Sex: 1928/09/01 (87 y.o. M) Treating RN: Carlene Coria Primary Care Alric Geise: Ezequiel Kayser Other Clinician: Jeanine Luz Referring Camry Theiss: Ezequiel Kayser Treating Lukis Bunt/Extender: Skipper Cliche in Treatment: 7 Visit Information History Since Last Visit Added or deleted any medications: No Patient Arrived: [name] Had a fall or experienced change in No Arrival Time: 14:38 activities of daily living that may affect Accompanied By: wife risk of falls: Transfer Assistance: None Hospitalized since last visit: No Patient Identification Verified: Yes Pain Present Now: No Secondary Verification Process Completed: Yes Patient Requires Transmission-Based Precautions: No Patient Has Alerts: No Electronic Signature(s) Signed: 10/15/2020 4:49:43 PM By: Jeanine Luz Entered By: Jeanine Luz on 10/15/2020 14:38:54 [name], [name] ([id number]) -------------------------------------------------------------------------------- Clinic Level of Care Assessment Details Patient Name: [name]. Date of Service: 10/15/2020 2:30 PM Medical Record Number: [id number] Patient Account Number: [id number] Date of Birth/Sex: March 07, 1929 (87 y.o. M) Treating RN: Carlene Coria Primary Care Meridith Romick: Ezequiel Kayser Other Clinician: Jeanine Luz Referring Mussa Groesbeck: Ezequiel Kayser Treating Annibelle Brazie/Extender: Skipper Cliche in Treatment: 7 Clinic Level of Care Assessment Items TOOL 4 Quantity Score X - Use when only an EandM is performed on FOLLOW-UP visit 1 0 ASSESSMENTS - Nursing Assessment / Reassessment X - Reassessment of Co-morbidities (includes updates in patient status) 1 10 X- 1 5 Reassessment of Adherence to Treatment Plan ASSESSMENTS - Wound  and Skin Assessment / Reassessment _0  - Simple Wound Assessment / Reassessment - one wound 0 X- 2 5 Complex Wound Assessment / Reassessment - multiple wounds _1  - 0 Dermatologic / Skin Assessment (not related to wound area) ASSESSMENTS - Focused Assessment _2  - Circumferential Edema Measurements - multi extremities 0 _3  - 0 Nutritional Assessment / Counseling / Intervention _4  - 0 Lower Extremity Assessment (monofilament, tuning fork, pulses) _5  - 0 Peripheral Arterial Disease Assessment (using hand held doppler) ASSESSMENTS - Ostomy and/or Continence Assessment and Care _6  - Incontinence Assessment and Management 0 _7  - 0 Ostomy Care Assessment and Management (repouching, etc.) PROCESS - Coordination of Care X - Simple Patient / Family Education for ongoing care 1 15 _8  - 0 Complex (extensive) Patient / Family Education for ongoing care _9  - 0 Staff obtains Programmer, systems, Records, Test Results / Process Orders _10  - 0 Staff telephones HHA, Nursing Homes / Clarify orders / etc _11  - 0 Routine Transfer to another Facility (non-emergent condition) _12  - 0 Routine Hospital Admission (non-emergent condition) _13  - 0 New Admissions / Biomedical engineer / Ordering NPWT, Apligraf, etc. _14  - 0 Emergency Hospital Admission (emergent condition) X- 1 10 Simple Discharge Coordination _15  - 0 Complex (extensive) Discharge Coordination PROCESS - Special Needs _16  - Pediatric / Minor Patient Management 0 _17  - 0 Isolation Patient Management _18  - 0 Hearing / Language / Visual special needs _19  - 0 Assessment of Community assistance (transportation, D/C planning, etc.) _20  - 0 Additional assistance / Altered mentation _21  - 0 Support Surface(s) Assessment (bed, cushion, seat, etc.) INTERVENTIONS - Wound Cleansing / Measurement Dubiel, Yasiel J. ([id number]) _22  - 0 Simple Wound Cleansing - one wound X- 2 5 Complex Wound Cleansing - multiple wounds X- 1 5 Wound Imaging (photographs -  any number of wounds) _23  - 0 Wound Tracing (instead of photographs) _24  - 0 Simple Wound Measurement - one wound X- 2 5 Complex Wound Measurement - multiple wounds  INTERVENTIONS - Wound Dressings X - Small Wound Dressing one or multiple wounds 2 10 _0  - 0 Medium Wound Dressing one or multiple wounds _1  - 0 Large Wound Dressing one or multiple wounds X- 1 5 Application of Medications - topical <[id number]>_1<\/[id number]>_4  - 0 Application of Medications - injection INTERVENTIONS - Miscellaneous _3  - External ear exam 0 _4  - 0 Specimen Collection (cultures, biopsies, blood, body fluids, etc.) _5  - 0 Specimen(s) / Culture(s) sent or taken to Lab for analysis _6  - 0 Patient Transfer (multiple staff / [name] Lemon Lift / Similar devices) _7  - 0 Simple Staple / Suture removal (25 or less) _8  - 0 Complex Staple / Suture removal (26 or more) _9  - 0 Hypo / Hyperglycemic Management (close monitor of Blood Glucose) _10  - 0 Ankle / Brachial Index (ABI) - do not check if billed separately X- 1 5 Vital Signs Has the patient been seen at the hospital within the last three years: Yes Total Score: 105 Level Of Care: New/Established - Level 3 Electronic Signature(s) Signed: 10/18/2020 7:55:11 AM By: Carlene Coria RN Entered By: Carlene Coria on 10/15/2020 15:28:37 [name] ([id number]) -------------------------------------------------------------------------------- Encounter Discharge Information Details Patient Name: [name], [name]. Date of Service: 10/15/2020 2:30 PM Medical Record Number: [id number] Patient Account Number: [id number] Date of Birth/Sex: February 08, 1929 (87 y.o. M) Treating RN: Donnamarie Poag Primary Care Caliana Spires: Ezequiel Kayser Other Clinician: Jeanine Luz Referring Mei Suits: Ezequiel Kayser Treating Stevie Ertle/Extender: Skipper Cliche in Treatment: 7 Encounter Discharge Information Items Discharge Condition: Stable Ambulatory Status: Walker Discharge Destination: Home Transportation: Private  Auto Accompanied By: wife Schedule Follow-up Appointment: Yes Clinical Summary of Care: Electronic Signature(s) Signed: 10/15/2020 3:53:44 PM By: Donnamarie Poag Entered By: Donnamarie Poag on 10/15/2020 15:35:19 [name], [name] ([id number]) -------------------------------------------------------------------------------- Lower Extremity Assessment Details Patient Name: [name], [name]. Date of Service: 10/15/2020 2:30 PM Medical Record Number: [id number] Patient Account Number: [id number] Date of Birth/Sex: 1929/05/12 (87 y.o. M) Treating RN: Carlene Coria Primary Care Finas Delone: Ezequiel Kayser Other Clinician: Jeanine Luz Referring Elbony Mcclimans: Ezequiel Kayser Treating Modean Mccullum/Extender: Skipper Cliche in Treatment: 7 Electronic Signature(s) Signed: 10/15/2020 4:49:43 PM By: Jeanine Luz Signed: 10/18/2020 7:55:11 AM By: Carlene Coria RN Entered By: Jeanine Luz on 10/15/2020 14:57:55 [name], [name] ([id number]) -------------------------------------------------------------------------------- Multi Wound Chart Details Patient Name: [name], [name]. Date of Service: 10/15/2020 2:30 PM Medical Record Number: [id number] Patient Account Number: [id number] Date of Birth/Sex: 08/16/1928 (87 y.o. M) Treating RN: Carlene Coria Primary Care Eldine Rencher: Ezequiel Kayser Other Clinician: Jeanine Luz Referring Mayia Megill: Ezequiel Kayser Treating Nitya Cauthon/Extender: Skipper Cliche in Treatment: 7 Vital Signs Height(in): 70 Pulse(bpm): 65 Weight(lbs): 170 Blood Pressure(mmHg): 142/57 Body Mass Index(BMI): 24 Temperature(F): 97.5 Respiratory Rate(breaths/min): 20 Photos: [N/A:N/A] Wound Location: Right, Proximal Gluteus Left Gluteus N/A Wounding Event: Gradually Appeared Gradually Appeared N/A Primary Etiology: Abscess Abscess N/A Date Acquired: 08/03/2020 09/23/2020 N/A Weeks of Treatment: 7 1 N/A Wound Status: Open Open N/A Measurements L x W x D (cm) 0.3x0.4x0.3 0.4x0.5x0.8 N/A Area  (cm) : 0.094 0.157 N/A Volume (cm) : 0.028 0.126 N/A % Reduction in Area: 65.80% 42.90% N/A % Reduction in Volume: 65.90% -366.70% N/A Starting Position 1 (o'clock): 12 Ending Position 1 (o'clock): 6 Maximum Distance 1 (cm): 0.5 Undermining: No Yes N/A Classification: Full Thickness Without Exposed Full Thickness Without Exposed N/A Support Structures Support Structures Exudate Amount: Small Small N/A Exudate Type: Serous Serous N/A Exudate Color: amber amber N/A Granulation Amount: Small (1-33%) Medium (34-66%) N/A Granulation Quality: Red, Pink Red N/A Necrotic Amount: Small (1-33%)  Medium (34-66%) N/A Exposed Structures: Fat Layer (Subcutaneous Tissue): Fat Layer (Subcutaneous Tissue): N/A Yes Yes Fascia: No Fascia: No Tendon: No Tendon: No Muscle: No Muscle: No Joint: No Joint: No Bone: No Bone: No Epithelialization: None N/A N/A Treatment Notes Electronic Signature(s) Signed: 10/18/2020 7:55:11 AM By: Carlene Coria RN Entered By: Carlene Coria on 10/15/2020 15:12:23 [name], [name] ([id number]) -------------------------------------------------------------------------------- [place] Details Patient Name: [name], [name]. Date of Service: 10/15/2020 2:30 PM Medical Record Number: [id number] Patient Account Number: [id number] Date of Birth/Sex: 03/04/1929 (87 y.o. M) Treating RN: Carlene Coria Primary Care Alyssandra Hulsebus: Ezequiel Kayser Other Clinician: Jeanine Luz Referring Georgi Tuel: Ezequiel Kayser Treating Nikyah Lackman/Extender: Skipper Cliche in Treatment: 7 Active Inactive Wound/Skin Impairment Nursing Diagnoses: Impaired tissue integrity Goals: Patient/caregiver will verbalize understanding of skin care regimen Date Initiated: 08/26/2020 Date Inactivated: 09/09/2020 Target Resolution Date: 08/26/2020 Goal Status: Met Ulcer/skin breakdown will have a volume reduction of 30% by week 4 Date Initiated: 08/26/2020 Date Inactivated:  09/23/2020 Target Resolution Date: 09/23/2020 Goal Status: Met Ulcer/skin breakdown will have a volume reduction of 50% by week 8 Date Initiated: 08/26/2020 Target Resolution Date: 10/24/2020 Goal Status: Active Ulcer/skin breakdown will have a volume reduction of 80% by week 12 Date Initiated: 08/26/2020 Target Resolution Date: 11/23/2020 Goal Status: Active Interventions: Assess patient/caregiver ability to obtain necessary supplies Assess patient/caregiver ability to perform ulcer/skin care regimen upon admission and as needed Assess ulceration(s) every visit Provide education on ulcer and skin care Treatment Activities: Skin care regimen initiated : 08/26/2020 Topical wound management initiated : 08/26/2020 Notes: Electronic Signature(s) Signed: 10/18/2020 7:55:11 AM By: Carlene Coria RN Entered By: Carlene Coria on 10/15/2020 15:12:02 [place], [name] ([id number]) -------------------------------------------------------------------------------- Pain Assessment Details Patient Name: [name], [name]. Date of Service: 10/15/2020 2:30 PM Medical Record Number: [id number] Patient Account Number: [id number] Date of Birth/Sex: 22-Sep-1928 (87 y.o. M) Treating RN: Carlene Coria Primary Care Kaleiah Kutzer: Ezequiel Kayser Other Clinician: Jeanine Luz Referring Jodiann Ognibene: Ezequiel Kayser Treating Chantae Soo/Extender: Skipper Cliche in Treatment: 7 Active Problems Location of Pain Severity and Description of Pain Patient Has Paino Yes Site Locations Rate the pain. Current Pain Level: 2 Pain Management and Medication Current Pain Management: Notes only has pain when sitting Electronic Signature(s) Signed: 10/15/2020 4:49:43 PM By: Jeanine Luz Signed: 10/18/2020 7:55:11 AM By: Carlene Coria RN Entered By: Jeanine Luz on 10/15/2020 14:44:05 Bazen, [name] ([id number]) -------------------------------------------------------------------------------- Patient/Caregiver Education  Details Patient Name: [name], [name]. Date of Service: 10/15/2020 2:30 PM Medical Record Number: [id number] Patient Account Number: [id number] Date of Birth/Gender: Feb 26, 1929 (87 y.o. M) Treating RN: Carlene Coria Primary Care Physician: Ezequiel Kayser Other Clinician: Jeanine Luz Referring Physician: Ezequiel Kayser Treating Physician/Extender: Skipper Cliche in Treatment: 7 Education Assessment Education Provided To: Patient Education Topics Provided Wound/Skin Impairment: Methods: Explain/Verbal Responses: State content correctly Electronic Signature(s) Signed: 10/18/2020 7:55:11 AM By: Carlene Coria RN Entered By: Carlene Coria on 10/15/2020 15:28:54 [name] ([id number]) -------------------------------------------------------------------------------- Wound Assessment Details Patient Name: [name], [name]. Date of Service: 10/15/2020 2:30 PM Medical Record Number: [id number] Patient Account Number: [id number] Date of Birth/Sex: 02/11/1929 (87 y.o. M) Treating RN: Carlene Coria Primary Care Samar Venneman: Ezequiel Kayser Other Clinician: Jeanine Luz Referring Ahuva Poynor: Ezequiel Kayser Treating Arneshia Ade/Extender: Skipper Cliche in Treatment: 7 Wound Status Wound Number: 1 Primary Etiology: Abscess Wound Location: Right, Proximal Gluteus Wound Status: Open Wounding Event: Gradually Appeared Date Acquired: 08/03/2020 Weeks Of Treatment: 7 Clustered Wound: No Photos Wound Measurements Length: (cm) 0.3 Width: (cm) 0.4 Depth: (cm) 0.3 Area: (cm)  0.094 Volume: (cm) 0.028 % Reduction in Area: 65.8% % Reduction in Volume: 65.9% Epithelialization: None Tunneling: No Undermining: No Wound Description Classification: Full Thickness Without Exposed Support Structures Exudate Amount: Medium Exudate Type: Serosanguineous Exudate Color: red, brown Foul Odor After Cleansing: No Slough/Fibrino Yes Wound Bed Granulation Amount: Small (1-33%) Exposed  Structure Granulation Quality: Red, Pink Fascia Exposed: No Necrotic Amount: Small (1-33%) Fat Layer (Subcutaneous Tissue) Exposed: Yes Necrotic Quality: Adherent Slough Tendon Exposed: No Muscle Exposed: No Joint Exposed: No Bone Exposed: No Treatment Notes Wound #1 (Gluteus) Wound Laterality: Right, Proximal Cleanser Hibiclens, 16 (oz) Discharge Instruction: Put Hibiclenso on your skin head to toe and rub it in gently for five minutes with a washcloth. Turn the water back on and rinse very well with warm water. Do not use your regular soap after using and rinsing Hibiclenso. Pat yourself dry with a clean towel for a week. BARUCH, LEWERS ([id number]) Peri-Wound Care Skin Prep Discharge Instruction: Use skin prep as directed Topical nystatin cream Discharge Instruction: apply to red periwound area Primary Dressing Prisma 4.34 (in) Discharge Instruction: Moisten w/normal saline or sterile water; Cover wound as directed. Do not remove from wound bed. Secondary Dressing ABD Pad 5x9 (in/in) Discharge Instruction: Cover with ABD pad Secured With 25M Medipore H Soft Cloth Surgical Tape, 2x2 (in/yd) Compression Wrap Compression Stockings Add-Ons Electronic Signature(s) Signed: 10/18/2020 7:55:11 AM By: Carlene Coria RN Entered By: Carlene Coria on 10/15/2020 15:22:05 [name] ([id number]) -------------------------------------------------------------------------------- Wound Assessment Details Patient Name: [name], [name]. Date of Service: 10/15/2020 2:30 PM Medical Record Number: [id number] Patient Account Number: [id number] Date of Birth/Sex: Mar 25, 1929 (87 y.o. M) Treating RN: Carlene Coria Primary Care Ascher Schroepfer: Ezequiel Kayser Other Clinician: Jeanine Luz Referring Bryana Froemming: Ezequiel Kayser Treating Kealy Lewter/Extender: Skipper Cliche in Treatment: 7 Wound Status Wound Number: 4 Primary Etiology: Abscess Wound Location: Left Gluteus Wound Status:  Open Wounding Event: Gradually Appeared Date Acquired: 09/23/2020 Weeks Of Treatment: 1 Clustered Wound: No Photos Wound Measurements Length: (cm) 0.4 Width: (cm) 0.5 Depth: (cm) 0.8 Area: (cm) 0.157 Volume: (cm) 0.126 % Reduction in Area: 42.9% % Reduction in Volume: -366.7% Tunneling: No Undermining: Yes Starting Position (o'clock): 12 Ending Position (o'clock): 6 Maximum Distance: (cm) 0.5 Wound Description Classification: Full Thickness Without Exposed Support Structu Exudate Amount: Medium Exudate Type: Serous Exudate Color: amber res Foul Odor After Cleansing: No Slough/Fibrino Yes Wound Bed Granulation Amount: Medium (34-66%) Exposed Structure Granulation Quality: Red Fascia Exposed: No Necrotic Amount: Medium (34-66%) Fat Layer (Subcutaneous Tissue) Exposed: Yes Necrotic Quality: Adherent Slough Tendon Exposed: No Muscle Exposed: No Joint Exposed: No Bone Exposed: No Treatment Notes Wound #4 (Gluteus) Wound Laterality: Left Cleanser Hibiclens, 16 (oz) Brensinger, Reyden J. ([id number]) Discharge Instruction: Put Hibiclenso on your skin head to toe and rub it in gently for five minutes with a washcloth. Turn the water back on and rinse very well with warm water. Do not use your regular soap after using and rinsing Hibiclenso. Pat yourself dry with a clean towel for a week. Peri-Wound Care Skin Prep Discharge Instruction: Use skin prep as directed Topical nystatin cream Discharge Instruction: apply to red periwound area Primary Dressing Prisma 4.34 (in) Discharge Instruction: Moisten w/normal saline followed by plain packing strip . Secondary Dressing ABD Pad 5x9 (in/in) Discharge Instruction: Cover with ABD pad Secured With 25M Medipore H Soft Cloth Surgical Tape, 2x2 (in/yd) Compression Wrap Compression Stockings Add-Ons Electronic Signature(s) Signed: 10/18/2020 7:55:11 AM By: Carlene Coria RN Entered By: Carlene Coria on 10/15/2020  15:22:19  IRIS, TATSCH ([id number]) -------------------------------------------------------------------------------- Vitals Details Patient Name: JULIEN, OSCAR. Date of Service: 10/15/2020 2:30 PM Medical Record Number: [id number] Patient Account Number: [id number] Date of Birth/Sex: Mar 06, 1929 (87 y.o. M) Treating RN: Carlene Coria Primary Care Heavenleigh Petruzzi: Ezequiel Kayser Other Clinician: Jeanine Luz Referring Anivea Velasques: Ezequiel Kayser Treating Chardai Gangemi/Extender: Skipper Cliche in Treatment: 7 Vital Signs Time Taken: 02:40 Temperature (F): 97.5 Height (in): 70 Pulse (bpm): 54 Weight (lbs): 170 Respiratory Rate (breaths/min): 20 Body Mass Index (BMI): 24.4 Blood Pressure (mmHg): 142/57 Reference Range: 80 - 120 mg / dl Electronic Signature(s) Signed: 10/15/2020 4:49:43 PM By: Jeanine Luz Entered By: Jeanine Luz on 10/15/2020 14:43:17

## 2020-10-21 ENCOUNTER — Other Ambulatory Visit: Payer: Self-pay

## 2020-10-21 ENCOUNTER — Encounter: Payer: Medicare Other | Admitting: Physician Assistant

## 2020-10-21 DIAGNOSIS — E11622 Type 2 diabetes mellitus with other skin ulcer: Secondary | ICD-10-CM | POA: Diagnosis not present

## 2020-10-21 NOTE — Progress Notes (Signed)
Jeremy, Wallace ([id number]) Visit Report for 10/21/2020 Arrival Information Details Patient Name: [name], [name]. Date of Service: 10/21/2020 9:45 AM Medical Record Number: [id number] Patient Account Number: [id number] Date of Birth/Sex: May 02, 1929 (87 y.o. M) Treating RN: [name] Primary Care [name]: [name] Other Clinician: Jeanine Wallace Referring [name]: [name] Treating [name]/Extender: [name] in Treatment: 8 Visit Information History Since Last Visit Added or deleted any medications: No Patient Arrived: [name] Had a fall or experienced change in No Arrival Time: 09:43 activities of daily living that may affect Accompanied By: daughter in law risk of falls: Transfer Assistance: None Hospitalized since last visit: No Patient Identification Verified: Yes Has Dressing in Place as Prescribed: Yes Secondary Verification Process Completed: Yes Pain Present Now: No Patient Requires Transmission-Based Precautions: No Patient Has Alerts: No Electronic Signature(s) Signed: 10/21/2020 2:33:38 PM By: [name] Entered By: [name] on 10/21/2020 09:44:13 [name], [name] ([id number]) -------------------------------------------------------------------------------- Clinic Level of Care Assessment Details Patient Name: [name], [name]. Date of Service: 10/21/2020 9:45 AM Medical Record Number: [id number] Patient Account Number: [id number] Date of Birth/Sex: 10-07-1928 (87 y.o. M) Treating RN: [name] Primary Care [name]: [name] Other Clinician: Jeanine Wallace Referring [name]: [name] Treating Elye Harmsen/Extender: [name] in Treatment: 8 Clinic Level of Care Assessment Items TOOL 1 Quantity Score [] - Use when EandM and Procedure is performed on INITIAL visit 0 ASSESSMENTS - Nursing Assessment / Reassessment [] - General Physical Exam (combine w/ comprehensive assessment (listed just below) when performed on  new 0 pt. evals) [] - 0 Comprehensive Assessment (HX, ROS, Risk Assessments, Wounds Hx, etc.) ASSESSMENTS - Wound and Skin Assessment / Reassessment [] - Dermatologic / Skin Assessment (not related to wound area) 0 ASSESSMENTS - Ostomy and/or Continence Assessment and Care [] - Incontinence Assessment and Management 0 [] - 0 Ostomy Care Assessment and Management (repouching, etc.) PROCESS - Coordination of Care [] - Simple Patient / Family Education for ongoing care 0 [] - 0 Complex (extensive) Patient / Family Education for ongoing care [] - 0 Staff obtains Consents, Records, Test Results / Process Orders [] - 0 Staff telephones HHA, Nursing Homes / Clarify orders / etc [] - 0 Routine Transfer to another Facility (non-emergent condition) [] - 0 Routine Hospital Admission (non-emergent condition) [] - 0 New Admissions / Biomedical engineer / Ordering NPWT, Apligraf, etc. [] - 0 Emergency Hospital Admission (emergent condition) PROCESS - Special Needs [] - Pediatric / Minor Patient Management 0 [] - 0 Isolation Patient Management [] - 0 Hearing / Language / Visual special needs [] - 0 Assessment of Community assistance (transportation, D/C planning, etc.) [] - 0 Additional assistance / Altered mentation [] - 0 Support Surface(s) Assessment (bed, cushion, seat, etc.) INTERVENTIONS - Miscellaneous [] - External ear exam 0 [] - 0 Patient Transfer (multiple staff / Civil Service fast streamer / Similar devices) [] - 0 Simple Staple / Suture removal (25 or less) [] - 0 Complex Staple / Suture removal (26 or more) [] - 0 Hypo/Hyperglycemic Management (do not check if billed separately) [] - 0 Ankle / Brachial Index (ABI) - do not check if billed separately Has the patient been seen at the hospital within the last three years: Yes Total Score: 0 Level Of Care: ____ [name] ([id number]) Electronic Signature(s) Signed: 10/21/2020 5:15:58 PM By: [name], Jeremy Breeding  RN Entered By: [name] on 10/21/2020 10:31:38 [name] ([id number]) -------------------------------------------------------------------------------- Encounter Discharge Information Details Patient Name: [name],  Jeremy J. Date of Service: 10/21/2020 9:45 AM Medical Record Number: [id number] Patient Account Number: [id number] Date of Birth/Sex: 01/13/1929 (87 y.o. M) Treating RN: [name] Primary Care [name]: [name] Other Clinician: Jeanine Wallace Referring [name]: [name] Treating [name]: [name] in Treatment: 8 Encounter Discharge Information Items Post Procedure Vitals Discharge Condition: Stable Temperature (F): 97.7 Ambulatory Status: Walker Pulse (bpm): 69 Discharge Destination: Home Respiratory Rate (breaths/min): 16 Transportation: Private Auto Blood Pressure (mmHg): 105/61 Accompanied By: daughter in law Schedule Follow-up Appointment: Yes Clinical Summary of Care: Electronic Signature(s) Signed: 10/21/2020 2:33:38 PM By: [name] Entered By: [name] on 10/21/2020 10:43:18 [name], [name] ([id number]) -------------------------------------------------------------------------------- Lower Extremity Assessment Details Patient Name: [name], [name]. Date of Service: 10/21/2020 9:45 AM Medical Record Number: [id number] Patient Account Number: [id number] Date of Birth/Sex: 02/02/1929 (87 y.o. M) Treating RN: [name] Primary Care [name]: [name] Other Clinician: Jeanine Wallace Referring [name]: [name] Treating Jackline Castilla/Extender: [name] in Treatment: 8 Electronic Signature(s) Signed: 10/21/2020 2:33:38 PM By: [name] Entered By: [name] on 10/21/2020 09:54:40 [name], [name] ([id number]) -------------------------------------------------------------------------------- Multi Wound Chart Details Patient Name: [name], [name]. Date of Service: 10/21/2020 9:45  AM Medical Record Number: [id number] Patient Account Number: [id number] Date of Birth/Sex: [birth year] (87 y.o. M) Treating RN: [name] Primary Care Patric Buckhalter: [name] Other Clinician: Jeanine Wallace Referring Kimiye Strathman: [name] Treating Kendale Rembold/Extender: [name] in Treatment: 8 Vital Signs Height(in): 70 Pulse(bpm): 81 Weight(lbs): 170 Blood Pressure(mmHg): 105/61 Body Mass Index(BMI): 24 Temperature(F): 97.7 Respiratory Rate(breaths/min): 16 Photos: [N/A:N/A] Wound Location: Right, Proximal Gluteus Left Gluteus N/A Wounding Event: Gradually Appeared Gradually Appeared N/A Primary Etiology: Abscess Abscess N/A Date Acquired: 08/03/2020 09/23/2020 N/A Weeks of Treatment: 8 2 N/A Wound Status: Open Open N/A Measurements L x W x D (cm) 0.1x0.1x0.1 0x0.3x0.8 N/A Area (cm) : 0.008 0.024 N/A Volume (cm) : 0.001 0.019 N/A % Reduction in Area: 97.10% 91.30% N/A % Reduction in Volume: 98.80% 29.60% N/A Classification: Full Thickness Without Exposed Full Thickness Without Exposed N/A Support Structures Support Structures Exudate Amount: Medium Medium N/A Exudate Type: Serosanguineous Serous N/A Exudate Color: red, brown amber N/A Granulation Amount: None Present (0%) Medium (34-66%) N/A Granulation Quality: N/A Red N/A Necrotic Amount: None Present (0%) Medium (34-66%) N/A Exposed Structures: Fat Layer (Subcutaneous Tissue): Fat Layer (Subcutaneous Tissue): N/A Yes Yes Fascia: No Fascia: No Tendon: No Tendon: No Muscle: No Muscle: No Joint: No Joint: No Bone: No Bone: No Epithelialization: None N/A N/A Treatment Notes Electronic Signature(s) Signed: 10/21/2020 5:15:58 PM By: [name], Jeremy Breeding RN Entered By: [name] on 10/21/2020 10:23:08 [name] ([id number]) -------------------------------------------------------------------------------- Multi-Disciplinary Care Plan Details Patient Name: [name], [name]. Date  of Service: 10/21/2020 9:45 AM Medical Record Number: [id number] Patient Account Number: [id number] Date of Birth/Sex: [birth year] (87 y.o. M) Treating RN: [name] Primary Care Nya Monds: [name] Other Clinician: Jeanine Wallace Referring Kaevion Sinclair: [name] Treating Oshua Mcconaha/Extender: [name] in Treatment: 8 Active Inactive Wound/Skin Impairment Nursing Diagnoses: Impaired tissue integrity Goals: Patient/caregiver will verbalize understanding of skin care regimen Date Initiated: 08/26/2020 Date Inactivated: 09/09/2020 Target Resolution Date: 08/26/2020 Goal Status: Met Ulcer/skin breakdown will have a volume reduction of 30% by week 4 Date Initiated: 08/26/2020 Date Inactivated: 09/23/2020 Target Resolution Date: 09/23/2020 Goal Status: Met Ulcer/skin breakdown will have a volume reduction of 50% by week 8 Date Initiated: 08/26/2020 Target Resolution Date: 10/24/2020 Goal Status: Active Ulcer/skin breakdown will have a volume reduction of 80% by week  12 Date Initiated: 08/26/2020 Target Resolution Date: 11/23/2020 Goal Status: Active Interventions: Assess patient/caregiver ability to obtain necessary supplies Assess patient/caregiver ability to perform ulcer/skin care regimen upon admission and as needed Assess ulceration(s) every visit Provide education on ulcer and skin care Treatment Activities: Skin care regimen initiated : 08/26/2020 Topical wound management initiated : 08/26/2020 Notes: Electronic Signature(s) Signed: 10/21/2020 5:15:58 PM By: [name], Jeremy Breeding RN Entered By: [name] on 10/21/2020 10:22:56 [place], [name] ([id number]) -------------------------------------------------------------------------------- Pain Assessment Details Patient Name: [name], [name]. Date of Service: 10/21/2020 9:45 AM Medical Record Number: [id number] Patient Account Number: [id number] Date of Birth/Sex: 1929-04-19 (87 y.o. M) Treating RN:  [name] Primary Care Enzo Treu: [name] Other Clinician: Jeanine Wallace Referring Fatou Dunnigan: [name] Treating Roshawn Lacina/Extender: [name] in Treatment: 8 Active Problems Location of Pain Severity and Description of Pain Patient Has Paino No Site Locations Rate the pain. Current Pain Level: 0 Pain Management and Medication Current Pain Management: Notes no pain at this time Electronic Signature(s) Signed: 10/21/2020 2:33:38 PM By: [name] Entered By: [name] on 10/21/2020 09:45:14 [place], [name] ([id number]) -------------------------------------------------------------------------------- Patient/Caregiver Education Details Patient Name: MEDHANSH, BRINKMEIER. Date of Service: 10/21/2020 9:45 AM Medical Record Number: [id number] Patient Account Number: [id number] Date of Birth/Gender: [birth year] (87 y.o. M) Treating RN: [name] Primary Care Physician: [name] Other Clinician: Jeanine Wallace Referring Physician: Ezequiel Wallace Treating Physician/Extender: [name] in Treatment: 8 Education Assessment Education Provided To: Patient Education Topics Provided Wound/Skin Impairment: Methods: Explain/Verbal Responses: State content correctly Electronic Signature(s) Signed: 10/21/2020 5:15:58 PM By: [name], Jeremy Breeding RN Entered By: [name] on 10/21/2020 10:31:53 [place]aus, [name] ([id number]) -------------------------------------------------------------------------------- Wound Assessment Details Patient Name: [name], [name]. Date of Service: 10/21/2020 9:45 AM Medical Record Number: [id number] Patient Account Number: [id number] Date of Birth/Sex: Aug 25, 1928 (87 y.o. M) Treating RN: [name] Primary Care Caterin Tabares: [name] Other Clinician: Jeanine Wallace Referring Willow Shidler: [name] Treating Aaisha Sliter/Extender: [name] in Treatment: 8 Wound Status Wound Number: 1 Primary Etiology:  Abscess Wound Location: Right, Proximal Gluteus Wound Status: Healed - Epithelialized Wounding Event: Gradually Appeared Date Acquired: 08/03/2020 Weeks Of Treatment: 8 Clustered Wound: No Photos Wound Measurements Length: (cm) 0 Width: (cm) 0 Depth: (cm) 0 Area: (cm) 0 Volume: (cm) 0 % Reduction in Area: 100% % Reduction in Volume: 100% Epithelialization: Large (67-100%) Tunneling: No Undermining: No Wound Description Classification: Full Thickness Without Exposed Support Structure Exudate Amount: None Present s Foul Odor After Cleansing: No Slough/Fibrino No Wound Bed Granulation Amount: None Present (0%) Exposed Structure Necrotic Amount: None Present (0%) Fascia Exposed: No Fat Layer (Subcutaneous Tissue) Exposed: No Tendon Exposed: No Muscle Exposed: No Joint Exposed: No Bone Exposed: No Electronic Signature(s) Signed: 10/21/2020 2:33:38 PM By: [name] Signed: 10/21/2020 5:15:58 PM By: [name], Jeremy Breeding RN Entered By: [name] on 10/21/2020 10:23:37 [name] ([id number]) -------------------------------------------------------------------------------- Wound Assessment Details Patient Name: [name], ZAHNER. Date of Service: 10/21/2020 9:45 AM Medical Record Number: [id number] Patient Account Number: [id number] Date of Birth/Sex: 09-15-1928 (87 y.o. M) Treating RN: [name] Primary Care Zora Glendenning: [name] Other Clinician: Jeanine Wallace Referring Kate Sweetman: [name] Treating Zafirah Vanzee/Extender: [name] in Treatment: 8 Wound Status Wound Number: 4 Primary Etiology: Abscess Wound Location: Left Gluteus Wound Status: Open Wounding Event: Gradually Appeared Date Acquired: 09/23/2020 Weeks Of Treatment: 2 Clustered Wound: No Photos Wound Measurements Length: (cm) 0.4 Width: (cm) 0.3 Depth: (cm) 0.8 Area: (cm) 0.094 Volume: (cm) 0.075 %  Reduction in Area: 65.8% % Reduction in Volume: -177.8% Wound  Description Classification: Full Thickness Without Exposed Support Structures Exudate Amount: Medium Exudate Type: Serous Exudate Color: amber Foul Odor After Cleansing: No Slough/Fibrino Yes Wound Bed Granulation Amount: Medium (34-66%) Exposed Structure Granulation Quality: Red Fascia Exposed: No Necrotic Amount: Medium (34-66%) Fat Layer (Subcutaneous Tissue) Exposed: Yes Tendon Exposed: No Muscle Exposed: No Joint Exposed: No Bone Exposed: No Treatment Notes Wound #4 (Gluteus) Wound Laterality: Left Cleanser Hibiclens, 16 (oz) Discharge Instruction: Put Hibiclenso on your skin head to toe and rub it in gently for five minutes with a washcloth. Turn the water back on and rinse very well with warm water. Do not use your regular soap after using and rinsing Hibiclenso. Pat yourself dry with a clean towel once a week Normal [place]. ([id number]) Discharge Instruction: Wash your hands with soap and water. Remove old dressing, discard into plastic bag and place into trash. Cleanse the wound with Normal Saline prior to applying a clean dressing using gauze sponges, not tissues or cotton balls. Do not scrub or use excessive force. Pat dry using gauze sponges, not tissue or cotton balls. Peri-Wound Care Nystatin Cream USP 30 (g) Discharge Instruction: Use Nystatin Cream as directed. Topical Primary Dressing Prisma 4.34 (in) Discharge Instruction: Moisten w/normal saline followed by plain packing strip . [place] Plain Packing Strips, Sterile 1x5 (in/yd) Discharge Instruction: Pack in as wick Secondary Dressing ABD Pad 5x9 (in/in) Discharge Instruction: Cover with ABD pad Secured With 84M [place] Surgical Tape, 2x2 (in/yd) Discharge Instruction: Secure dressing Compression Wrap Compression Stockings Add-Ons Electronic Signature(s) Signed: 10/21/2020 5:15:58 PM By: [name], Jeremy Breeding RN Entered By: [name] on 10/21/2020  10:24:01 [name] ([id number]) -------------------------------------------------------------------------------- [place] Details Patient Name: [name]. Date of Service: 10/21/2020 9:45 AM Medical Record Number: [id number] Patient Account Number: [id number] Date of Birth/Sex: September 26, 1928 (87 y.o. M) Treating RN: [name] Primary Care Provider: Ezequiel Wallace Other Clinician: Jeanine Wallace Referring Provider: Ezequiel Wallace Treating Provider/Extender: [name] in Treatment: 8 Vital Signs Time Taken: 09:44 Temperature (F): 97.7 Height (in): 70 Pulse (bpm): 69 Weight (lbs): 170 Respiratory Rate (breaths/min): 16 Body Mass Index (BMI): 24.4 Blood Pressure (mmHg): 105/61 Reference Range: 80 - 120 mg / dl Electronic Signature(s) Signed: 10/21/2020 2:33:38 PM By: [name] Entered ByDonnamarie Wallace on 10/21/2020 09:44:57

## 2020-10-21 NOTE — Progress Notes (Addendum)
KAILEN, HINKLE ([id number]) Visit Report for 10/21/2020 Chief Complaint Document Details Patient Name: [name], [name]. Date of Service: 10/21/2020 9:45 AM Medical Record Number: [id number] Patient Account Number: [id number] Date of Birth/Sex: [birth year] (87 y.o. M) Treating RN: Rogers Blocker Primary Care Provider: Mickey Farber Other Clinician: Lolita Cram Referring Provider: Mickey Farber Treating Provider/Extender: Rowan Blase in Treatment: 8 Information Obtained from: Patient Chief Complaint Bilateral gluteal abscesses Electronic Signature(s) Signed: 10/21/2020 10:21:41 AM By: Lenda Kelp PA-C Entered By: Lenda Kelp on 10/21/2020 10:21:41 LINWOOD, GULLIKSON ([id number]) -------------------------------------------------------------------------------- Debridement Details Patient Name: THEOREN, PALKA. Date of Service: 10/21/2020 9:45 AM Medical Record Number: [id number] Patient Account Number: [id number] Date of Birth/Sex: 1928/10/13 (87 y.o. M) Treating RN: Rogers Blocker Primary Care Provider: Mickey Farber Other Clinician: Lolita Cram Referring Provider: Mickey Farber Treating Provider/Extender: Rowan Blase in Treatment: 8 Debridement Performed for Wound #4 Left Gluteus Assessment: Performed By: Physician Nelida Meuse., PA-C Debridement Type: Debridement Level of Consciousness (Pre- Awake and Alert procedure): Pre-procedure Verification/Time Out Yes - 10:25 Taken: Start Time: 10:25 Total Area Debrided (L x W): 0.4 (cm) x 0.3 (cm) = 0.12 (cm) Tissue and other material Viable, Non-Viable, Slough, Subcutaneous, Slough debrided: Level: Skin/Subcutaneous Tissue Debridement Description: Excisional Instrument: Curette Bleeding: Minimum Hemostasis Achieved: Pressure Response to Treatment: Procedure was tolerated well Level of Consciousness (Post- Awake and Alert procedure): Post Debridement Measurements of Total Wound Length: (cm)  0.4 Width: (cm) 0.3 Depth: (cm) 0.9 Volume: (cm) 0.085 Character of Wound/Ulcer Post Debridement: Stable Post Procedure Diagnosis Same as Pre-procedure Electronic Signature(s) Signed: 10/21/2020 5:15:58 PM By: Lajean Manes RN Signed: 10/22/2020 1:09:49 PM By: Lenda Kelp PA-C Entered By: Phillis Haggis, Dondra Prader on 10/21/2020 10:25:59 Leaman, [name] ([id number]) -------------------------------------------------------------------------------- HPI Details Patient Name: OSBORNE, SERIO. Date of Service: 10/21/2020 9:45 AM Medical Record Number: [id number] Patient Account Number: [id number] Date of Birth/Sex: 1928/09/18 (87 y.o. M) Treating RN: Rogers Blocker Primary Care Provider: Mickey Farber Other Clinician: Lolita Cram Referring Provider: Mickey Farber Treating Provider/Extender: Rowan Blase in Treatment: 8 History of Present Illness HPI Description: 08/26/2020 upon evaluation today patient presents for initial inspection here in our clinic concerning issues he is been having with his gluteal region. He has several abscesses 2 on the right 1 on the left that have been present since at least December. With that being said triamcinolone and mupirocin has been used currently along with Desitin unfortunately this just does not seem to be making the progress that they would like to see. Obviously I think that the patient is not really showing any signs of infection right now which is good he was on doxycycline as prescribed by urgent care until he was seen in the ER in hospital where they apparently stopped this. Patient does have diabetes. He does also have a history of coronary artery disease. Subsequently his troponin was elevated in the hospital that is part of the reason why they kept him for analysis while he was there. Otherwise the patient seems to be doing well and I think he has a good chance of getting this to heal with appropriate treatment. 09/09/2020  upon evaluation today patient actually appears to be making good progress here with regard to his wounds. Fortunately there is no signs of active infection at this time. No fevers, chills, nausea, vomiting, or diarrhea. He has been tolerating the dressing changes without complication and his daughter-in-law is doing an excellent job taking care of this. 09/23/2020 upon evaluation today  patient is making good progress in regard to the wounds in the gluteal region. He has been tolerating the dressing changes without complication and very pleased that he seems to be doing so well. There does not appear to be any evidence of active infection which is great news and overall I think that the patient is making wonderful progress. 10/07/20 upon evaluation today patient appears to be doing excellent in regard to his right gluteal region. He has a healed wound on the distal gluteal region and proximally this is measuring much better. That is great news. Unfortunately he did have a new area open up on the left gluteal region this does appear to be an abscess and does not have to be cleaned out. He has been on doxycycline as prescribed by urgent care. 10/15/2020 upon evaluation today patient appears to be doing well with regard to his wounds in the gluteal region. I feel like he is actually making good progress here. Fortunately there does not appear any signs of active infection which is great news. No fevers, chills, nausea, vomiting, or diarrhea. 10/21/2020 upon evaluation today patient appears to be doing well with regard to his wounds. Is been tolerating the dressing changes without complication. The right side is healed the left side though not healed does appear to be doing well there is a little bit of debridement around the edges of the wound internally that do need to be performed. There is some slough here. Electronic Signature(s) Signed: 10/21/2020 10:33:07 AM By: Lenda KelpStone III, Arti Trang PA-C Entered By: Lenda KelpStone  III, Maddex Garlitz on 10/21/2020 10:33:07 Norberta KeensBRADFORD, Cain J. ([id number]) -------------------------------------------------------------------------------- Physical Exam Details Patient Name: Norberta KeensBRADFORD, Corran J. Date of Service: 10/21/2020 9:45 AM Medical Record Number: [id number] Patient Account Number: [id number] Date of Birth/Sex: 06/11/1929 (87 y.o. M) Treating RN: Rogers BlockerSanchez, Kenia Primary Care Provider: Mickey Farberhies, David Other Clinician: Lolita CramBurnette, Kyara Referring Provider: Mickey Farberhies, David Treating Provider/Extender: Rowan BlaseStone, Huong Luthi Weeks in Treatment: 8 Constitutional Well-nourished and well-hydrated in no acute distress. Respiratory normal breathing without difficulty. Psychiatric this patient is able to make decisions and demonstrates good insight into disease process. Alert and Oriented x 3. pleasant and cooperative. Notes Patient's wound bed actually showed signs of good granulation in the base of the wound there was some slough around the edges of the wound. We have been using the collagen into here and this seems to be doing a great job. With that being said I do not see any evidence that this is worsening in any way. Also we did initiate the nystatin cream last week which has made a dramatic improvement in his irritation around the sacral region. That was definitely fungal and not pressure. Subsequently I am very pleased with where that stands today as well. Hopefully he will be done with that by next week and will not need that any longer. Electronic Signature(s) Signed: 10/21/2020 10:33:49 AM By: Lenda KelpStone III, Rissie Sculley PA-C Entered By: Lenda KelpStone III, Lacharles Altschuler on 10/21/2020 10:33:49 Nater, Malena CatholicALBERT J. ([id number]) -------------------------------------------------------------------------------- Physician Orders Details Patient Name: Norberta KeensBRADFORD, Taner J. Date of Service: 10/21/2020 9:45 AM Medical Record Number: [id number] Patient Account Number: [id number] Date of Birth/Sex: 11/25/1928 (87 y.o. M) Treating RN:  Rogers BlockerSanchez, Kenia Primary Care Provider: Mickey Farberhies, David Other Clinician: Lolita CramBurnette, Kyara Referring Provider: Mickey Farberhies, David Treating Provider/Extender: Rowan BlaseStone, Sanaa Zilberman Weeks in Treatment: 8 Verbal / Phone Orders: No Diagnosis Coding ICD-10 Coding Code Description L02.31 Cutaneous abscess of buttock L98.412 Non-pressure chronic ulcer of buttock with fat layer exposed E11.622 Type 2 diabetes mellitus with other skin ulcer I25.10  Atherosclerotic heart disease of native coronary artery without angina pectoris I50.42 Chronic combined systolic (congestive) and diastolic (congestive) heart failure Follow-up Appointments o Return Appointment in 1 week. Off-Loading o Turn and reposition every 2 hours Medications-Please add to medication list. o Other: - Continue applying Mupirocin intranasal and under finger nails weekly for next 4 weeks Wound Treatment Wound #4 - Gluteus Wound Laterality: Left Cleanser: Hibiclens, 16 (oz) 1 x Per Day/30 Days Discharge Instructions: Put Hibiclenso on your skin head to toe and rub it in gently for five minutes with a washcloth. Turn the water back on and rinse very well with warm water. Do not use your regular soap after using and rinsing Hibiclenso. Pat yourself dry with a clean towel once a week Cleanser: Normal Saline 1 x Per Day/30 Days Discharge Instructions: Wash your hands with soap and water. Remove old dressing, discard into plastic bag and place into trash. Cleanse the wound with Normal Saline prior to applying a clean dressing using gauze sponges, not tissues or cotton balls. Do not scrub or use excessive force. Pat dry using gauze sponges, not tissue or cotton balls. Peri-Wound Care: Nystatin Cream USP 30 (g) 1 x Per Day/30 Days Discharge Instructions: Use Nystatin Cream as directed. Primary Dressing: Prisma 4.34 (in) (Generic) 1 x Per Day/30 Days Discharge Instructions: Moisten w/normal saline followed by plain packing strip . Primary Dressing: Curad  Plain Packing Strips, Sterile 1x5 (in/yd) 1 x Per Day/30 Days Discharge Instructions: Pack in as wick Secondary Dressing: ABD Pad 5x9 (in/in) (Generic) 1 x Per Day/30 Days Discharge Instructions: Cover with ABD pad Secured With: 49M Medipore H Soft Cloth Surgical Tape, 2x2 (in/yd) (Generic) 1 x Per Day/30 Days Discharge Instructions: Secure dressing Electronic Signature(s) Signed: 10/21/2020 5:15:58 PM By: Phillis Haggis, Dondra Prader RN Signed: 10/22/2020 1:09:49 PM By: Lenda Kelp PA-C Entered By: Phillis Haggis, Dondra Prader on 10/21/2020 10:30:47 Osinski, [name] ([id number]) -------------------------------------------------------------------------------- Problem List Details Patient Name: DAGEN, BEEVERS. Date of Service: 10/21/2020 9:45 AM Medical Record Number: [id number] Patient Account Number: [id number] Date of Birth/Sex: 1928/07/17 (87 y.o. M) Treating RN: Rogers Blocker Primary Care Provider: Mickey Farber Other Clinician: Lolita Cram Referring Provider: Mickey Farber Treating Provider/Extender: Rowan Blase in Treatment: 8 Active Problems ICD-10 Encounter Code Description Active Date MDM Diagnosis L02.31 Cutaneous abscess of buttock 08/26/2020 No Yes L98.412 Non-pressure chronic ulcer of buttock with fat layer exposed 08/26/2020 No Yes E11.622 Type 2 diabetes mellitus with other skin ulcer 08/26/2020 No Yes I25.10 Atherosclerotic heart disease of native coronary artery without angina 08/26/2020 No Yes pectoris I50.42 Chronic combined systolic (congestive) and diastolic (congestive) heart 08/26/2020 No Yes failure Inactive Problems Resolved Problems Electronic Signature(s) Signed: 10/21/2020 9:56:06 AM By: Lenda Kelp PA-C Entered By: Lenda Kelp on 10/21/2020 09:56:05 Opdahl, [name] ([id number]) -------------------------------------------------------------------------------- Progress Note Details Patient Name: Norberta Keens. Date of Service: 10/21/2020  9:45 AM Medical Record Number: [id number] Patient Account Number: [id number] Date of Birth/Sex: 02-12-1929 (87 y.o. M) Treating RN: Rogers Blocker Primary Care Provider: Mickey Farber Other Clinician: Lolita Cram Referring Provider: Mickey Farber Treating Provider/Extender: Rowan Blase in Treatment: 8 Subjective Chief Complaint Information obtained from Patient Bilateral gluteal abscesses History of Present Illness (HPI) 08/26/2020 upon evaluation today patient presents for initial inspection here in our clinic concerning issues he is been having with his gluteal region. He has several abscesses 2 on the right 1 on the left that have been present since at least December. With that being said triamcinolone and mupirocin  has been used currently along with Desitin unfortunately this just does not seem to be making the progress that they would like to see. Obviously I think that the patient is not really showing any signs of infection right now which is good he was on doxycycline as prescribed by urgent care until he was seen in the ER in hospital where they apparently stopped this. Patient does have diabetes. He does also have a history of coronary artery disease. Subsequently his troponin was elevated in the hospital that is part of the reason why they kept him for analysis while he was there. Otherwise the patient seems to be doing well and I think he has a good chance of getting this to heal with appropriate treatment. 09/09/2020 upon evaluation today patient actually appears to be making good progress here with regard to his wounds. Fortunately there is no signs of active infection at this time. No fevers, chills, nausea, vomiting, or diarrhea. He has been tolerating the dressing changes without complication and his daughter-in-law is doing an excellent job taking care of this. 09/23/2020 upon evaluation today patient is making good progress in regard to the wounds in the gluteal region.  He has been tolerating the dressing changes without complication and very pleased that he seems to be doing so well. There does not appear to be any evidence of active infection which is great news and overall I think that the patient is making wonderful progress. 10/07/20 upon evaluation today patient appears to be doing excellent in regard to his right gluteal region. He has a healed wound on the distal gluteal region and proximally this is measuring much better. That is great news. Unfortunately he did have a new area open up on the left gluteal region this does appear to be an abscess and does not have to be cleaned out. He has been on doxycycline as prescribed by urgent care. 10/15/2020 upon evaluation today patient appears to be doing well with regard to his wounds in the gluteal region. I feel like he is actually making good progress here. Fortunately there does not appear any signs of active infection which is great news. No fevers, chills, nausea, vomiting, or diarrhea. 10/21/2020 upon evaluation today patient appears to be doing well with regard to his wounds. Is been tolerating the dressing changes without complication. The right side is healed the left side though not healed does appear to be doing well there is a little bit of debridement around the edges of the wound internally that do need to be performed. There is some slough here. Objective Constitutional Well-nourished and well-hydrated in no acute distress. Vitals Time Taken: 9:44 AM, Height: 70 in, Weight: 170 lbs, BMI: 24.4, Temperature: 97.7 F, Pulse: 69 bpm, Respiratory Rate: 16 breaths/min, Blood Pressure: 105/61 mmHg. Respiratory normal breathing without difficulty. Psychiatric this patient is able to make decisions and demonstrates good insight into disease process. Alert and Oriented x 3. pleasant and cooperative. General Notes: Patient's wound bed actually showed signs of good granulation in the base of the wound there  was some slough around the edges of the wound. We have been using the collagen into here and this seems to be doing a great job. With that being said I do not see any evidence that this is worsening in any way. Also we did initiate the nystatin cream last week which has made a dramatic improvement in his irritation around the sacral region. That was definitely fungal and not pressure. Subsequently I am  very pleased with where that stands today as well. Hopefully he will be done with that by next week and will not need that any longer. KAIRYN, OLMEDA ([id number]) Integumentary (Hair, Skin) Wound #1 status is Healed - Epithelialized. Original cause of wound was Gradually Appeared. The date acquired was: 08/03/2020. The wound has been in treatment 8 weeks. The wound is located on the Right,Proximal Gluteus. The wound measures 0cm length x 0cm width x 0cm depth; 0cm^2 area and 0cm^3 volume. There is no tunneling or undermining noted. There is a none present amount of drainage noted. There is no granulation within the wound bed. There is no necrotic tissue within the wound bed. Wound #4 status is Open. Original cause of wound was Gradually Appeared. The date acquired was: 09/23/2020. The wound has been in treatment 2 weeks. The wound is located on the Left Gluteus. The wound measures 0.4cm length x 0.3cm width x 0.8cm depth; 0.094cm^2 area and 0.075cm^3 volume. There is Fat Layer (Subcutaneous Tissue) exposed. There is a medium amount of serous drainage noted. There is medium (34-66%) red granulation within the wound bed. There is a medium (34-66%) amount of necrotic tissue within the wound bed. 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 There was a Excisional Skin/Subcutaneous Tissue Debridement with a total area of 0.12 sq cm performed by Nelida Meuse., PA-C. With the following instrument(s): Curette to remove Viable and Non-Viable tissue/material. Material removed includes Subcutaneous Tissue and Slough and. A time out was conducted at 10:25, prior to the start of the procedure. A Minimum amount of bleeding was controlled with Pressure. The procedure was tolerated well. Post Debridement Measurements: 0.4cm length x 0.3cm width x 0.9cm depth; 0.085cm^3 volume. Character of Wound/Ulcer Post Debridement is stable. Post procedure Diagnosis Wound #4: Same as Pre-Procedure Plan Follow-up Appointments: Return Appointment in 1 week. Off-Loading: Turn and reposition every 2 hours Medications-Please add to medication list.: Other: - Continue applying Mupirocin intranasal and under finger nails weekly for next 4 weeks WOUND #4: - Gluteus Wound Laterality: Left Cleanser: Hibiclens, 16 (oz) 1 x Per Day/30 Days Discharge Instructions: Put Hibiclens on your skin head to toe and rub it in gently for five minutes with a washcloth. Turn the water back on and rinse very well with warm water. Do not use your regular soap after using and rinsing Hibiclens. Pat yourself dry with a clean towel once a week Cleanser: Normal Saline 1 x Per Day/30 Days Discharge Instructions: Wash your hands with soap and water. Remove old dressing, discard into plastic bag and place into trash. Cleanse the wound with Normal Saline prior to applying a clean dressing using gauze sponges, not tissues or cotton balls. Do not scrub or use excessive force. Pat dry using gauze sponges, not tissue or cotton balls. Peri-Wound Care: Nystatin Cream USP 30 (g) 1 x Per Day/30 Days Discharge Instructions: Use Nystatin Cream as directed. Primary  Dressing: Prisma 4.34 (in) (Generic) 1 x Per Day/30 Days Discharge Instructions: Moisten w/normal saline followed by plain packing strip . Primary Dressing: Curad Plain Packing Strips, Sterile 1x5 (in/yd) 1 x Per Day/30 Days Discharge Instructions: Pack in as wick Secondary Dressing: ABD Pad 5x9 (  in/in) (Generic) 1 x Per Day/30 Days Discharge Instructions: Cover with ABD pad Secured With: 72M Medipore H Soft Cloth Surgical Tape, 2x2 (in/yd) (Generic) 1 x Per Day/30 Days Discharge Instructions: Secure dressing ARYAAN, PERSICHETTI. ([id number]) 1. Would recommend that we going continue with the wound care measures as before and the patient is in agreement with the plan this includes the use of silver collagen dressing into the base of the wound and he is doing excellent with that. 2. I am also can recommend at this time that we have the patient go ahead and continue with the plain packing strip and behind to hold the collagen into the base of the wound and also make sure that there are no pockets of fluid that collected within. 3. I am also can recommend an ABD pad secured with tape to cover. Obviously once were done with the nystatin cream which should be next week we go back to the border foam dressing which did much better for him in the past without causing skin irritation. We will see patient back for reevaluation in 1 week here in the clinic. If anything worsens or changes patient will contact our office for additional recommendations. Electronic Signature(s) Signed: 10/21/2020 10:34:38 AM By: Lenda Kelp PA-C Entered By: Lenda Kelp on 10/21/2020 10:34:38 Deshotel, [name] ([id number]) -------------------------------------------------------------------------------- SuperBill Details Patient Name: ALYJAH, LOVINGOOD. Date of Service: 10/21/2020 Medical Record Number: [id number] Patient Account Number: [id number] Date of Birth/Sex: [birth year] (86 y.o. M) Treating RN: Rogers Blocker Primary Care Provider: Mickey Farber Other Clinician: Lolita Cram Referring Provider: Mickey Farber Treating Provider/Extender: Rowan Blase in Treatment: 8 Diagnosis Coding ICD-10 Codes Code Description L02.31 Cutaneous abscess of buttock L98.412 Non-pressure chronic ulcer of buttock with fat layer exposed E11.622 Type 2 diabetes mellitus with other skin ulcer I25.10 Atherosclerotic heart disease of native coronary artery without angina pectoris I50.42 Chronic combined systolic (congestive) and diastolic (congestive) heart failure Facility Procedures CPT4 Code: [id number] Description: 11042 - DEB SUBQ TISSUE 20 SQ CM/< Modifier: Quantity: 1 CPT4 Code: Description: ICD-10 Diagnosis Description L98.412 Non-pressure chronic ulcer of buttock with fat layer exposed Modifier: Quantity: Physician Procedures CPT4 Code: [id number] Description: 11042 - WC PHYS SUBQ TISS 20 SQ CM Modifier: Quantity: 1 CPT4 Code: Description: ICD-10 Diagnosis Description L98.412 Non-pressure chronic ulcer of buttock with fat layer exposed Modifier: Quantity: Electronic Signature(s) Signed: 10/21/2020 10:34:46 AM By: Lenda Kelp PA-C Entered By: Lenda Kelp on 10/21/2020 10:34:46

## 2020-10-22 ENCOUNTER — Ambulatory Visit: Payer: Medicare Other | Admitting: Physician Assistant

## 2020-10-24 ENCOUNTER — Inpatient Hospital Stay
Admission: EM | Admit: 2020-10-24 | Discharge: 2020-10-27 | DRG: 193 | Disposition: A | Discharge status: Home Health | Payer: Medicare Other | Attending: Internal Medicine | Admitting: Internal Medicine

## 2020-10-24 ENCOUNTER — Other Ambulatory Visit: Payer: Self-pay

## 2020-10-24 ENCOUNTER — Encounter: Payer: Self-pay | Admitting: Emergency Medicine

## 2020-10-24 ENCOUNTER — Ambulatory Visit
Admission: EM | Admit: 2020-10-24 | Discharge: 2020-10-24 | Disposition: A | Discharge status: Home / Self Care | Payer: Medicare Other | Source: Home / Self Care

## 2020-10-24 ENCOUNTER — Ambulatory Visit (INDEPENDENT_AMBULATORY_CARE_PROVIDER_SITE_OTHER): Payer: Medicare Other

## 2020-10-24 DIAGNOSIS — J1000 Influenza due to other identified influenza virus with unspecified type of pneumonia: Secondary | ICD-10-CM | POA: Diagnosis not present

## 2020-10-24 DIAGNOSIS — Z79899 Other long term (current) drug therapy: Secondary | ICD-10-CM

## 2020-10-24 DIAGNOSIS — R509 Fever, unspecified: Secondary | ICD-10-CM | POA: Insufficient documentation

## 2020-10-24 DIAGNOSIS — I480 Paroxysmal atrial fibrillation: Secondary | ICD-10-CM | POA: Diagnosis present

## 2020-10-24 DIAGNOSIS — N179 Acute kidney failure, unspecified: Secondary | ICD-10-CM | POA: Diagnosis present

## 2020-10-24 DIAGNOSIS — E119 Type 2 diabetes mellitus without complications: Secondary | ICD-10-CM

## 2020-10-24 DIAGNOSIS — I5022 Chronic systolic (congestive) heart failure: Secondary | ICD-10-CM | POA: Diagnosis present

## 2020-10-24 DIAGNOSIS — N1831 Chronic kidney disease, stage 3a: Secondary | ICD-10-CM | POA: Diagnosis present

## 2020-10-24 DIAGNOSIS — Z20822 Contact with and (suspected) exposure to covid-19: Secondary | ICD-10-CM | POA: Insufficient documentation

## 2020-10-24 DIAGNOSIS — E1142 Type 2 diabetes mellitus with diabetic polyneuropathy: Secondary | ICD-10-CM | POA: Diagnosis present

## 2020-10-24 DIAGNOSIS — R197 Diarrhea, unspecified: Secondary | ICD-10-CM | POA: Diagnosis present

## 2020-10-24 DIAGNOSIS — R0602 Shortness of breath: Secondary | ICD-10-CM

## 2020-10-24 DIAGNOSIS — J9601 Acute respiratory failure with hypoxia: Principal | ICD-10-CM | POA: Diagnosis present

## 2020-10-24 DIAGNOSIS — R059 Cough, unspecified: Secondary | ICD-10-CM

## 2020-10-24 DIAGNOSIS — Z7982 Long term (current) use of aspirin: Secondary | ICD-10-CM

## 2020-10-24 DIAGNOSIS — E782 Mixed hyperlipidemia: Secondary | ICD-10-CM | POA: Diagnosis present

## 2020-10-24 DIAGNOSIS — J101 Influenza due to other identified influenza virus with other respiratory manifestations: Secondary | ICD-10-CM | POA: Diagnosis present

## 2020-10-24 DIAGNOSIS — E869 Volume depletion, unspecified: Secondary | ICD-10-CM | POA: Diagnosis present

## 2020-10-24 DIAGNOSIS — Z95810 Presence of automatic (implantable) cardiac defibrillator: Secondary | ICD-10-CM | POA: Diagnosis not present

## 2020-10-24 DIAGNOSIS — N400 Enlarged prostate without lower urinary tract symptoms: Secondary | ICD-10-CM | POA: Diagnosis present

## 2020-10-24 DIAGNOSIS — J189 Pneumonia, unspecified organism: Secondary | ICD-10-CM | POA: Insufficient documentation

## 2020-10-24 DIAGNOSIS — R778 Other specified abnormalities of plasma proteins: Secondary | ICD-10-CM | POA: Diagnosis present

## 2020-10-24 DIAGNOSIS — I4891 Unspecified atrial fibrillation: Secondary | ICD-10-CM | POA: Diagnosis present

## 2020-10-24 DIAGNOSIS — L89320 Pressure ulcer of left buttock, unstageable: Secondary | ICD-10-CM | POA: Diagnosis present

## 2020-10-24 DIAGNOSIS — R55 Syncope and collapse: Secondary | ICD-10-CM | POA: Diagnosis present

## 2020-10-24 DIAGNOSIS — I2510 Atherosclerotic heart disease of native coronary artery without angina pectoris: Secondary | ICD-10-CM | POA: Diagnosis present

## 2020-10-24 DIAGNOSIS — Z7984 Long term (current) use of oral hypoglycemic drugs: Secondary | ICD-10-CM

## 2020-10-24 DIAGNOSIS — E1122 Type 2 diabetes mellitus with diabetic chronic kidney disease: Secondary | ICD-10-CM | POA: Diagnosis present

## 2020-10-24 DIAGNOSIS — N189 Chronic kidney disease, unspecified: Secondary | ICD-10-CM

## 2020-10-24 DIAGNOSIS — I130 Hypertensive heart and chronic kidney disease with heart failure and stage 1 through stage 4 chronic kidney disease, or unspecified chronic kidney disease: Secondary | ICD-10-CM | POA: Diagnosis present

## 2020-10-24 DIAGNOSIS — I252 Old myocardial infarction: Secondary | ICD-10-CM

## 2020-10-24 LAB — URINALYSIS, COMPLETE (UACMP) WITH MICROSCOPIC
Bilirubin Urine: NEGATIVE
Glucose, UA: NEGATIVE mg/dL
Hgb urine dipstick: NEGATIVE
Ketones, ur: NEGATIVE mg/dL
Leukocytes,Ua: NEGATIVE
Nitrite: NEGATIVE
Protein, ur: NEGATIVE mg/dL
Specific Gravity, Urine: 1.008 (ref 1.005–1.030)
Squamous Epithelial / HPF: NONE SEEN (ref 0–5)
pH: 5 (ref 5.0–8.0)

## 2020-10-24 LAB — COMPREHENSIVE METABOLIC PANEL
ALT: 13 U/L (ref 0–44)
AST: 32 U/L (ref 15–41)
Albumin: 3.5 g/dL (ref 3.5–5.0)
Alkaline Phosphatase: 52 U/L (ref 38–126)
Anion gap: 12 (ref 5–15)
BUN: 32 mg/dL — ABNORMAL HIGH (ref 8–23)
CO2: 24 mmol/L (ref 22–32)
Calcium: 8.8 mg/dL — ABNORMAL LOW (ref 8.9–10.3)
Chloride: 95 mmol/L — ABNORMAL LOW (ref 98–111)
Creatinine, Ser: 1.62 mg/dL — ABNORMAL HIGH (ref 0.61–1.24)
GFR, Estimated: 40 mL/min — ABNORMAL LOW (ref >60)
Glucose, Bld: 132 mg/dL — ABNORMAL HIGH (ref 70–99)
Potassium: 4.4 mmol/L (ref 3.5–5.1)
Sodium: 131 mmol/L — ABNORMAL LOW (ref 135–145)
Total Bilirubin: 0.7 mg/dL (ref 0.3–1.2)
Total Protein: 7.7 g/dL (ref 6.5–8.1)

## 2020-10-24 LAB — CBC WITH DIFFERENTIAL/PLATELET
Abs Immature Granulocytes: 0.05 10*3/uL (ref 0.00–0.07)
Basophils Absolute: 0.1 10*3/uL (ref 0.0–0.1)
Basophils Relative: 1 %
Eosinophils Absolute: 0.3 10*3/uL (ref 0.0–0.5)
Eosinophils Relative: 3 %
HCT: 40.5 % (ref 39.0–52.0)
Hemoglobin: 13.2 g/dL (ref 13.0–17.0)
Immature Granulocytes: 1 %
Lymphocytes Relative: 22 %
Lymphs Abs: 2.2 10*3/uL (ref 0.7–4.0)
MCH: 33.6 pg (ref 26.0–34.0)
MCHC: 32.6 g/dL (ref 30.0–36.0)
MCV: 103.1 fL — ABNORMAL HIGH (ref 80.0–100.0)
Monocytes Absolute: 1.9 10*3/uL — ABNORMAL HIGH (ref 0.1–1.0)
Monocytes Relative: 19 %
Neutro Abs: 5.6 10*3/uL (ref 1.7–7.7)
Neutrophils Relative %: 54 %
Platelets: 121 10*3/uL — ABNORMAL LOW (ref 150–400)
RBC: 3.93 MIL/uL — ABNORMAL LOW (ref 4.22–5.81)
RDW: 12.7 % (ref 11.5–15.5)
WBC: 10.2 10*3/uL (ref 4.0–10.5)
nRBC: 0 % (ref 0.0–0.2)

## 2020-10-24 LAB — TROPONIN I (HIGH SENSITIVITY)
Troponin I (High Sensitivity): 79 ng/L — ABNORMAL HIGH (ref <18)
Troponin I (High Sensitivity): 67 ng/L — ABNORMAL HIGH (ref <18)

## 2020-10-24 LAB — RESP PANEL BY RT-PCR (FLU A&B, COVID) ARPGX2
Influenza A by PCR: POSITIVE — AB
Influenza B by PCR: NEGATIVE
SARS Coronavirus 2 by RT PCR: NEGATIVE

## 2020-10-24 LAB — LACTIC ACID, PLASMA
Lactic Acid, Venous: 2.3 mmol/L (ref 0.5–1.9)
Lactic Acid, Venous: 1.4 mmol/L (ref 0.5–1.9)

## 2020-10-24 LAB — BRAIN NATRIURETIC PEPTIDE: B Natriuretic Peptide: 379.3 pg/mL — ABNORMAL HIGH (ref 0.0–100.0)

## 2020-10-24 LAB — PROCALCITONIN: Procalcitonin: <0.1 ng/mL

## 2020-10-24 MED ORDER — OSELTAMIVIR PHOSPHATE 75 MG PO CAPS
75.0000 mg | ORAL_CAPSULE | Freq: Two times a day (BID) | ORAL | Status: DC
  Administered 2020-10-25: 75 mg via ORAL
  Filled 2020-10-24 (×3): qty 1

## 2020-10-24 MED ORDER — BENZONATATE 100 MG PO CAPS
100.0000 mg | ORAL_CAPSULE | Freq: Three times a day (TID) | ORAL | Status: DC | PRN
  Administered 2020-10-25 – 2020-10-26 (×3): 100 mg via ORAL
  Filled 2020-10-24 (×3): qty 1

## 2020-10-24 MED ORDER — SODIUM CHLORIDE 0.9 % IV SOLN: INTRAVENOUS | Status: AC

## 2020-10-24 MED ORDER — SODIUM CHLORIDE 0.9 % IV SOLN
1.0000 g | INTRAVENOUS | Status: DC
  Administered 2020-10-25: 19:00:00 1 g via INTRAVENOUS
  Filled 2020-10-24: qty 1
  Filled 2020-10-24: qty 10

## 2020-10-24 MED ORDER — MELATONIN 5 MG PO TABS
2.5000 mg | ORAL_TABLET | Freq: Every day | ORAL | Status: DC
  Administered 2020-10-25 – 2020-10-26 (×3): 2.5 mg via ORAL
  Filled 2020-10-24: qty 1
  Filled 2020-10-24 (×3): qty 0.5

## 2020-10-24 MED ORDER — SODIUM CHLORIDE 0.9 % IV BOLUS
500.0000 mL | Freq: Once | INTRAVENOUS | Status: AC
  Administered 2020-10-24: 500 mL via INTRAVENOUS

## 2020-10-24 MED ORDER — SODIUM CHLORIDE 0.9 % IV BOLUS
1000.0000 mL | Freq: Once | INTRAVENOUS | Status: AC
  Administered 2020-10-24: 1000 mL via INTRAVENOUS

## 2020-10-24 MED ORDER — SODIUM CHLORIDE 0.9 % IV SOLN
1.0000 g | Freq: Once | INTRAVENOUS | Status: AC
  Administered 2020-10-24: 1 g via INTRAVENOUS
  Filled 2020-10-24: qty 10

## 2020-10-24 MED ORDER — ENOXAPARIN SODIUM 40 MG/0.4ML ~~LOC~~ SOLN
40.0000 mg | SUBCUTANEOUS | Status: DC
  Administered 2020-10-25 – 2020-10-26 (×3): 40 mg via SUBCUTANEOUS
  Filled 2020-10-24 (×3): qty 0.4

## 2020-10-24 MED ORDER — SODIUM CHLORIDE 0.9 % IV SOLN
500.0000 mg | INTRAVENOUS | Status: DC
  Administered 2020-10-25 – 2020-10-26 (×2): 500 mg via INTRAVENOUS
  Filled 2020-10-24 (×5): qty 500

## 2020-10-24 MED ORDER — ACETAMINOPHEN 325 MG PO TABS
650.0000 mg | ORAL_TABLET | Freq: Four times a day (QID) | ORAL | Status: DC | PRN
  Administered 2020-10-25 – 2020-10-26 (×3): 650 mg via ORAL
  Filled 2020-10-24 (×3): qty 2

## 2020-10-24 MED ORDER — DOXYCYCLINE HYCLATE 100 MG PO TABS
100.0000 mg | ORAL_TABLET | Freq: Once | ORAL | Status: AC
  Administered 2020-10-24: 100 mg via ORAL
  Filled 2020-10-24: qty 1

## 2020-10-24 NOTE — ED Provider Notes (Signed)
[facility] Emergency Department Provider Note  ____________________________________________   Event Date/Time   First MD Initiated Contact with Patient 10/24/20 1647     (approximate)  I have reviewed the triage vital signs and the nursing notes.   HISTORY  Chief Complaint Cough    HPI [name] is a 87 y.o. male with coronary disease, defibrillator, CHF, diabetes who comes in with concerns for cough.  Patient reports having fevers, cough, shortness of breath for the last 4 days.  T-max was 102.6.  Patient also had a syncopal episode where he was assisted to the floor by his daughter-in-law who is a Engineer, civil (consulting).  Did not hit his head.  Patient was at urgent care and was noted to be hypotensive with wheezing.  Oxygen level was initially in the 80s.  Chest x-ray look consistent with pneumonia therefore patient was sent here for admission.  Patient states that while he was walking he did not lose consciousness he just felt really weak and his legs gave out.  Again he did not lose consciousness or blackout.  Not hit his head.  Patient reports that the cough is intermittent, moderate, nothing makes it better, nothing makes it worse.  He reports some chest pain and abdominal pain just from the coughing.  Denies any other symptoms.          Past Medical History:  Diagnosis Date  . CAD (coronary artery disease)   . Cardiac defibrillator in place   . CHF (congestive heart failure) (HCC)   . Diabetes mellitus without complication (HCC)   . History of myocardial infarction   . Peripheral neuropathy   . Restless leg syndrome     Patient Active Problem List   Diagnosis Date Noted  . Elevated troponin 08/23/2020  . Diabetes mellitus without complication (HCC) 08/23/2020  . BPH (benign prostatic hyperplasia) 08/23/2020  . Postural dizziness with presyncope 08/23/2020  . AICD (automatic cardioverter/defibrillator) present 08/23/2020  . Carotid stenosis  08/23/2020  . Sacral decubitus ulcer 08/23/2020  . Near syncope 08/23/2020  . Chronic systolic CHF (congestive heart failure), NYHA class 3 (HCC) 07/20/2020  . Coronary artery disease involving native coronary artery of native heart 07/20/2020  . Hyperlipidemia, mixed 07/20/2020  . Atrial fibrillation (HCC) 07/06/2020    Past Surgical History:  Procedure Laterality Date  . APPENDECTOMY    . CARDIAC DEFIBRILLATOR PLACEMENT    . PARTIAL KNEE ARTHROPLASTY Left     Prior to Admission medications   Medication Sig Start Date End Date Taking? Authorizing Provider  acetaminophen (TYLENOL) 500 MG tablet Take 1 tablet (500 mg total) by mouth every 6 (six) hours as needed. 08/24/20   Lurene Shadow, MD  aspirin 81 MG EC tablet Take by mouth.    [provider]  brimonidine (ALPHAGAN P) 0.1 % SOLN Place 1 drop into the left eye 2 (two) times daily.    [provider]  dorzolamide (TRUSOPT) 2 % ophthalmic solution Place 1 drop into both eyes 2 (two) times daily.    [provider]  ENTRESTO 24-26 MG SMARTSIG:1 Tablet(s) By Mouth Every 12 Hours 09/09/20   [provider]  finasteride (PROSCAR) 5 MG tablet Take by mouth. 06/23/20 06/23/21  [provider]  furosemide (LASIX) 40 MG tablet Take 40 mg by mouth daily. 07/20/20   [provider]  gabapentin (NEURONTIN) 300 MG capsule Take 300 mg by mouth 2 (two) times daily. 07/06/20   [provider]  latanoprost (XALATAN) 0.005 %  ophthalmic solution Place 1 drop into both eyes at bedtime.    [provider]  metFORMIN (GLUCOPHAGE) 500 MG tablet Take 500 mg by mouth daily with breakfast.    [provider]  metoprolol succinate (TOPROL-XL) 25 MG 24 hr tablet Take 25 mg by mouth daily. 08/11/20   [provider]  Multiple Vitamin (MULTI-VITAMIN) tablet Take 1 tablet by mouth daily.    [provider]  tamsulosin (FLOMAX) 0.4 MG CAPS capsule Take by mouth.     [provider]    Allergies Patient has no known allergies.  No family history on file.  Social History Social History   Tobacco Use  . Smoking status: Never Smoker  . Smokeless tobacco: Never Used  Vaping Use  . Vaping Use: Never used  Substance Use Topics  . Alcohol use: Not Currently  . Drug use: Not Currently      Review of Systems Constitutional: Positive fevers Eyes: No visual changes. ENT: No sore throat.  Positive congestion Cardiovascular: Denies chest pain. Respiratory positive coughing, shortness of breath Gastrointestinal: No abdominal pain.  No nausea, no vomiting.  No diarrhea.  No constipation. Genitourinary: Negative for dysuria. Musculoskeletal: Negative for back pain. Skin: Negative for rash. Neurological: Negative for headaches, focal weakness or numbness. All other ROS negative ____________________________________________   PHYSICAL EXAM:  VITAL SIGNS: ED Triage Vitals  Enc Vitals Group     BP      Pulse      Resp      Temp      Temp src      SpO2      Weight      Height      Head Circumference      Peak Flow      Pain Score      Pain Loc      Pain Edu?      Excl. in GC?     Constitutional: Alert and oriented. Well appearing and in no acute distress. Eyes: Conjunctivae are normal. EOMI. Head: Atraumatic. Nose: No congestion/rhinnorhea. Mouth/Throat: Mucous membranes are moist.   Neck: No stridor. Trachea Midline. FROM Cardiovascular: Normal rate, regular rhythm. Grossly normal heart sounds.  Good peripheral circulation. Respiratory: Normal respiratory effort.  No retractions. Lungs CTAB.  Frequently coughing and when he does oxygen levels drop.  Patient was placed on 2 L of oxygen Gastrointestinal: Soft and nontender. No distention. No abdominal bruits.  Musculoskeletal: No lower extremity tenderness nor edema.  No joint effusions. Neurologic:  Normal speech and language. No gross focal neurologic deficits are  appreciated.  Skin:  Skin is warm, dry and intact. No rash noted. Psychiatric: Mood and affect are normal. Speech and behavior are normal. GU: Deferred   ____________________________________________   LABS (all labs ordered are listed, but only abnormal results are displayed)  Labs Reviewed  CBC WITH DIFFERENTIAL/PLATELET - Abnormal; Notable for the following components:      Result Value   RBC 3.93 (*)    MCV 103.1 (*)    Platelets 121 (*)    Monocytes Absolute 1.9 (*)    All other components within normal limits  COMPREHENSIVE METABOLIC PANEL - Abnormal; Notable for the following components:   Sodium 131 (*)    Chloride 95 (*)    Glucose, Bld 132 (*)    BUN 32 (*)    Creatinine, Ser 1.62 (*)    Calcium 8.8 (*)    GFR, Estimated 40 (*)    All other  components within normal limits  LACTIC ACID, PLASMA - Abnormal; Notable for the following components:   Lactic Acid, Venous 2.3 (*)    All other components within normal limits  TROPONIN I (HIGH SENSITIVITY) - Abnormal; Notable for the following components:   Troponin I (High Sensitivity) 79 (*)    All other components within normal limits  CULTURE, BLOOD (ROUTINE X 2)  CULTURE, BLOOD (ROUTINE X 2)  RESP PANEL BY RT-PCR (FLU A&B, COVID) ARPGX2  PROCALCITONIN  LACTIC ACID, PLASMA  BRAIN NATRIURETIC PEPTIDE  URINALYSIS, COMPLETE (UACMP) WITH MICROSCOPIC  PROCALCITONIN   ____________________________________________   ED ECG REPORT I, Concha Se, the attending physician, personally viewed and interpreted this ECG.  Atrial fibrillation rate of 68, no ST elevation, no T wave versions, right bundle branch block with occasional PVC ____________________________________________  RADIOLOGY Vela Prose, personally viewed and evaluated these images (plain radiographs) as part of my medical decision making, as well as reviewing the written report by the radiologist.  ED MD interpretation: Bilateral opacifications concerning  for pneumonia  Official radiology report(s): DG Chest 2 View  Result Date: 10/24/2020 CLINICAL DATA:  Shortness of breath and cough. EXAM: CHEST - 2 VIEW COMPARISON:  Chest radiograph dated 08/23/2020. FINDINGS: The heart size is borderline enlarged. Vascular calcifications are seen in the aortic arch. Mild patchy bilateral interstitial opacities are decreased compared to prior exam. There is no pleural effusion or pneumothorax. The visualized skeletal structures are unremarkable. A left subclavian approach cardiac device is noted. IMPRESSION: Mild bilateral patchy interstitial opacities, decreased compared to prior exam, likely represent pneumonia. Aortic Atherosclerosis (ICD10-I70.0). Electronically Signed   By: Romona Curls M.D.   On: 10/24/2020 15:45    ____________________________________________   PROCEDURES  Procedure(s) performed (including Critical Care):  .1-3 Lead EKG Interpretation Performed by: Concha Se, MD Authorized by: Concha Se, MD     Interpretation: abnormal     ECG rate:  60s   ECG rate assessment: normal     Rhythm: atrial fibrillation     Ectopy: PVCs     Conduction: normal   .Critical Care Performed by: Concha Se, MD Authorized by: Concha Se, MD   Critical care provider statement:    Critical care time (minutes):  35   Critical care was necessary to treat or prevent imminent or life-threatening deterioration of the following conditions:  Respiratory failure   Critical care was time spent personally by me on the following activities:  Discussions with consultants, evaluation of patient's response to treatment, examination of patient, ordering and performing treatments and interventions, ordering and review of laboratory studies, ordering and review of radiographic studies, pulse oximetry, re-evaluation of patient's condition, obtaining history from patient or surrogate and review of old  charts     ____________________________________________   INITIAL IMPRESSION / ASSESSMENT AND PLAN / ED COURSE  EARON RIVEST was evaluated in Emergency Department on 10/24/2020 for the symptoms described in the history of present illness. He was evaluated in the context of the global COVID-19 pandemic, which necessitated consideration that the patient might be at risk for infection with the SARS-CoV-2 virus that causes COVID-19. Institutional protocols and algorithms that pertain to the evaluation of patients at risk for COVID-19 are in a state of rapid change based on information released by regulatory bodies including the CDC and federal and state organizations. These policies and algorithms were followed during the patient's care in the ED.    Patient comes in from urgent  care for admission for pneumonia.  Chest x-ray confirmed this.  Patient does not meet SIRS criteria patient only has elevated respiratory rate.  Therefore sepsis alert was not called but blood cultures and lactate were ordered.  Lactate was slightly elevated so patient was given fluid.  At this time I have lower suspicion for PE or CHF or ACS given patient's HPI.  Will get COVID swab.  Labs show slightly elevated kidney function.  Patient is already getting fluids.  We will start patient on ceftriaxone and azithromycin.  Will discuss with hospital team for admission due to patient being on 2 L of oxygen       ____________________________________________   FINAL CLINICAL IMPRESSION(S) / ED DIAGNOSES   Final diagnoses:  Acute respiratory failure with hypoxia (HCC)  Community acquired pneumonia, unspecified laterality      MEDICATIONS GIVEN DURING THIS VISIT:  Medications  cefTRIAXone (ROCEPHIN) 1 g in sodium chloride 0.9 % 100 mL IVPB (has no administration in time range)  doxycycline (VIBRA-TABS) tablet 100 mg (has no administration in time range)  sodium chloride 0.9 % bolus 1,000 mL (has no  administration in time range)     ED Discharge Orders    None       Note:  This document was prepared using Dragon voice recognition software and may include unintentional dictation errors.   Concha Se, MD 10/24/20 [phone]

## 2020-10-24 NOTE — H&P (Addendum)
History and Physical    [name] EUM:[id number] DOB: 07-May-1929 DOA: 10/24/2020  PCP: Mickey Farber, MD  Patient coming from: Urgent care I have personally briefly reviewed patient's old medical records in [facility]  Chief Complaint: Hypertension and pneumonia  HPI: [name] is a 87 y.o. male with medical history significant for Chronic systolic HF s/p AICD, Atrial fibrillation not on anticoagulation, CAD, bilateral carotid stenosis, AAA, Type 2 DM and HLD who presents from urgent care for hypotension, and pneumonia.  Patient recently moved down from [place] in January and currently living with his son's family.  Last week his grandson came home with some cold-like symptoms.  Tested negative for COVID with a home test.  However than 3 days ago he began to develop symptoms of body ache, weakness and coughing.  Has decreased appetite.  Today he also began to have diarrhea.  Has upper abdominal pain that is worse with coughing.  He presented to urgent care today and while getting into the office he felt weak like his legs gave out and felt dizzy.  He normally ambulates with a walker.  He was noted to be hypotensive in the systolic of 80s.  Chest x-ray done at urgent care showed bilateral pneumonia and he was asked to present to the ED for further evaluation.  In the ED, he was afebrile, tachypneic and placed on 2 L via nasal cannula.  He was hypertensive with systolic of 150s.  No leukocytosis but lactate of 2.3.  Sodium of 131, creatinine of 1.62 from prior 1.22.  BG of 132.  Troponin mildly elevated at 79.  EKG shows atrial fibrillation with PVCs.  Review of Systems: Constitutional: No Weight Change, No Fever ENT/Mouth: No sore throat, + Rhinorrhea Eyes: No Eye Pain, No Vision Changes Cardiovascular: No Chest Pain, no SOB, No Edema, No Palpitations Respiratory: + Cough, + Sputum, No Wheezing, no Dyspnea  Gastrointestinal: No Nausea, No Vomiting, + Diarrhea, No  Constipation, No Pain Genitourinary: no Urinary Incontinence Musculoskeletal: No Arthralgias, No Myalgias Skin: No Skin Lesions, No Pruritus, Neuro: + Weakness, No Numbness,  No Loss of Consciousness, No Syncope Psych: No Anxiety/Panic, No Depression,+ decrease appetite Heme/Lymph: No Bruising, No Bleeding  Past Medical History:  Diagnosis Date  . CAD (coronary artery disease)   . Cardiac defibrillator in place   . CHF (congestive heart failure) (HCC)   . Diabetes mellitus without complication (HCC)   . History of myocardial infarction   . Peripheral neuropathy   . Restless leg syndrome     Past Surgical History:  Procedure Laterality Date  . APPENDECTOMY    . CARDIAC DEFIBRILLATOR PLACEMENT    . PARTIAL KNEE ARTHROPLASTY Left      reports that he has never smoked. He has never used smokeless tobacco. He reports previous alcohol use. He reports previous drug use. Social History  No Known Allergies    Prior to Admission medications   Medication Sig Start Date End Date Taking? Authorizing Provider  acetaminophen (TYLENOL) 500 MG tablet Take 1 tablet (500 mg total) by mouth every 6 (six) hours as needed. 08/24/20   Lurene Shadow, MD  aspirin 81 MG EC tablet Take by mouth.    [provider]  brimonidine (ALPHAGAN P) 0.1 % SOLN Place 1 drop into the left eye 2 (two) times daily.    [provider]  dorzolamide (TRUSOPT) 2 % ophthalmic solution Place 1 drop into both eyes 2 (two) times daily.    [provider]  ENTRESTO 24-26 MG SMARTSIG:1 Tablet(s) By Mouth Every 12 Hours 09/09/20   [provider]  finasteride (PROSCAR) 5 MG tablet Take by mouth. 06/23/20 06/23/21  [provider]  furosemide (LASIX) 40 MG tablet Take 40 mg by mouth daily. 07/20/20   [provider]  gabapentin (NEURONTIN) 300 MG capsule Take 300 mg by mouth 2 (two) times daily. 07/06/20   [provider]  latanoprost (XALATAN) 0.005 % ophthalmic  solution Place 1 drop into both eyes at bedtime.    [provider]  metFORMIN (GLUCOPHAGE) 500 MG tablet Take 500 mg by mouth daily with breakfast.    [provider]  metoprolol succinate (TOPROL-XL) 25 MG 24 hr tablet Take 25 mg by mouth daily. 08/11/20   [provider]  Multiple Vitamin (MULTI-VITAMIN) tablet Take 1 tablet by mouth daily.    [provider]  tamsulosin (FLOMAX) 0.4 MG CAPS capsule Take by mouth.    [provider]    Physical Exam: Vitals:   10/24/20 1659 10/24/20 1730 10/24/20 1900 10/24/20 1915  BP: 127/60 (!) 153/137 (!) 108/56   Pulse:  63 66 71  Resp:  (!) 24 (!) 25 20  Temp:      TempSrc:      SpO2:  98% 97% 97%    Constitutional: NAD, calm, comfortable, elderly gentleman appearing younger than his stated age laying at 15 degree incline in bed Vitals:   10/24/20 1659 10/24/20 1730 10/24/20 1900 10/24/20 1915  BP: 127/60 (!) 153/137 (!) 108/56   Pulse:  63 66 71  Resp:  (!) 24 (!) 25 20  Temp:      TempSrc:      SpO2:  98% 97% 97%   Eyes: PERRL, lids and conjunctivae normal ENMT: Mucous membranes are moist. Neck: normal, supple Respiratory: Bibasilar rhonchi without any wheezing or crackles.  Normal respiratory effort on 2 L via nasal cannula. No accessory muscle use.  Cardiovascular: Regular rate and rhythm, no murmurs / rubs / gallops. No extremity edema.  Abdomen: no tenderness, no masses palpated. Bowel sounds positive.  Musculoskeletal: no clubbing / cyanosis. No joint deformity upper and lower extremities. Good ROM, no contractures. Normal muscle tone.  Skin: no rashes, lesions, ulcers. No induration Neurologic: CN 2-12 grossly intact. Sensation intact,  Strength 5/5 in all 4.  Psychiatric: Normal judgment and insight. Alert and oriented x 3. Normal pleasant mood.     Labs on Admission: I have personally reviewed following labs and imaging studies  CBC: Recent Labs  Lab 10/24/20 1654  WBC 10.2   NEUTROABS 5.6  HGB 13.2  HCT 40.5  MCV 103.1*  PLT 121*   Basic Metabolic Panel: Recent Labs  Lab 10/24/20 1654  NA 131*  K 4.4  CL 95*  CO2 24  GLUCOSE 132*  BUN 32*  CREATININE 1.62*  CALCIUM 8.8*   GFR: Estimated Creatinine Clearance: 32.3 mL/min (A) (by C-G formula based on SCr of 1.62 mg/dL (H)). Liver Function Tests: Recent Labs  Lab 10/24/20 1654  AST 32  ALT 13  ALKPHOS 52  BILITOT 0.7  PROT 7.7  ALBUMIN 3.5   No results for input(s): LIPASE, AMYLASE in the last 168 hours. No results for input(s): AMMONIA in the last 168 hours. Coagulation Profile: No results for input(s): INR, PROTIME in the last 168 hours. Cardiac Enzymes: No results for input(s): CKTOTAL, CKMB, CKMBINDEX, TROPONINI in the last 168 hours. BNP (last 3 results) No results for input(s): PROBNP  in the last 8760 hours. HbA1C: No results for input(s): HGBA1C in the last 72 hours. CBG: No results for input(s): GLUCAP in the last 168 hours. Lipid Profile: No results for input(s): CHOL, HDL, LDLCALC, TRIG, CHOLHDL, LDLDIRECT in the last 72 hours. Thyroid Function Tests: No results for input(s): TSH, T4TOTAL, FREET4, T3FREE, THYROIDAB in the last 72 hours. Anemia Panel: No results for input(s): VITAMINB12, FOLATE, FERRITIN, TIBC, IRON, RETICCTPCT in the last 72 hours. Urine analysis:    Component Value Date/Time   COLORURINE YELLOW (A) 10/24/2020 1654   APPEARANCEUR CLEAR (A) 10/24/2020 1654   LABSPEC 1.008 10/24/2020 1654   PHURINE 5.0 10/24/2020 1654   GLUCOSEU NEGATIVE 10/24/2020 1654   HGBUR NEGATIVE 10/24/2020 1654   BILIRUBINUR NEGATIVE 10/24/2020 1654   KETONESUR NEGATIVE 10/24/2020 1654   PROTEINUR NEGATIVE 10/24/2020 1654   NITRITE NEGATIVE 10/24/2020 1654   LEUKOCYTESUR NEGATIVE 10/24/2020 1654    Radiological Exams on Admission: DG Chest 2 View  Result Date: 10/24/2020 CLINICAL DATA:  Shortness of breath and cough. EXAM: CHEST - 2 VIEW COMPARISON:  Chest radiograph  dated 08/23/2020. FINDINGS: The heart size is borderline enlarged. Vascular calcifications are seen in the aortic arch. Mild patchy bilateral interstitial opacities are decreased compared to prior exam. There is no pleural effusion or pneumothorax. The visualized skeletal structures are unremarkable. A left subclavian approach cardiac device is noted. IMPRESSION: Mild bilateral patchy interstitial opacities, decreased compared to prior exam, likely represent pneumonia. Aortic Atherosclerosis (ICD10-I70.0). Electronically Signed   By: Romona Curls M.D.   On: 10/24/2020 15:45      Assessment/Plan  Acute hypoxic respiratory failure secondary to pneumonia/Influenza A  On 2 L via .  Keep O2 >92% Continue IV Rocephin and azithromycin- pt later resulted flu positive. Start oseltamivir.  Can likely de-escalate pending blood cultures- Procal <0.10 Legionella pneumonia and strep pneumo urinary antigen test  AKI on CKD 3a Creatinine of 1.62 on admit from a prior of 1.22  Continuous IV 75 cc normal saline fluid Follow with repeat BMP.  Avoid nephrotoxic agent.  Hypotension/near syncope Likely from dehydration. Continue to monitor with IV fluid  Elevated troponin  Likely demand and possibly his baseline  will follow trend   Chronic systolic CHF s/p AICD EF of 40-45% in Feb 2022  Atrial fibrillation Not On anticoagulation  Type 2 diabetes No hyperglycemia HbA1C 7.6 2/22  DVT prophylaxis:.Lovenox Code Status: Limited/ DNI Family Communication: Plan discussed with patient at bedside  disposition Plan: Home with observation Consults called:  Admission status: Observation  Level of care: Med-Surg  Status is: Observation  The patient remains OBS appropriate and will d/c before 2 midnights.  Dispo: The patient is from: Home              Anticipated d/c is to: Home              Patient currently is not medically stable to d/c.   Difficult to place patient No         Anselm Jungling DO [facility]   If 7PM-7AM, please contact night-coverage [email or web address]   10/24/2020, 7:30 PM

## 2020-10-24 NOTE — ED Notes (Signed)
Lactic 2.3, MD aware

## 2020-10-24 NOTE — ED Notes (Signed)
Provided patient sandwich tray.

## 2020-10-24 NOTE — ED Triage Notes (Signed)
Reported via EMS from from [facility]. Dx with Pneumonia. Cough, fever x 4 days. Family members with similar symptoms. Covid test pending. 88% room air.

## 2020-10-24 NOTE — ED Notes (Signed)
Wheeled patient to restroom.

## 2020-10-24 NOTE — Discharge Instructions (Addendum)
Please go to the emergency department at [facility] to be evaluated and admitted for your low blood pressure, low oxygen saturation, and pneumonia.

## 2020-10-24 NOTE — ED Notes (Signed)
Patient is being discharged from the Urgent Care and sent to the [facility] Emergency Department via EMS . Per Becky Augusta, NP, patient is in need of higher level of care due to hypoxia and pneumonia. Patient is aware and verbalizes understanding of plan of care.  Vitals:   10/24/20 1517  BP: (!) 85/65  Pulse: 61  Resp: 20  Temp: 97.8 F (36.6 C)  SpO2: 92%

## 2020-10-24 NOTE — ED Provider Notes (Signed)
[facility]    CSN: [id number] Arrival date & time: 10/24/20  1421  Wt Readings from Last 3 Encounters:  10/24/20 166 lb (75.3 kg)  09/29/20 172 lb 6.4 oz (78.2 kg)  08/24/20 172 lb 6.4 oz (78.2 kg)   Temp Readings from Last 3 Encounters:  10/24/20 97.8 F (36.6 C) (Oral)  09/29/20 97.7 F (36.5 C) (Oral)  08/24/20 97.7 F (36.5 C) (Oral)   BP Readings from Last 3 Encounters:  10/24/20 (!) 85/65  09/29/20 92/66  08/24/20 112/71   Pulse Readings from Last 3 Encounters:  10/24/20 61  09/29/20 89  08/24/20 71       History   Chief Complaint Chief Complaint  Patient presents with  . Cough  . Fever  . Shortness of Breath    HPI [name] is a 87 y.o. male.   HPI   87 year old male here for evaluation of fever, cough, shortness of breath.  Patient is here with his wife who has similar symptoms and has been experiencing fever, cough, and shortness of breath for last 4 days.  Patient's T-max was 102.6.  He has had a runny nose for clear nasal discharge and a productive cough for yellow sputum.  Patient also had wheezing and he started having diarrhea today.  Patient is also having body aches.  Showed of syncope and was assisted to the floor by his daughter-in-law who is a Engineer, civil (consulting)nurse.  This happened during transition back to the room to be evaluated.  Patient did not hit his head and recovered quickly.  Past Medical History:  Diagnosis Date  . CAD (coronary artery disease)   . Cardiac defibrillator in place   . CHF (congestive heart failure) (HCC)   . Diabetes mellitus without complication (HCC)   . History of myocardial infarction   . Peripheral neuropathy   . Restless leg syndrome     Patient Active Problem List   Diagnosis Date Noted  . Elevated troponin 08/23/2020  . Diabetes mellitus without complication (HCC) 08/23/2020  . BPH (benign prostatic hyperplasia) 08/23/2020  . Postural dizziness with presyncope 08/23/2020  . AICD (automatic  cardioverter/defibrillator) present 08/23/2020  . Carotid stenosis 08/23/2020  . Sacral decubitus ulcer 08/23/2020  . Near syncope 08/23/2020  . Chronic systolic CHF (congestive heart failure), NYHA class 3 (HCC) 07/20/2020  . Coronary artery disease involving native coronary artery of native heart 07/20/2020  . Hyperlipidemia, mixed 07/20/2020  . Atrial fibrillation (HCC) 07/06/2020    Past Surgical History:  Procedure Laterality Date  . APPENDECTOMY    . CARDIAC DEFIBRILLATOR PLACEMENT    . PARTIAL KNEE ARTHROPLASTY Left        Home Medications    Prior to Admission medications   Medication Sig Start Date End Date Taking? Authorizing Provider  acetaminophen (TYLENOL) 500 MG tablet Take 1 tablet (500 mg total) by mouth every 6 (six) hours as needed. 08/24/20  Yes Lurene ShadowAyiku, Bernard, MD  aspirin 81 MG EC tablet Take by mouth.   Yes [provider]  brimonidine (ALPHAGAN P) 0.1 % SOLN Place 1 drop into the left eye 2 (two) times daily.   Yes [provider]  dorzolamide (TRUSOPT) 2 % ophthalmic solution Place 1 drop into both eyes 2 (two) times daily.   Yes [provider]  ENTRESTO 24-26 MG SMARTSIG:1 Tablet(s) By Mouth Every 12 Hours 09/09/20  Yes [provider]  finasteride (PROSCAR) 5 MG tablet Take by mouth. 06/23/20 06/23/21 Yes [provider]  furosemide (LASIX) 40 MG tablet Take 40 mg by mouth daily. 07/20/20  Yes [provider]  gabapentin (NEURONTIN) 300 MG capsule Take 300 mg by mouth 2 (two) times daily. 07/06/20  Yes [provider]  latanoprost (XALATAN) 0.005 % ophthalmic solution Place 1 drop into both eyes at bedtime.   Yes [provider]  metFORMIN (GLUCOPHAGE) 500 MG tablet Take 500 mg by mouth daily with breakfast.   Yes [provider]  metoprolol succinate (TOPROL-XL) 25 MG 24 hr tablet Take 25 mg by mouth daily. 08/11/20  Yes [provider]  Multiple Vitamin (MULTI-VITAMIN)  tablet Take 1 tablet by mouth daily.   Yes [provider]  tamsulosin (FLOMAX) 0.4 MG CAPS capsule Take by mouth.   Yes [provider]    Family History History reviewed. No pertinent family history.  Social History Social History   Tobacco Use  . Smoking status: Never Smoker  . Smokeless tobacco: Never Used  Vaping Use  . Vaping Use: Never used  Substance Use Topics  . Alcohol use: Not Currently  . Drug use: Not Currently     Allergies   Patient has no known allergies.   Review of Systems Review of Systems  Constitutional: Positive for fever. Negative for activity change and appetite change.  HENT: Positive for congestion and rhinorrhea.   Respiratory: Positive for cough, shortness of breath and wheezing.   Gastrointestinal: Positive for diarrhea. Negative for nausea and vomiting.  Musculoskeletal: Positive for arthralgias and myalgias.  Skin: Negative for rash.  Neurological: Positive for dizziness and syncope.  Hematological: Negative.   Psychiatric/Behavioral: Negative.      Physical Exam Triage Vital Signs ED Triage Vitals  Enc Vitals Group     BP 10/24/20 1517 (!) 85/65     Pulse Rate 10/24/20 1517 61     Resp 10/24/20 1517 20     Temp 10/24/20 1517 97.8 F (36.6 C)     Temp Source 10/24/20 1517 Oral     SpO2 10/24/20 1517 92 %     Weight 10/24/20 1512 166 lb (75.3 kg)     Height 10/24/20 1512 6' (1.829 m)     Head Circumference --      Peak Flow --      Pain Score 10/24/20 1511 3     Pain Loc --      Pain Edu? --      Excl. in GC? --    No data found.  Updated Vital Signs BP (!) 85/65 (BP Location: Left Arm)   Pulse 61   Temp 97.8 F (36.6 C) (Oral)   Resp 20   Ht 6' (1.829 m)   Wt 166 lb (75.3 kg)   SpO2 92%   BMI 22.51 kg/m   Visual Acuity Right Eye Distance:   Left Eye Distance:   Bilateral Distance:    Right Eye Near:   Left Eye Near:    Bilateral Near:     Physical Exam Vitals and nursing note reviewed.   Constitutional:      General: He is in acute distress.     Appearance: He is well-developed. He is ill-appearing.  HENT:     Head: Normocephalic and atraumatic.     Right Ear: Tympanic membrane, ear canal and external ear normal.     Left Ear: Tympanic membrane, ear canal and external ear normal.     Mouth/Throat:     Mouth: Mucous membranes are moist.     Pharynx:  Posterior oropharyngeal erythema present.  Cardiovascular:     Rate and Rhythm: Normal rate and regular rhythm.     Pulses: Normal pulses.     Heart sounds: Normal heart sounds. No murmur heard. No gallop.   Pulmonary:     Effort: Pulmonary effort is normal.     Breath sounds: Wheezing present.  Musculoskeletal:     Cervical back: Normal range of motion and neck supple.  Lymphadenopathy:     Cervical: No cervical adenopathy.  Skin:    General: Skin is warm and dry.     Findings: No erythema or rash.  Neurological:     General: No focal deficit present.     Mental Status: He is alert and oriented to person, place, and time.  Psychiatric:        Mood and Affect: Mood normal.        Behavior: Behavior normal.        Thought Content: Thought content normal.        Judgment: Judgment normal.      UC Treatments / Results  Labs (all labs ordered are listed, but only abnormal results are displayed) Labs Reviewed  SARS CORONAVIRUS 2 (TAT 6-24 HRS)    EKG   Radiology DG Chest 2 View  Result Date: 10/24/2020 CLINICAL DATA:  Shortness of breath and cough. EXAM: CHEST - 2 VIEW COMPARISON:  Chest radiograph dated 08/23/2020. FINDINGS: The heart size is borderline enlarged. Vascular calcifications are seen in the aortic arch. Mild patchy bilateral interstitial opacities are decreased compared to prior exam. There is no pleural effusion or pneumothorax. The visualized skeletal structures are unremarkable. A left subclavian approach cardiac device is noted. IMPRESSION: Mild bilateral patchy interstitial opacities,  decreased compared to prior exam, likely represent pneumonia. Aortic Atherosclerosis (ICD10-I70.0). Electronically Signed   By: Romona Curls M.D.   On: 10/24/2020 15:45    Procedures Procedures (including critical care time)  Medications Ordered in UC Medications - No data to display  Initial Impression / Assessment and Plan / UC Course  I have reviewed the triage vital signs and the nursing notes.  Pertinent labs & imaging results that were available during my care of the patient were reviewed by me and considered in my medical decision making (see chart for details).   Patient is an ill-appearing 87 year old gentleman here for evaluation of fever, cough, shortness of breath that is been going on for last 4 days.  Patient's wife has similar symptoms though patient's fever has been up to 102.6.  Patient has clear nasal discharge and a productive cough for yellow sputum.  Physical exam reveals bilateral pearly gray tympanic membranes with a normal light reflex and clear external auditory canals.  Nasal mucosa is erythematous and edematous with clear nasal discharge.  Posterior oropharynx is mildly erythematous with clear postnasal drip.  Patient's lips are bluish in color.  No cervical of adenopathy appreciated exam.  Patient's lung sounds are markedly decreased in all fields with scattered wheezes when he coughs.  Patient is hypotensive at triage with a blood pressure of 85/65 and hypoxic with an O2 sat of 92 on room air.  Initially he was in the high 80s.  COVID swab and chest x-ray collected at triage.  Tray read is mild bilateral interstitial opacities most likely consistent with pneumonia.  Given patient's hypotension, hypoxia, and pneumonia on chest x-ray will send patient to [facility] via EMS for evaluation and likely admission.  Report called to Marylene Land the charge nurse in  the ER at [facility].   Final Clinical Impressions(s) / UC Diagnoses   Final diagnoses:  Pneumonia of both lungs due to  infectious organism, unspecified part of lung  Shortness of breath  Acute respiratory failure with hypoxia [facility])     Discharge Instructions     Please go to the emergency department at [facility] to be evaluated and admitted for your low blood pressure, low oxygen saturation, and pneumonia.    ED Prescriptions    None     PDMP not reviewed this encounter.   Becky Augusta, NP 10/24/20 [phone]

## 2020-10-24 NOTE — ED Triage Notes (Signed)
Patient was walking with his walker when he started to get lightheaded and slid down to the floor with me and his daughter holding him.  Becky Augusta NP and I helped transfer patient from the floor to a wheelchair.  Patient stating that he feels better.  Patient did not his his head. Patient is here for cough, SOB and fever for the past 4 days.

## 2020-10-24 NOTE — ED Notes (Signed)
Dr Cyndia Bent notified of BP trending down.

## 2020-10-25 ENCOUNTER — Encounter: Payer: Self-pay | Admitting: Family Medicine

## 2020-10-25 DIAGNOSIS — J1000 Influenza due to other identified influenza virus with unspecified type of pneumonia: Secondary | ICD-10-CM | POA: Diagnosis present

## 2020-10-25 DIAGNOSIS — E1122 Type 2 diabetes mellitus with diabetic chronic kidney disease: Secondary | ICD-10-CM | POA: Diagnosis present

## 2020-10-25 DIAGNOSIS — J9601 Acute respiratory failure with hypoxia: Secondary | ICD-10-CM | POA: Diagnosis present

## 2020-10-25 DIAGNOSIS — N1831 Chronic kidney disease, stage 3a: Secondary | ICD-10-CM | POA: Diagnosis present

## 2020-10-25 DIAGNOSIS — Z79899 Other long term (current) drug therapy: Secondary | ICD-10-CM | POA: Diagnosis not present

## 2020-10-25 DIAGNOSIS — I5022 Chronic systolic (congestive) heart failure: Secondary | ICD-10-CM | POA: Diagnosis present

## 2020-10-25 DIAGNOSIS — J101 Influenza due to other identified influenza virus with other respiratory manifestations: Secondary | ICD-10-CM | POA: Diagnosis present

## 2020-10-25 DIAGNOSIS — L89320 Pressure ulcer of left buttock, unstageable: Secondary | ICD-10-CM | POA: Diagnosis present

## 2020-10-25 DIAGNOSIS — I130 Hypertensive heart and chronic kidney disease with heart failure and stage 1 through stage 4 chronic kidney disease, or unspecified chronic kidney disease: Secondary | ICD-10-CM | POA: Diagnosis present

## 2020-10-25 DIAGNOSIS — Z20822 Contact with and (suspected) exposure to covid-19: Secondary | ICD-10-CM | POA: Diagnosis present

## 2020-10-25 DIAGNOSIS — I4891 Unspecified atrial fibrillation: Secondary | ICD-10-CM | POA: Diagnosis not present

## 2020-10-25 DIAGNOSIS — Z7984 Long term (current) use of oral hypoglycemic drugs: Secondary | ICD-10-CM | POA: Diagnosis not present

## 2020-10-25 DIAGNOSIS — Z7982 Long term (current) use of aspirin: Secondary | ICD-10-CM | POA: Diagnosis not present

## 2020-10-25 DIAGNOSIS — R55 Syncope and collapse: Secondary | ICD-10-CM | POA: Diagnosis present

## 2020-10-25 DIAGNOSIS — I2510 Atherosclerotic heart disease of native coronary artery without angina pectoris: Secondary | ICD-10-CM | POA: Diagnosis present

## 2020-10-25 DIAGNOSIS — J189 Pneumonia, unspecified organism: Secondary | ICD-10-CM | POA: Diagnosis present

## 2020-10-25 DIAGNOSIS — R197 Diarrhea, unspecified: Secondary | ICD-10-CM | POA: Diagnosis present

## 2020-10-25 DIAGNOSIS — N400 Enlarged prostate without lower urinary tract symptoms: Secondary | ICD-10-CM | POA: Diagnosis present

## 2020-10-25 DIAGNOSIS — E782 Mixed hyperlipidemia: Secondary | ICD-10-CM | POA: Diagnosis present

## 2020-10-25 DIAGNOSIS — I252 Old myocardial infarction: Secondary | ICD-10-CM | POA: Diagnosis not present

## 2020-10-25 DIAGNOSIS — E869 Volume depletion, unspecified: Secondary | ICD-10-CM | POA: Diagnosis present

## 2020-10-25 DIAGNOSIS — N179 Acute kidney failure, unspecified: Secondary | ICD-10-CM | POA: Diagnosis present

## 2020-10-25 DIAGNOSIS — Z95810 Presence of automatic (implantable) cardiac defibrillator: Secondary | ICD-10-CM | POA: Diagnosis not present

## 2020-10-25 DIAGNOSIS — E1142 Type 2 diabetes mellitus with diabetic polyneuropathy: Secondary | ICD-10-CM | POA: Diagnosis present

## 2020-10-25 DIAGNOSIS — I480 Paroxysmal atrial fibrillation: Secondary | ICD-10-CM | POA: Diagnosis present

## 2020-10-25 LAB — PROCALCITONIN: Procalcitonin: <0.1 ng/mL

## 2020-10-25 LAB — CBC
HCT: 36.5 % — ABNORMAL LOW (ref 39.0–52.0)
Hemoglobin: 11.8 g/dL — ABNORMAL LOW (ref 13.0–17.0)
MCH: 33.7 pg (ref 26.0–34.0)
MCHC: 32.3 g/dL (ref 30.0–36.0)
MCV: 104.3 fL — ABNORMAL HIGH (ref 80.0–100.0)
Platelets: 126 10*3/uL — ABNORMAL LOW (ref 150–400)
RBC: 3.5 MIL/uL — ABNORMAL LOW (ref 4.22–5.81)
RDW: 12.7 % (ref 11.5–15.5)
WBC: 9.7 10*3/uL (ref 4.0–10.5)
nRBC: 0 % (ref 0.0–0.2)

## 2020-10-25 LAB — BASIC METABOLIC PANEL
Anion gap: 11 (ref 5–15)
BUN: 32 mg/dL — ABNORMAL HIGH (ref 8–23)
CO2: 21 mmol/L — ABNORMAL LOW (ref 22–32)
Calcium: 8.1 mg/dL — ABNORMAL LOW (ref 8.9–10.3)
Chloride: 100 mmol/L (ref 98–111)
Creatinine, Ser: 1.53 mg/dL — ABNORMAL HIGH (ref 0.61–1.24)
GFR, Estimated: 43 mL/min — ABNORMAL LOW (ref >60)
Glucose, Bld: 125 mg/dL — ABNORMAL HIGH (ref 70–99)
Potassium: 4 mmol/L (ref 3.5–5.1)
Sodium: 132 mmol/L — ABNORMAL LOW (ref 135–145)

## 2020-10-25 LAB — LACTIC ACID, PLASMA: Lactic Acid, Venous: 1 mmol/L (ref 0.5–1.9)

## 2020-10-25 LAB — SARS CORONAVIRUS 2 (TAT 6-24 HRS): SARS Coronavirus 2: NEGATIVE

## 2020-10-25 LAB — STREP PNEUMONIAE URINARY ANTIGEN: Strep Pneumo Urinary Antigen: NEGATIVE

## 2020-10-25 MED ORDER — SODIUM CHLORIDE 0.9 % IV BOLUS
250.0000 mL | Freq: Once | INTRAVENOUS | Status: AC
  Administered 2020-10-25: 250 mL via INTRAVENOUS

## 2020-10-25 MED ORDER — FINASTERIDE 5 MG PO TABS
5.0000 mg | ORAL_TABLET | Freq: Every day | ORAL | Status: DC
  Administered 2020-10-25 – 2020-10-27 (×3): 5 mg via ORAL
  Filled 2020-10-25: qty 1

## 2020-10-25 MED ORDER — ASPIRIN EC 81 MG PO TBEC
81.0000 mg | DELAYED_RELEASE_TABLET | Freq: Every day | ORAL | Status: DC
  Administered 2020-10-25 – 2020-10-27 (×3): 81 mg via ORAL
  Filled 2020-10-25 (×3): qty 1

## 2020-10-25 MED ORDER — MIDODRINE HCL 5 MG PO TABS
5.0000 mg | ORAL_TABLET | Freq: Three times a day (TID) | ORAL | Status: DC
  Administered 2020-10-25 – 2020-10-27 (×7): 5 mg via ORAL
  Filled 2020-10-25 (×8): qty 1

## 2020-10-25 MED ORDER — FINASTERIDE 5 MG PO TABS
5.0000 mg | ORAL_TABLET | Freq: Every day | ORAL | Status: DC
  Filled 2020-10-25 (×2): qty 1

## 2020-10-25 MED ORDER — BRIMONIDINE TARTRATE 0.15 % OP SOLN
1.0000 [drp] | Freq: Two times a day (BID) | OPHTHALMIC | Status: DC
  Administered 2020-10-25 – 2020-10-27 (×4): 1 [drp] via OPHTHALMIC
  Filled 2020-10-25 (×3): qty 5

## 2020-10-25 MED ORDER — OSELTAMIVIR PHOSPHATE 30 MG PO CAPS
30.0000 mg | ORAL_CAPSULE | Freq: Two times a day (BID) | ORAL | Status: DC
  Administered 2020-10-25 – 2020-10-27 (×4): 30 mg via ORAL
  Filled 2020-10-25 (×7): qty 1

## 2020-10-25 MED ORDER — TAMSULOSIN HCL 0.4 MG PO CAPS
0.4000 mg | ORAL_CAPSULE | Freq: Every day | ORAL | Status: DC
  Administered 2020-10-25 – 2020-10-27 (×3): 0.4 mg via ORAL
  Filled 2020-10-25 (×2): qty 1

## 2020-10-25 MED ORDER — TAMSULOSIN HCL 0.4 MG PO CAPS: 0.4000 mg | ORAL_CAPSULE | Freq: Every day | ORAL | Status: DC

## 2020-10-25 MED ORDER — ORAL CARE MOUTH RINSE
15.0000 mL | Freq: Two times a day (BID) | OROMUCOSAL | Status: DC
  Administered 2020-10-25 – 2020-10-27 (×5): 15 mL via OROMUCOSAL

## 2020-10-25 MED ORDER — MIDODRINE HCL 5 MG PO TABS
5.0000 mg | ORAL_TABLET | ORAL | Status: AC
  Administered 2020-10-25: 5 mg via ORAL
  Filled 2020-10-25: qty 1

## 2020-10-25 NOTE — ED Notes (Signed)
Patient is resting comfortably. Pt given breakfast per requests. Fall precautions in place. Call light within reach.

## 2020-10-25 NOTE — ED Notes (Signed)
Order received from dr. Para March for iv ns bolus for hypotension.

## 2020-10-25 NOTE — Consult Note (Signed)
PHARMACY NOTE:  ANTIMICROBIAL RENAL DOSAGE ADJUSTMENT  Current antimicrobial regimen includes a mismatch between antimicrobial dosage and estimated renal function.  As per policy approved by the Pharmacy & Therapeutics and Medical Executive Committees, the antimicrobial dosage will be adjusted accordingly.  Current antimicrobial dosage:  tamiflu 75 mg BID  Indication: influ A  Renal Function:  Estimated Creatinine Clearance: 34.2 mL/min (A) (by C-G formula based on SCr of 1.53 mg/dL (H)). []      On intermittent HD, scheduled: []      On CRRT    Antimicrobial dosage has been changed to:  30 mg BID.   Additional comments:   Thank you for allowing pharmacy to be a part of this patient's care.  , [facility] 10/25/2020 10:13 AM

## 2020-10-25 NOTE — Consult Note (Signed)
WOC Nurse Consult Note: Reason for Consult: Patient with chronic wounds of the buttocks, abscesses.  Not pressure wounds.  Followed by the outpatient wound care center and was last seen on 10/21/20 by Allen Derry III, PA-C. Silver collagen powder has been used in the wound bed and a wick dressing to ensure no pocketing occurs.  We do not use the silver collagen dressing int he inpatient setting, so we will use a silver hydrofiber to wick instead, changing daily. Patient to resume previous POC upon discharge. Wound type: Infectious Pressure Injury POA: N/A Measurement: Right has recently reepithelialized and is reddened, but blanchable. Left is with nonviable tissue and depth. Measurements as of 10/21/20 are 0.4cm x 0.3cm x 0.9cm Wound bed: Some nonviable tissue exists even post debridement Drainage (amount, consistency, odor) Moderate light yellow  Periwound: intact, mild erythema Dressing procedure/placement/frequency: the left wound is to be cleansed with soap and water, rinsed and patted dry.  The packing is with a strip of silver hydrofiber dressing (Aquacel [employer] # [id number]). The same dressing is to be used as a topper dressing and topped with gauze, covered with silicone foam and changed daily. The right recently healed wound is to be covered with a silicone foam dressing.  A mattress replacement with low air loss feature is ordered as are bilateral Prevalon pressure redistribution heel boots. A prophylactic foam dressing is to be used for Pressure Injury prophylaxis.  Patient to follow with outpatient wound care center post discharge as directed.   WOC nursing team will not follow, but will remain available to this patient, the nursing and medical teams.  Please re-consult if needed. Thanks, Ladona Mow, MSN, RN, GNP, [place]  Pager# [phone]

## 2020-10-25 NOTE — ED Notes (Signed)
Spoke with brenda morrison, np regarding blood pressure, np to place midarone order.

## 2020-10-25 NOTE — ED Notes (Signed)
Report to addison, rn.  

## 2020-10-25 NOTE — ED Notes (Signed)
Waiting on midodrine from pharmacy

## 2020-10-25 NOTE — ED Notes (Signed)
Informed RN bed assigned 1220

## 2020-10-25 NOTE — Progress Notes (Addendum)
PROGRESS NOTE    [name]  NGE:[id number] DOB: 01/28/1929 DOA: 10/24/2020 PCP: Mickey Farber, MD  Chief Complaint  Patient presents with  . Cough    Brief Narrative:     HPI: [name] is a 87 y.o. male with medical history significant for Chronic systolic HF s/p AICD, Atrial fibrillation not on anticoagulation, CAD, bilateral carotid stenosis, AAA, Type 2 DM and HLD who presents from urgent care for hypotension, and pneumonia.  Patient recently moved down from [place] in January and currently living with his son's family.  Last week his grandson came home with some cold-like symptoms.  Tested negative for COVID with a home test.  However than 3 days ago he began to develop symptoms of body ache, weakness and coughing.  Has decreased appetite.  Today he also began to have diarrhea.  Has upper abdominal pain that is worse with coughing.  He presented to urgent care today and while getting into the office he felt weak like his legs gave out and felt dizzy.  He normally ambulates with a walker.  He was noted to be hypotensive in the systolic of 80s.  Chest x-ray done at urgent care showed bilateral pneumonia and he was asked to present to the ED for further evaluation.  In the ED, he was afebrile, tachypneic and placed on 2 L via nasal cannula.  He was hypertensive with systolic of 150s.  No leukocytosis but lactate of 2.3.  Sodium of 131, creatinine of 1.62 from prior 1.22.  BG of 132.  Troponin mildly elevated at 79.  EKG shows atrial fibrillation with PVCs.  Assessment & Plan:   Principal Problem:   Acute respiratory failure with hypoxia (HCC) Active Problems:   Elevated troponin   Chronic systolic CHF (congestive heart failure), NYHA class 3 (HCC)   Atrial fibrillation (HCC)   Diabetes mellitus without complication (HCC)   AICD (automatic cardioverter/defibrillator) present   Community acquired pneumonia   Acute-on-chronic kidney injury (HCC)   Influenza  A  Acute hypoxic respiratory failure secondary to pneumonia/Influenza A  -Remains on 2 L nasal cannula this morning -Chest x-ray significant for bilateral pneumonia(he does have some underlining chronic changes), but cannot rule out superimposed bacterial infection on top of his pneumonia, will continue with the Rocephin and azithromycin for next 24 to 48 hours specially his blood pressure is soft most of the day. -Continue with Tamiflu -He was encouraged to use incentive spirometry and flutter valve Legionella pneumonia and strep pneumo urinary antigen test  AKI on CKD 3a Creatinine of 1.62 on admit from a prior of 1.22  Continuous IV 75 cc normal saline fluid Follow with repeat BMP.  Avoid nephrotoxic agent.  Hypotension/near syncope Likely from dehydration. Continue to monitor with IV fluid Blood pressure is low during the day, so have started him on midodrine -Resume antihypertensive regimen when he is more able  Elevated troponin  Likely demand and possibly his baseline  Trending down which is reassuring  Atrial fibrillation Not On anticoagulation  Type 2 diabetes No hyperglycemia HbA1C 7.6 2/22   Chronic systolic CHF (congestive heart failure), NYHA class 3 (HCC)   AICD (automatic cardioverter/defibrillator) present - Initially dry, appropriately hydrated, hold Entresto and beta-blockers given soft blood pressure and hypotension.   Coronary artery disease involving native coronary artery of native heart   Hyperlipidemia, mixed -Continue aspirin and statin   BPH (benign prostatic hyperplasia) -Continue prostate meds   Sacral decubitus ulcer -Wound care evaluation  DVT prophylaxis:  Lovenox Code Status: Partial Family Communication: left Voicemail for son Disposition:   Status is: INPATIENT  The patient will require care spanning > 2 midnights and should be moved to inpatient because: IV treatments appropriate due to intensity of illness or inability  to take PO  Dispo: The patient is from: Home              Anticipated d/c is to: Home              Patient currently is not medically stable to d/c.  Blood pressure remains labile off his medications, on midodrine,   Difficult to place patient No       Consultants:   NONE  Subjective:  Orts his dyspnea has improved, reports he is feeling better overall.  Objective: Vitals:   10/25/20 1047 10/25/20 1149 10/25/20 1244 10/25/20 1423  BP: (!) 92/59 (!) 105/58 (!) 114/95 (!) 142/105  Pulse: 63  64 (!) 57  Resp:   18 (!) 22  Temp:    97.7 F (36.5 C)  TempSrc:      SpO2: 97%  97% 100%  Weight:      Height:        Intake/Output Summary (Last 24 hours) at 10/25/2020 1515 Last data filed at 10/25/2020 1511 Gross per 24 hour  Intake 1153.69 ml  Output 700 ml  Net 453.69 ml   Filed Weights   10/24/20 2149  Weight: 75.3 kg    Examination:  General exam: Appears calm and comfortable  Respiratory system: Clear to auscultation. Respiratory effort normal, SCATTERED RALES Cardiovascular system: S1 & S2 heard, RRR. No JVD, murmurs, rubs, gallops or clicks. No pedal edema. Gastrointestinal system: Abdomen is nondistended, soft and nontender. No organomegaly or masses felt. Normal bowel sounds heard. Central nervous system: Alert and oriented. No focal neurological deficits. Extremities: Symmetric 5 x 5 power. Skin: No rashes, lesions or ulcers Psychiatry: Judgement and insight appear normal. Mood & affect appropriate.     Data Reviewed: I have personally reviewed following labs and imaging studies  CBC: Recent Labs  Lab 10/24/20 1654 10/25/20 0402  WBC 10.2 9.7  NEUTROABS 5.6  --   HGB 13.2 11.8*  HCT 40.5 36.5*  MCV 103.1* 104.3*  PLT 121* 126*    Basic Metabolic Panel: Recent Labs  Lab 10/24/20 1654 10/25/20 0402  NA 131* 132*  K 4.4 4.0  CL 95* 100  CO2 24 21*  GLUCOSE 132* 125*  BUN 32* 32*  CREATININE 1.62* 1.53*  CALCIUM 8.8* 8.1*     GFR: Estimated Creatinine Clearance: 34.2 mL/min (A) (by C-G formula based on SCr of 1.53 mg/dL (H)).  Liver Function Tests: Recent Labs  Lab 10/24/20 1654  AST 32  ALT 13  ALKPHOS 52  BILITOT 0.7  PROT 7.7  ALBUMIN 3.5    CBG: No results for input(s): GLUCAP in the last 168 hours.   Recent Results (from the past 240 hour(s))  SARS CORONAVIRUS 2 (TAT 6-24 HRS) Nasopharyngeal Nasopharyngeal Swab     Status: None   Collection Time: 10/24/20  3:19 PM   Specimen: Nasopharyngeal Swab  Result Value Ref Range Status   SARS Coronavirus 2 NEGATIVE NEGATIVE Final    Comment: (NOTE) SARS-CoV-2 target nucleic acids are NOT DETECTED.  The SARS-CoV-2 RNA is generally detectable in upper and lower respiratory specimens during the acute phase of infection. Negative results do not preclude SARS-CoV-2 infection, do not rule out co-infections with other pathogens, and should not be used  as the sole basis for treatment or other patient management decisions. Negative results must be combined with clinical observations, patient history, and epidemiological information. The expected result is Negative.  Fact Sheet for Patients: HairSlick.nohttps://www.fda.gov/media/138098/download  Fact Sheet for Healthcare Providers: quierodirigir.comhttps://www.fda.gov/media/138095/download  This test is not yet approved or cleared by the [place] FDA and  has been authorized for detection and/or diagnosis of SARS-CoV-2 by FDA under an Emergency Use Authorization (EUA). This EUA will remain  in effect (meaning this test can be used) for the duration of the COVID-19 declaration under Se ction 564(b)(1) of the Act, 21 U.S.C. section 360bbb-3(b)(1), unless the authorization is terminated or revoked sooner.  Performed at [facility], [address] [place], [place] [address]   Resp Panel by RT-PCR (Flu A&B, Covid) Nasopharyngeal Swab     Status: Abnormal   Collection Time: 10/24/20  4:54 PM   Specimen:  Nasopharyngeal Swab; Nasopharyngeal(NP) swabs in vial transport medium  Result Value Ref Range Status   SARS Coronavirus 2 by RT PCR NEGATIVE NEGATIVE Final    Comment: (NOTE) SARS-CoV-2 target nucleic acids are NOT DETECTED.  The SARS-CoV-2 RNA is generally detectable in upper respiratory specimens during the acute phase of infection. The lowest concentration of SARS-CoV-2 viral copies this assay can detect is 138 copies/mL. A negative result does not preclude SARS-Cov-2 infection and should not be used as the sole basis for treatment or other patient management decisions. A negative result may occur with  improper specimen collection/handling, submission of specimen other than nasopharyngeal swab, presence of viral mutation(s) within the areas targeted by this assay, and inadequate number of viral copies(<138 copies/mL). A negative result must be combined with clinical observations, patient history, and epidemiological information. The expected result is Negative.  Fact Sheet for Patients:  BloggerCourse.comhttps://www.fda.gov/media/152166/download  Fact Sheet for Healthcare Providers:  SeriousBroker.ithttps://www.fda.gov/media/152162/download  This test is no t yet approved or cleared by the [place] FDA and  has been authorized for detection and/or diagnosis of SARS-CoV-2 by FDA under an Emergency Use Authorization (EUA). This EUA will remain  in effect (meaning this test can be used) for the duration of the COVID-19 declaration under Section 564(b)(1) of the Act, 21 U.S.C.section 360bbb-3(b)(1), unless the authorization is terminated  or revoked sooner.       Influenza A by PCR POSITIVE (A) NEGATIVE Final   Influenza B by PCR NEGATIVE NEGATIVE Final    Comment: (NOTE) The Xpert Xpress SARS-CoV-2/FLU/RSV plus assay is intended as an aid in the diagnosis of influenza from Nasopharyngeal swab specimens and should not be used as a sole basis for treatment. Nasal washings and aspirates are unacceptable  for Xpert Xpress SARS-CoV-2/FLU/RSV testing.  Fact Sheet for Patients: BloggerCourse.comhttps://www.fda.gov/media/152166/download  Fact Sheet for Healthcare Providers: SeriousBroker.ithttps://www.fda.gov/media/152162/download  This test is not yet approved or cleared by the [place] FDA and has been authorized for detection and/or diagnosis of SARS-CoV-2 by FDA under an Emergency Use Authorization (EUA). This EUA will remain in effect (meaning this test can be used) for the duration of the COVID-19 declaration under Section 564(b)(1) of the Act, 21 U.S.C. section 360bbb-3(b)(1), unless the authorization is terminated or revoked.  Performed at [facility], [address] [place], [place] [address]   Blood culture (routine x 2)     Status: None (Preliminary result)   Collection Time: 10/24/20  7:14 PM   Specimen: BLOOD RIGHT HAND  Result Value Ref Range Status   Specimen Description BLOOD RIGHT HAND  Final  Special Requests   Final    BOTTLES DRAWN AEROBIC AND ANAEROBIC Blood Culture adequate volume   Culture   Final    NO GROWTH < 12 HOURS Performed at [facility], [address] [place], [place] [address]    Report Status PENDING  Incomplete  Blood culture (routine x 2)     Status: None (Preliminary result)   Collection Time: 10/24/20  7:33 PM   Specimen: BLOOD RIGHT FOREARM  Result Value Ref Range Status   Specimen Description BLOOD RIGHT FOREARM  Final   Special Requests   Final    BOTTLES DRAWN AEROBIC AND ANAEROBIC Blood Culture adequate volume   Culture   Final    NO GROWTH < 12 HOURS Performed at [facility], [address] [place], [place] [address]    Report Status PENDING  Incomplete         Radiology Studies: DG Chest 2 View  Result Date: 10/24/2020 CLINICAL DATA:  Shortness of breath and cough. EXAM: CHEST - 2 VIEW COMPARISON:  Chest radiograph dated 08/23/2020. FINDINGS: The heart size is borderline enlarged. Vascular calcifications are seen  in the aortic arch. Mild patchy bilateral interstitial opacities are decreased compared to prior exam. There is no pleural effusion or pneumothorax. The visualized skeletal structures are unremarkable. A left subclavian approach cardiac device is noted. IMPRESSION: Mild bilateral patchy interstitial opacities, decreased compared to prior exam, likely represent pneumonia. Aortic Atherosclerosis (ICD10-I70.0). Electronically Signed   By: Romona Curls M.D.   On: 10/24/2020 15:45        Scheduled Meds: . aspirin EC  81 mg Oral Daily  . brimonidine  1 drop Left Eye BID  . enoxaparin (LOVENOX) injection  40 mg Subcutaneous Q24H  . finasteride  5 mg Oral Daily  . mouth rinse  15 mL Mouth Rinse BID  . melatonin  2.5 mg Oral QHS  . midodrine  5 mg Oral TID WC  . oseltamivir  30 mg Oral BID  . tamsulosin  0.4 mg Oral Daily   Continuous Infusions: . azithromycin Stopped (10/25/20 0851)  . cefTRIAXone (ROCEPHIN)  IV       LOS: 0 days      Huey Bienenstock, MD [facility]   To contact the attending provider between 7A-7P or the covering provider during after hours 7P-7A, please log into the web site [email or web address] and access using universal [place] password for that web site. If you do not have the password, please call the hospital operator.  10/25/2020, 3:15 PM

## 2020-10-25 NOTE — ED Notes (Signed)
Pt up to commode with two person assist for bowel movment. Non skid socks placed on feet.

## 2020-10-25 NOTE — ED Notes (Signed)
Pt awake, alert talking and joking with staff. Denies dizziness. Pt states he has low blood pressure and "my top number is usually around 88 or 90, the bottom is usually in the 60s." skin normal color warm and dry.

## 2020-10-25 NOTE — ED Notes (Signed)
Secure chat message sent to brenda morrison, np to notify of blood pressure, awaiting response.

## 2020-10-26 LAB — BASIC METABOLIC PANEL
Anion gap: 10 (ref 5–15)
BUN: 24 mg/dL — ABNORMAL HIGH (ref 8–23)
CO2: 21 mmol/L — ABNORMAL LOW (ref 22–32)
Calcium: 8.4 mg/dL — ABNORMAL LOW (ref 8.9–10.3)
Chloride: 106 mmol/L (ref 98–111)
Creatinine, Ser: 1.25 mg/dL — ABNORMAL HIGH (ref 0.61–1.24)
GFR, Estimated: 55 mL/min — ABNORMAL LOW (ref >60)
Glucose, Bld: 112 mg/dL — ABNORMAL HIGH (ref 70–99)
Potassium: 3.8 mmol/L (ref 3.5–5.1)
Sodium: 137 mmol/L (ref 135–145)

## 2020-10-26 LAB — CBC
HCT: 33.2 % — ABNORMAL LOW (ref 39.0–52.0)
Hemoglobin: 11.1 g/dL — ABNORMAL LOW (ref 13.0–17.0)
MCH: 34.5 pg — ABNORMAL HIGH (ref 26.0–34.0)
MCHC: 33.4 g/dL (ref 30.0–36.0)
MCV: 103.1 fL — ABNORMAL HIGH (ref 80.0–100.0)
Platelets: 110 10*3/uL — ABNORMAL LOW (ref 150–400)
RBC: 3.22 MIL/uL — ABNORMAL LOW (ref 4.22–5.81)
RDW: 12.8 % (ref 11.5–15.5)
WBC: 6 10*3/uL (ref 4.0–10.5)
nRBC: 0 % (ref 0.0–0.2)

## 2020-10-26 LAB — LEGIONELLA PNEUMOPHILA SEROGP 1 UR AG: L. pneumophila Serogp 1 Ur Ag: NEGATIVE

## 2020-10-26 LAB — MRSA PCR SCREENING: MRSA by PCR: NEGATIVE

## 2020-10-26 LAB — PROCALCITONIN: Procalcitonin: <0.1 ng/mL

## 2020-10-26 MED ORDER — DORZOLAMIDE HCL 2 % OP SOLN
1.0000 [drp] | Freq: Two times a day (BID) | OPHTHALMIC | Status: DC
  Administered 2020-10-26 – 2020-10-27 (×2): 1 [drp] via OPHTHALMIC
  Filled 2020-10-26: qty 10

## 2020-10-26 MED ORDER — HYDROCODONE-HOMATROPINE 5-1.5 MG/5ML PO SYRP
5.0000 mL | ORAL_SOLUTION | Freq: Three times a day (TID) | ORAL | Status: DC | PRN
  Administered 2020-10-26: 22:00:00 5 mL via ORAL
  Filled 2020-10-26: qty 5

## 2020-10-26 MED ORDER — METHYLPREDNISOLONE SODIUM SUCC 40 MG IJ SOLR
40.0000 mg | Freq: Four times a day (QID) | INTRAMUSCULAR | Status: AC
  Administered 2020-10-26 – 2020-10-27 (×3): 40 mg via INTRAVENOUS
  Filled 2020-10-26 (×3): qty 1

## 2020-10-26 MED ORDER — LATANOPROST 0.005 % OP SOLN
1.0000 [drp] | Freq: Every day | OPHTHALMIC | Status: DC
  Administered 2020-10-26: 22:00:00 1 [drp] via OPHTHALMIC
  Filled 2020-10-26: qty 2.5

## 2020-10-26 MED ORDER — METOPROLOL SUCCINATE ER 25 MG PO TB24
12.5000 mg | ORAL_TABLET | Freq: Every day | ORAL | Status: DC
  Administered 2020-10-27: 12.5 mg via ORAL
  Filled 2020-10-26: qty 1

## 2020-10-26 NOTE — Progress Notes (Signed)
PT Cancellation Note  Patient Details Name: [name] MRN: [id number] DOB: [birth year]   Cancelled Treatment:    Reason Eval/Treat Not Completed: Other (comment). Pt politely declining PT at this time, referenced residual L arm pain due to what patient described as an infiltrated IV (problem has already been addressed by RN). Stated he may be up for it later in the PM; PT to re-attempt as able.  Olga Coaster PT, DPT 1:36 PM,10/26/20

## 2020-10-26 NOTE — TOC Initial Note (Signed)
Transition of Care [facility]) - Initial/Assessment Note    Patient Details  Name: [name] MRN: [id number] Date of Birth: 05/24/1929  [facility] CM/SW Contact:    Shelbie Hutching, RN Phone Number: 10/26/2020, 11:27 AM  Clinical Narrative:                 Patient admitted to the hospital with acute respiratory failure, found to be positive for influenza A.  RNCM met with patient at the bedside.  Patient is on acute O2 at 2L.  Patient is from home where he lives with his wife, son, and daughter in law.  Patient and wife moved up here from [place] in with their son.  They are in the process of converting a space into a suite for the patient and his wife.   Patient is independent at home he has a rollator, he drives.  Current with Dr. Dorthula Perfect for PCP, uses [facility] in [place] for prescriptions.   TOC will follow for needs.   Expected Discharge Plan: Home/Self Care Barriers to Discharge: Continued Medical Work up   Patient Goals and CMS Choice Patient states their goals for this hospitalization and ongoing recovery are:: to get over this coughing and function on his own      Expected Discharge Plan and Services Expected Discharge Plan: Home/Self Care       Living arrangements for the past 2 months: Single Family Home                 DME Arranged: N/A DME Agency: NA       HH Arranged: NA          Prior Living Arrangements/Services Living arrangements for the past 2 months: Single Family Home Lives with:: Adult Children,Spouse Patient language and need for interpreter reviewed:: Yes Do you feel safe going back to the place where you live?: Yes      Need for Family Participation in Patient Care: Yes (Comment) (influenza A) Care giver support system in place?: Yes (comment) (wife, son, daughter in Sports coach) Current home services: DME (rollator) Criminal Activity/Legal Involvement Pertinent to Current Situation/Hospitalization: No - Comment as needed  Activities of  Daily Living Home Assistive Devices/Equipment: Environmental consultant (specify type),Shower chair without back ADL Screening (condition at time of admission) Patient's cognitive ability adequate to safely complete daily activities?: Yes Is the patient deaf or have difficulty hearing?: No Does the patient have difficulty seeing, even when wearing glasses/contacts?: No Does the patient have difficulty concentrating, remembering, or making decisions?: No Patient able to express need for assistance with ADLs?: Yes Does the patient have difficulty dressing or bathing?: No Independently performs ADLs?: Yes (appropriate for developmental age) Does the patient have difficulty walking or climbing stairs?: No Weakness of Legs: None Weakness of Arms/Hands: None  Permission Sought/Granted Permission sought to share information with : Case Manager,Family Supports Permission granted to share information with : Yes, Verbal Permission Granted  Share Information with NAME: Vevelyn Royals     Permission granted to share info w Relationship: son     Emotional Assessment Appearance:: Appears stated age Attitude/Demeanor/Rapport: Engaged Affect (typically observed): Accepting Orientation: : Oriented to Self,Oriented to Place,Oriented to  Time,Oriented to Situation Alcohol / Substance Use: Not Applicable Psych Involvement: No (comment)  Admission diagnosis:  Community acquired pneumonia [J18.9] Acute respiratory failure with hypoxia ([place]) [J96.01] Community acquired pneumonia, unspecified laterality [J18.9] Patient Active Problem List   Diagnosis Date Noted  . Community acquired pneumonia 10/24/2020  . Acute respiratory failure  with hypoxia ([place]) 10/24/2020  . Acute-on-chronic kidney injury ([place]) 10/24/2020  . Influenza A 10/24/2020  . Elevated troponin 08/23/2020  . Diabetes mellitus without complication ([place]) 23/76/2831  . BPH (benign prostatic hyperplasia) 08/23/2020  . Postural dizziness with presyncope 08/23/2020   . AICD (automatic cardioverter/defibrillator) present 08/23/2020  . Carotid stenosis 08/23/2020  . Sacral decubitus ulcer 08/23/2020  . Near syncope 08/23/2020  . Chronic systolic CHF (congestive heart failure), NYHA class 3 ([place]) 07/20/2020  . Coronary artery disease involving native coronary artery of native heart 07/20/2020  . Hyperlipidemia, mixed 07/20/2020  . Atrial fibrillation ([place]) 07/06/2020   PCP:  Ezequiel Kayser, MD Pharmacy:   [facility] [place], [facility] [address] [place] [facility] [place] [address] Phone: [phone] Fax: [phone]     Social Determinants of Health (SDOH) Interventions    Readmission Risk Interventions Readmission Risk Prevention Plan 10/26/2020  Transportation Screening Complete  PCP or Specialist Appt within 3-5 Days Complete  HRI or [place] Complete  Social Work Consult for [place] Planning/Counseling Complete  Palliative Care Screening Not Applicable  Medication Review Press photographer) Complete  Some recent data might be hidden

## 2020-10-26 NOTE — Progress Notes (Addendum)
PROGRESS NOTE    [name]  ZOX:[id number] DOB: 09/02/1928 DOA: 10/24/2020 PCP: Mickey Farberhies, David, MD  Chief Complaint  Patient presents with  . Cough    Brief Narrative:   [name] is a 87 y.o. male with medical history significant for Chronic systolic HF s/p AICD, Atrial fibrillation not on anticoagulation, CAD, bilateral carotid stenosis, AAA, Type 2 DM and HLD who presents from urgent care for hypotension, and pneumonia.  Patient with body ache, weakness, coughing, decreased appetite, and failure to thrive, and diarrhea, his work-up was negative for COVID-19, but positive for influenza, as well he was noted to have soft blood pressure, so he was admitted for further management.  Subjective:  Patient still reports some cough, and dyspnea today.  Assessment & Plan:   Principal Problem:   Acute respiratory failure with hypoxia (HCC) Active Problems:   Elevated troponin   Chronic systolic CHF (congestive heart failure), NYHA class 3 (HCC)   Atrial fibrillation (HCC)   Diabetes mellitus without complication (HCC)   AICD (automatic cardioverter/defibrillator) present   Community acquired pneumonia   Acute-on-chronic kidney injury (HCC)   Influenza A  Acute hypoxic respiratory failure secondary to pneumonia/Influenza A  -3 L nasal cannula today, he was encouraged to use incentive spirometry, flutter valve, I have discussed with staff, to try to wean. -Chest x-ray significant for bilateral pneumonia(he does have some underlining chronic changes), but cannot rule out superimposed bacterial infection , his procalcitonin within normal range, he was empirically on azithromycin and Rocephin which I will discontinue today.   -Continue with Tamiflu - strep pneumo urinary antigen test is negative, Legionella pneumonia still pending -He is with significant wheezing this morning, so we will give IV Solu-Medrol x3 doses  AKI on CKD 3a Creatinine of 1.62 on admit from a prior of  1.22  Back to baseline with IV fluids.  Hypotension/near syncope -Likely due to volume depletion, appropriately received IV fluids, appears to be euvolemic today, IV fluid has been stopped. -his blood pressure remains on the lower side despite starting him on midodrine , I did D/W family, blood pressure usually sot with SBP in the 90's, so I will resume his toprol XL at 12.5 mg, and would resume entresto -Continue to monitor closely   Chronic systolic CHF (congestive heart failure), NYHA class 3 (HCC) AICD (automatic cardioverter/defibrillator) present -Initially dry, hydrated with IV fluids, appears euvolemic today -will resume toprol XL today, and entresto tomorrow if BP tolerate(soft BP at baseline)  Elevated troponin  Likely demand and possibly his baseline  Trending down which is reassuring  Atrial fibrillation Not On anticoagulation  Type 2 diabetes No hyperglycemia HbA1C 7.6 2/22  Coronary artery disease involving native coronary artery of native heart Hyperlipidemia, mixed -Continue aspirin and statin   BPH (benign prostatic hyperplasia) -Continue prostate meds   Sacral decubitus ulcer -Wound care evaluation  DVT prophylaxis: Lovenox Code Status: Partial Family Communication: left Voicemail for son on 4/25, unable to reach on 4/26 Disposition:   Status is: INPATIENT  The patient will require care spanning > 2 midnights and should be moved to inpatient because: IV treatments appropriate due to intensity of illness or inability to take PO  Dispo: The patient is from: Home              Anticipated d/c is to: Home likely will be able to go home tomorrow              Patient currently is not medically  stable to d/c.     Difficult to place patient No       Consultants:   NONE    Objective: Vitals:   10/25/20 2139 10/26/20 0339 10/26/20 0734 10/26/20 1155  BP: 102/66 108/66 (!) 97/57 107/74  Pulse: 91 72 77 (!) 58  Resp: (!) 22 18 20 18   Temp:  100 F (37.8 C) 98.1 F (36.7 C) 98.1 F (36.7 C) 97.6 F (36.4 C)  TempSrc:    Oral  SpO2: 91% 95% 96% 97%  Weight:      Height:        Intake/Output Summary (Last 24 hours) at 10/26/2020 1518 Last data filed at 10/26/2020 10/28/2020 Gross per 24 hour  Intake 100 ml  Output 1275 ml  Net -1175 ml   Filed Weights   10/24/20 2149  Weight: 75.3 kg    Examination:  Awake Alert, Oriented X 3, frail, no new F.N deficits, Normal affect Symmetrical Chest wall movement, Good air movement bilaterally, scattered B/L wheezing RRR,No Gallops,Rubs or new Murmurs, No Parasternal Heave +ve B.Sounds, Abd Soft, No tenderness, No rebound - guarding or rigidity. No Cyanosis, Clubbing or edema, No new Rash or bruise      Data Reviewed: I have personally reviewed following labs and imaging studies  CBC: Recent Labs  Lab 10/24/20 1654 10/25/20 0402 10/26/20 0410  WBC 10.2 9.7 6.0  NEUTROABS 5.6  --   --   HGB 13.2 11.8* 11.1*  HCT 40.5 36.5* 33.2*  MCV 103.1* 104.3* 103.1*  PLT 121* 126* 110*    Basic Metabolic Panel: Recent Labs  Lab 10/24/20 1654 10/25/20 0402 10/26/20 0410  NA 131* 132* 137  K 4.4 4.0 3.8  CL 95* 100 106  CO2 24 21* 21*  GLUCOSE 132* 125* 112*  BUN 32* 32* 24*  CREATININE 1.62* 1.53* 1.25*  CALCIUM 8.8* 8.1* 8.4*    GFR: Estimated Creatinine Clearance: 41.8 mL/min (A) (by C-G formula based on SCr of 1.25 mg/dL (H)).  Liver Function Tests: Recent Labs  Lab 10/24/20 1654  AST 32  ALT 13  ALKPHOS 52  BILITOT 0.7  PROT 7.7  ALBUMIN 3.5    CBG: No results for input(s): GLUCAP in the last 168 hours.   Recent Results (from the past 240 hour(s))  SARS CORONAVIRUS 2 (TAT 6-24 HRS) Nasopharyngeal Nasopharyngeal Swab     Status: None   Collection Time: 10/24/20  3:19 PM   Specimen: Nasopharyngeal Swab  Result Value Ref Range Status   SARS Coronavirus 2 NEGATIVE NEGATIVE Final    Comment: (NOTE) SARS-CoV-2 target nucleic acids are NOT  DETECTED.  The SARS-CoV-2 RNA is generally detectable in upper and lower respiratory specimens during the acute phase of infection. Negative results do not preclude SARS-CoV-2 infection, do not rule out co-infections with other pathogens, and should not be used as the sole basis for treatment or other patient management decisions. Negative results must be combined with clinical observations, patient history, and epidemiological information. The expected result is Negative.  Fact Sheet for Patients: 10/26/20  Fact Sheet for Healthcare Providers: HairSlick.no  This test is not yet approved or cleared by the [place]ir.[place] FDA and  has been authorized for detection and/or diagnosis of SARS-CoV-2 by FDA under an Emergency Use Authorization (EUA). This EUA will remain  in effect (meaning this test can be used) for the duration of the COVID-19 declaration under Se ction 564(b)(1) of the Act, 21 U.S.C. section 360bbb-3(b)(1), unless the authorization is terminated or  revoked sooner.  Performed at [facility], [address] [place], [place] [address]   Resp Panel by RT-PCR (Flu A&B, Covid) Nasopharyngeal Swab     Status: Abnormal   Collection Time: 10/24/20  4:54 PM   Specimen: Nasopharyngeal Swab; Nasopharyngeal(NP) swabs in vial transport medium  Result Value Ref Range Status   SARS Coronavirus 2 by RT PCR NEGATIVE NEGATIVE Final    Comment: (NOTE) SARS-CoV-2 target nucleic acids are NOT DETECTED.  The SARS-CoV-2 RNA is generally detectable in upper respiratory specimens during the acute phase of infection. The lowest concentration of SARS-CoV-2 viral copies this assay can detect is 138 copies/mL. A negative result does not preclude SARS-Cov-2 infection and should not be used as the sole basis for treatment or other patient management decisions. A negative result may occur with  improper specimen  collection/handling, submission of specimen other than nasopharyngeal swab, presence of viral mutation(s) within the areas targeted by this assay, and inadequate number of viral copies(<138 copies/mL). A negative result must be combined with clinical observations, patient history, and epidemiological information. The expected result is Negative.  Fact Sheet for Patients:  BloggerCourse.com  Fact Sheet for Healthcare Providers:  SeriousBroker.it  This test is no t yet approved or cleared by the [place] FDA and  has been authorized for detection and/or diagnosis of SARS-CoV-2 by FDA under an Emergency Use Authorization (EUA). This EUA will remain  in effect (meaning this test can be used) for the duration of the COVID-19 declaration under Section 564(b)(1) of the Act, 21 U.S.C.section 360bbb-3(b)(1), unless the authorization is terminated  or revoked sooner.       Influenza A by PCR POSITIVE (A) NEGATIVE Final   Influenza B by PCR NEGATIVE NEGATIVE Final    Comment: (NOTE) The Xpert Xpress SARS-CoV-2/FLU/RSV plus assay is intended as an aid in the diagnosis of influenza from Nasopharyngeal swab specimens and should not be used as a sole basis for treatment. Nasal washings and aspirates are unacceptable for Xpert Xpress SARS-CoV-2/FLU/RSV testing.  Fact Sheet for Patients: BloggerCourse.com  Fact Sheet for Healthcare Providers: SeriousBroker.it  This test is not yet approved or cleared by the [place] FDA and has been authorized for detection and/or diagnosis of SARS-CoV-2 by FDA under an Emergency Use Authorization (EUA). This EUA will remain in effect (meaning this test can be used) for the duration of the COVID-19 declaration under Section 564(b)(1) of the Act, 21 U.S.C. section 360bbb-3(b)(1), unless the authorization is terminated or revoked.  Performed at  [facility], [address] [place], [place] [address]   Blood culture (routine x 2)     Status: None (Preliminary result)   Collection Time: 10/24/20  7:14 PM   Specimen: BLOOD RIGHT HAND  Result Value Ref Range Status   Specimen Description BLOOD RIGHT HAND  Final   Special Requests   Final    BOTTLES DRAWN AEROBIC AND ANAEROBIC Blood Culture adequate volume   Culture   Final    NO GROWTH 2 DAYS Performed at [facility], [address], [place], [place] [address]    Report Status PENDING  Incomplete  Blood culture (routine x 2)     Status: None (Preliminary result)   Collection Time: 10/24/20  7:33 PM   Specimen: BLOOD RIGHT FOREARM  Result Value Ref Range Status   Specimen Description BLOOD RIGHT FOREARM  Final   Special Requests   Final    BOTTLES DRAWN AEROBIC AND ANAEROBIC Blood Culture adequate  volume   Culture   Final    NO GROWTH 2 DAYS Performed at [facility], [address] [name], [place] [address]    Report Status PENDING  Incomplete  MRSA PCR Screening     Status: None   Collection Time: 10/26/20  1:17 PM   Specimen: Nasopharyngeal  Result Value Ref Range Status   MRSA by PCR NEGATIVE NEGATIVE Final    Comment:        The GeneXpert MRSA Assay (FDA approved for NASAL specimens only), is one component of a comprehensive MRSA colonization surveillance program. It is not intended to diagnose MRSA infection nor to guide or monitor treatment for MRSA infections. Performed at [facility], [address], [place], [place] [address]          Radiology Studies: DG Chest 2 View  Result Date: 10/24/2020 CLINICAL DATA:  Shortness of breath and cough. EXAM: CHEST - 2 VIEW COMPARISON:  Chest radiograph dated 08/23/2020. FINDINGS: The heart size is borderline enlarged. Vascular calcifications are seen in the aortic arch. Mild patchy bilateral interstitial opacities are decreased compared to prior exam. There is no  pleural effusion or pneumothorax. The visualized skeletal structures are unremarkable. A left subclavian approach cardiac device is noted. IMPRESSION: Mild bilateral patchy interstitial opacities, decreased compared to prior exam, likely represent pneumonia. Aortic Atherosclerosis (ICD10-I70.0). Electronically Signed   By: Romona Curls M.D.   On: 10/24/2020 15:45        Scheduled Meds: . aspirin EC  81 mg Oral Daily  . brimonidine  1 drop Left Eye BID  . enoxaparin (LOVENOX) injection  40 mg Subcutaneous Q24H  . finasteride  5 mg Oral Daily  . mouth rinse  15 mL Mouth Rinse BID  . melatonin  2.5 mg Oral QHS  . midodrine  5 mg Oral TID WC  . oseltamivir  30 mg Oral BID  . tamsulosin  0.4 mg Oral Daily   Continuous Infusions: . azithromycin 500 mg (10/26/20 1034)  . cefTRIAXone (ROCEPHIN)  IV Stopped (10/26/20 0319)     LOS: 1 day      Huey Bienenstock, MD [facility]   To contact the attending provider between 7A-7P or the covering provider during after hours 7P-7A, please log into the web site [email or web address] and access using universal [place] password for that web site. If you do not have the password, please call the hospital operator.  10/26/2020, 3:18 PM

## 2020-10-27 DIAGNOSIS — N400 Enlarged prostate without lower urinary tract symptoms: Secondary | ICD-10-CM

## 2020-10-27 LAB — CBC
HCT: 35.6 % — ABNORMAL LOW (ref 39.0–52.0)
Hemoglobin: 11.6 g/dL — ABNORMAL LOW (ref 13.0–17.0)
MCH: 33.4 pg (ref 26.0–34.0)
MCHC: 32.6 g/dL (ref 30.0–36.0)
MCV: 102.6 fL — ABNORMAL HIGH (ref 80.0–100.0)
Platelets: 117 10*3/uL — ABNORMAL LOW (ref 150–400)
RBC: 3.47 MIL/uL — ABNORMAL LOW (ref 4.22–5.81)
RDW: 12.8 % (ref 11.5–15.5)
WBC: 2.9 10*3/uL — ABNORMAL LOW (ref 4.0–10.5)
nRBC: 0 % (ref 0.0–0.2)

## 2020-10-27 LAB — BASIC METABOLIC PANEL
Anion gap: 8 (ref 5–15)
BUN: 21 mg/dL (ref 8–23)
CO2: 23 mmol/L (ref 22–32)
Calcium: 8.7 mg/dL — ABNORMAL LOW (ref 8.9–10.3)
Chloride: 105 mmol/L (ref 98–111)
Creatinine, Ser: 1.03 mg/dL (ref 0.61–1.24)
GFR, Estimated: >60 mL/min (ref >60)
Glucose, Bld: 251 mg/dL — ABNORMAL HIGH (ref 70–99)
Potassium: 4.3 mmol/L (ref 3.5–5.1)
Sodium: 136 mmol/L (ref 135–145)

## 2020-10-27 MED ORDER — OSELTAMIVIR PHOSPHATE 30 MG PO CAPS: 30.0000 mg | ORAL_CAPSULE | Freq: Two times a day (BID) | ORAL | 0 refills | Status: AC

## 2020-10-27 MED ORDER — ENTRESTO 24-26 MG PO TABS: ORAL_TABLET | ORAL | Status: DC

## 2020-10-27 MED ORDER — MIDODRINE HCL 5 MG PO TABS: 5.0000 mg | ORAL_TABLET | Freq: Three times a day (TID) | ORAL | 1 refills | Status: DC

## 2020-10-27 MED ORDER — ALBUTEROL SULFATE (2.5 MG/3ML) 0.083% IN NEBU: 3.0000 mL | INHALATION_SOLUTION | Freq: Two times a day (BID) | RESPIRATORY_TRACT | Status: DC

## 2020-10-27 MED ORDER — ALBUTEROL SULFATE HFA 108 (90 BASE) MCG/ACT IN AERS: 2.0000 | INHALATION_SPRAY | Freq: Four times a day (QID) | RESPIRATORY_TRACT | 0 refills | Status: DC | PRN

## 2020-10-27 MED ORDER — FUROSEMIDE 40 MG PO TABS: 40.0000 mg | ORAL_TABLET | Freq: Every day | ORAL | Status: DC

## 2020-10-27 MED ORDER — ALBUTEROL SULFATE (2.5 MG/3ML) 0.083% IN NEBU: 3.0000 mL | INHALATION_SOLUTION | Freq: Three times a day (TID) | RESPIRATORY_TRACT | Status: DC

## 2020-10-27 NOTE — Progress Notes (Addendum)
Dressing changed per orders by Toniann Ket SWOT RN

## 2020-10-27 NOTE — Progress Notes (Signed)
SATURATION QUALIFICATIONS: (This note is used to comply with regulatory documentation for home oxygen)    Patient Saturations on Room Air while Ambulating = 86%  Patient Saturations on 3 Liters of oxygen while Ambulating = 91%

## 2020-10-27 NOTE — TOC Transition Note (Addendum)
Transition of Care [facility]) - CM/SW Discharge Note   Patient Details  Name: [name] MRN: [id number] Date of Birth: 06-05-1929  Transition [facility] CM/SW Contact:  Allayne Butcher, RN Phone Number: 10/27/2020, 1:33 PM   Clinical Narrative:    Patient is medically cleared for discharge home today but he will need oxygen to discharge home on.  [facility] given referral for home O2 and they will deliver to the patient's room before discharge.   Final next level of care: Home w Home Health Services Barriers to Discharge: Barriers Resolved   Patient Goals and CMS Choice Patient states their goals for this hospitalization and ongoing recovery are:: Family would like to take patient home today CMS Medicare.gov Compare Post Acute Care list provided to:: Patient Represenative (must comment) Choice offered to / list presented to : Adult Children  Discharge Placement                       Discharge Plan and Services                DME Arranged: N/A DME Agency: NA       HH Arranged: RN,PT,OT HH Agency: [facility] Date HH Agency Contacted: 10/27/20 Time HH Agency Contacted: 1333 Representative spoke with at [facility] Agency: Barbara Cower  Social Determinants of Health (SDOH) Interventions     Readmission Risk Interventions Readmission Risk Prevention Plan 10/26/2020  Transportation Screening Complete  PCP or Specialist Appt within 3-5 Days Complete  HRI or Home Care Consult Complete  Social Work Consult for Recovery Care Planning/Counseling Complete  Palliative Care Screening Not Applicable  Medication Review Oceanographer) Complete  Some recent data might be hidden

## 2020-10-27 NOTE — Progress Notes (Signed)
Pt being discharged home, discharge instructions reviewed with pt and Clydie Braun, states understanding, pt with no complaints at discharge, o2 in place

## 2020-10-27 NOTE — Discharge Summary (Signed)
Physician Discharge Summary  [name] IEP:[id number] DOB: [birth year] DOA: 10/24/2020  PCP: Mickey Farber, MD  Admit date: 10/24/2020 Discharge date: 10/27/2020  Time spent: 60 minutes  Recommendations for Outpatient Follow-up:  1. Follow-up with Mickey Farber, MD in 1 to 2 weeks.  On follow-up patient will need a basic metabolic profile done to follow-up on electrolytes and renal function.  Patient blood pressure need to be reassessed as well as patient's cardiac issues.  Patient's Entresto and Lasix to be resumed 5 days postdischarge.  Patient with will need ambulatory sats checked again to see whether patient requires ongoing home oxygen.   Discharge Diagnoses:  Principal Problem:   Acute respiratory failure with hypoxia (HCC) Active Problems:   Elevated troponin   Chronic systolic CHF (congestive heart failure), NYHA class 3 (HCC)   Atrial fibrillation (HCC)   Diabetes mellitus without complication (HCC)   AICD (automatic cardioverter/defibrillator) present   Community acquired pneumonia   Acute-on-chronic kidney injury (HCC)   Influenza A   Discharge Condition: Stable and improved  Diet recommendation: Heart healthy  Filed Weights   10/24/20 2149  Weight: 75.3 kg    History of present illness:  HPI per Dr. Dayton Martes is a 87 y.o. male with medical history significant for Chronic systolic HF s/p AICD, Atrial fibrillation not on anticoagulation, CAD, bilateral carotid stenosis, AAA, Type 2 DM and HLD who presents from urgent care for hypotension, and pneumonia.  Patient recently moved down from [place] in January and currently living with his son's family.  Last week his grandson came home with some cold-like symptoms.  Tested negative for COVID with a home test.  However than 3 days ago he began to develop symptoms of body ache, weakness and coughing.  Has decreased appetite.    On day of admission, he also began to have diarrhea.  Has upper abdominal pain  that is worse with coughing.  He presented to urgent care on day of admission, and while getting into the office he felt weak like his legs gave out and felt dizzy.  He normally ambulates with a walker.  He was noted to be hypotensive in the systolic of 80s.  Chest x-ray done at urgent care showed bilateral pneumonia and he was asked to present to the ED for further evaluation.  In the ED, he was afebrile, tachypneic and placed on 2 L via nasal cannula.  He was hypertensive with systolic of 150s.  No leukocytosis but lactate of 2.3.  Sodium of 131, creatinine of 1.62 from prior 1.22.  BG of 132.  Troponin mildly elevated at 79.  EKG shows atrial fibrillation with PVCs.  Hospital Course:  #1 acute hypoxic respiratory failure secondary to pneumonia/influenza A -Patient had presented with respiratory symptoms including myalgias, weakness and cough.  Patient also noted to be hypotensive on admission and antihypertensive medications held.  Chest x-ray done was significant for bilateral pneumonia (patient does have some underlying chronic changes), but cannot rule out superimposed bacterial infection. -Influenza PCR was positive for influenza A. -Patient initially placed empirically on IV Rocephin and azithromycin and Tamiflu -Procalcitonin obtained was within normal limits. -Patient maintained on oxygen during the hospitalization. -Urine strep pneumococcus antigen was negative. -Urine Legionella antigen was negative. -IV antibiotics were discontinued. -Patient placed on nebs twice daily as well as Hycodan as needed for cough. -Patient improved clinically and was anxious to be discharged home. -Patient be discharged home on 3 more days of Tamiflu to complete a  5-day course, albuterol MDI twice daily for the next 5 days and then every 6 hours as needed and home O2. -Outpatient follow-up with PCP.  2.  Acute kidney injury on chronic kidney disease stage IIIA -Creatinine on admission noted at 1.62 with a  prior creatinine of 1.22. -Felt likely secondary to prerenal azotemia in the setting of Entresto and diuretics. -Entresto and diuretics were held and patient hydrated with IV fluids. -Renal function improved and by day of discharge creatinine was down to 103. -Entresto and Lasix to be resumed 5 days post discharge. -Outpatient follow-up with PCP.  3.  Hypotension/near syncope -Felt likely secondary to volume depletion in the setting of diuretics. -Patient's diuretics and Entresto were held. -Patient hydrated with IV fluids with improvement with blood pressure. -Patient blood pressure noted to remain on the lower side and patient subsequently started on midodrine. -Dr.Elgergawy discussed with family and it was noted patient's blood pressure usually soft with systolic blood pressures in the 90s and as such patient's Toprol-XL 12.5 mg resumed. -Patient be discharged on midodrine 5 mg 3 times daily and patient's Lasix and Entresto to be resumed 5 days post discharge. -Outpatient follow-up with PCP.  4.  Chronic systolic CHF/status post AICD -Patient on admission initially noted to be dehydrated hydrated with IV fluids. -Patient's Entresto and Lasix were held due to acute kidney injury and hypotension on admission. -Toprol-XL resumed. -Lasix and Entresto to be resumed 5 days post discharge. -Outpatient follow-up with PCP.  5.  Elevated troponin -Felt secondary to demand ischemia. -Denies any chest pain. -Troponins trended down. -No further cardiac work-up needed. -Outpatient follow-up.  6.  Paroxysmal atrial fibrillation -Toprol-XL initially held and subsequently resumed during the hospitalization.  Rate was controlled. -Patient noted not to be on anticoagulation prior to admission.  Outpatient follow-up.  7.  BPH -Patient placed back on home regimen of medications.  8.  Coronary artery disease/hyperlipidemia -Remained stable. -Patient maintained on home regimen of aspirin and  statin. -Toprol-XL resumed. -Entresto and Lasix held due to acute kidney injury and hypotension and will be resumed 5 days post discharge. -Outpatient follow-up.  9.  Sacral decubitus ulcer,POA Pressure Injury 10/25/20 Buttocks Left Unstageable - Full thickness tissue loss in which the base of the injury is covered by slough (yellow, tan, gray, green or brown) and/or eschar (tan, brown or black) in the wound bed. healed area on right buttock, o (Active)  10/25/20 1427  Location: Buttocks  Location Orientation: Left  Staging: Unstageable - Full thickness tissue loss in which the base of the injury is covered by slough (yellow, tan, gray, green or brown) and/or eschar (tan, brown or black) in the wound bed.  Wound Description (Comments): healed area on right buttock, open area on left buttock  Present on Admission: Yes  -Patient seen by wound care and dressing changes recommended.    Procedures:  Chest x-ray 10/24/2020    Consultations:  None  Discharge Exam: Vitals:   10/27/20 0825 10/27/20 1109  BP: 113/90 127/60  Pulse: (!) 59 64  Resp: 18 20  Temp: 98.4 F (36.9 C) (!) 97.5 F (36.4 C)  SpO2: 99% 97%    General: NAD Cardiovascular: RRR Respiratory: Some coarse breath sounds bibasilar.  Fair air movement.  No wheezing.  Speaking in full sentences.  Discharge Instructions   Discharge Instructions    Diet - low sodium heart healthy   Complete by: As directed    Discharge wound care:   Complete by: As directed  As above   Increase activity slowly   Complete by: As directed      Allergies as of 10/27/2020   No Known Allergies     Medication List    TAKE these medications   acetaminophen 500 MG tablet Commonly known as: TYLENOL Take 1 tablet (500 mg total) by mouth every 6 (six) hours as needed.   albuterol 108 (90 Base) MCG/ACT inhaler Commonly known as: VENTOLIN HFA Inhale 2 puffs into the lungs every 6 (six) hours as needed for wheezing or shortness  of breath. Use 2 times daily x5 days, then every 6 hours as needed.   aspirin 81 MG EC tablet Take 81 mg by mouth daily.   brimonidine 0.1 % Soln Commonly known as: ALPHAGAN P Place 1 drop into the left eye 2 (two) times daily.   dorzolamide 2 % ophthalmic solution Commonly known as: TRUSOPT Place 1 drop into both eyes 2 (two) times daily.   Entresto 24-26 MG Generic drug: sacubitril-valsartan SMARTSIG:1 Tablet(s) By Mouth Every 12 Hours Start taking on: Nov 01, 2020 What changed: These instructions start on Nov 01, 2020. If you are unsure what to do until then, ask your doctor or other care provider.   finasteride 5 MG tablet Commonly known as: PROSCAR Take 5 mg by mouth daily.   furosemide 40 MG tablet Commonly known as: LASIX Take 1 tablet (40 mg total) by mouth daily. Start taking on: Nov 01, 2020 What changed: These instructions start on Nov 01, 2020. If you are unsure what to do until then, ask your doctor or other care provider.   gabapentin 300 MG capsule Commonly known as: NEURONTIN Take 300 mg by mouth 2 (two) times daily.   guaiFENesin 600 MG 12 hr tablet Commonly known as: MUCINEX Take 600 mg by mouth 2 (two) times daily.   latanoprost 0.005 % ophthalmic solution Commonly known as: XALATAN Place 1 drop into both eyes at bedtime.   metFORMIN 500 MG tablet Commonly known as: GLUCOPHAGE Take 500 mg by mouth daily with breakfast.   metoprolol succinate 25 MG 24 hr tablet Commonly known as: TOPROL-XL Take 25 mg by mouth daily.   midodrine 5 MG tablet Commonly known as: PROAMATINE Take 1 tablet (5 mg total) by mouth 3 (three) times daily with meals.   Multi-Vitamin tablet Take 1 tablet by mouth daily.   oseltamivir 30 MG capsule Commonly known as: TAMIFLU Take 1 capsule (30 mg total) by mouth 2 (two) times daily for 3 days.   tamsulosin 0.4 MG Caps capsule Commonly known as: FLOMAX Take 0.4 mg by mouth daily.            Durable Medical Equipment   (From admission, onward)         Start     Ordered   10/27/20 1139  For home use only DME oxygen  Once       Question Answer Comment  Length of Need 6 Months   Mode or (Route) Nasal cannula   Liters per Minute 3   Frequency Continuous (stationary and portable oxygen unit needed)   Oxygen conserving device Yes   Oxygen delivery system Gas      10/27/20 1138           Discharge Care Instructions  (From admission, onward)         Start     Ordered   10/27/20 0000  Discharge wound care:       Comments: As above  10/27/20 1431         No Known Allergies  Follow-up Information    Mickey Farberhies, David, MD. Schedule an appointment as soon as possible for a visit in 1 week(s).   Specialty: Internal Medicine Why: f/u in 1-2 weeks. Contact information: [address] [facility] [place] [id number] [phone]                The results of significant diagnostics from this hospitalization (including imaging, microbiology, ancillary and laboratory) are listed below for reference.    Significant Diagnostic Studies: DG Chest 2 View  Result Date: 10/24/2020 CLINICAL DATA:  Shortness of breath and cough. EXAM: CHEST - 2 VIEW COMPARISON:  Chest radiograph dated 08/23/2020. FINDINGS: The heart size is borderline enlarged. Vascular calcifications are seen in the aortic arch. Mild patchy bilateral interstitial opacities are decreased compared to prior exam. There is no pleural effusion or pneumothorax. The visualized skeletal structures are unremarkable. A left subclavian approach cardiac device is noted. IMPRESSION: Mild bilateral patchy interstitial opacities, decreased compared to prior exam, likely represent pneumonia. Aortic Atherosclerosis (ICD10-I70.0). Electronically Signed   By: Romona Curlsyler  Litton M.D.   On: 10/24/2020 15:45    Microbiology: Recent Results (from the past 240 hour(s))  SARS CORONAVIRUS 2 (TAT 6-24 HRS) Nasopharyngeal Nasopharyngeal Swab      Status: None   Collection Time: 10/24/20  3:19 PM   Specimen: Nasopharyngeal Swab  Result Value Ref Range Status   SARS Coronavirus 2 NEGATIVE NEGATIVE Final    Comment: (NOTE) SARS-CoV-2 target nucleic acids are NOT DETECTED.  The SARS-CoV-2 RNA is generally detectable in upper and lower respiratory specimens during the acute phase of infection. Negative results do not preclude SARS-CoV-2 infection, do not rule out co-infections with other pathogens, and should not be used as the sole basis for treatment or other patient management decisions. Negative results must be combined with clinical observations, patient history, and epidemiological information. The expected result is Negative.  Fact Sheet for Patients: HairSlick.nohttps://www.fda.gov/media/138098/download  Fact Sheet for Healthcare Providers: quierodirigir.comhttps://www.fda.gov/media/138095/download  This test is not yet approved or cleared by the [place] FDA and  has been authorized for detection and/or diagnosis of SARS-CoV-2 by FDA under an Emergency Use Authorization (EUA). This EUA will remain  in effect (meaning this test can be used) for the duration of the COVID-19 declaration under Se ction 564(b)(1) of the Act, 21 U.S.C. section 360bbb-3(b)(1), unless the authorization is terminated or revoked sooner.  Performed at [facility], [address] [place], [place] [address]   Resp Panel by RT-PCR (Flu A&B, Covid) Nasopharyngeal Swab     Status: Abnormal   Collection Time: 10/24/20  4:54 PM   Specimen: Nasopharyngeal Swab; Nasopharyngeal(NP) swabs in vial transport medium  Result Value Ref Range Status   SARS Coronavirus 2 by RT PCR NEGATIVE NEGATIVE Final    Comment: (NOTE) SARS-CoV-2 target nucleic acids are NOT DETECTED.  The SARS-CoV-2 RNA is generally detectable in upper respiratory specimens during the acute phase of infection. The lowest concentration of SARS-CoV-2 viral copies this assay can detect is 138  copies/mL. A negative result does not preclude SARS-Cov-2 infection and should not be used as the sole basis for treatment or other patient management decisions. A negative result may occur with  improper specimen collection/handling, submission of specimen other than nasopharyngeal swab, presence of viral mutation(s) within the areas targeted by this assay, and inadequate number of viral copies(<138 copies/mL). A negative result must be combined with  clinical observations, patient history, and epidemiological information. The expected result is Negative.  Fact Sheet for Patients:  BloggerCourse.com  Fact Sheet for Healthcare Providers:  SeriousBroker.it  This test is no t yet approved or cleared by the [place] FDA and  has been authorized for detection and/or diagnosis of SARS-CoV-2 by FDA under an Emergency Use Authorization (EUA). This EUA will remain  in effect (meaning this test can be used) for the duration of the COVID-19 declaration under Section 564(b)(1) of the Act, 21 U.S.C.section 360bbb-3(b)(1), unless the authorization is terminated  or revoked sooner.       Influenza A by PCR POSITIVE (A) NEGATIVE Final   Influenza B by PCR NEGATIVE NEGATIVE Final    Comment: (NOTE) The Xpert Xpress SARS-CoV-2/FLU/RSV plus assay is intended as an aid in the diagnosis of influenza from Nasopharyngeal swab specimens and should not be used as a sole basis for treatment. Nasal washings and aspirates are unacceptable for Xpert Xpress SARS-CoV-2/FLU/RSV testing.  Fact Sheet for Patients: BloggerCourse.com  Fact Sheet for Healthcare Providers: SeriousBroker.it  This test is not yet approved or cleared by the [place] FDA and has been authorized for detection and/or diagnosis of SARS-CoV-2 by FDA under an Emergency Use Authorization (EUA). This EUA will remain in effect  (meaning this test can be used) for the duration of the COVID-19 declaration under Section 564(b)(1) of the Act, 21 U.S.C. section 360bbb-3(b)(1), unless the authorization is terminated or revoked.  Performed at [facility], [address] [place], [place] [address]   Blood culture (routine x 2)     Status: None (Preliminary result)   Collection Time: 10/24/20  7:14 PM   Specimen: BLOOD RIGHT HAND  Result Value Ref Range Status   Specimen Description BLOOD RIGHT HAND  Final   Special Requests   Final    BOTTLES DRAWN AEROBIC AND ANAEROBIC Blood Culture adequate volume   Culture   Final    NO GROWTH 3 DAYS Performed at [facility], [address] [place], [place] [address]    Report Status PENDING  Incomplete  Blood culture (routine x 2)     Status: None (Preliminary result)   Collection Time: 10/24/20  7:33 PM   Specimen: BLOOD RIGHT FOREARM  Result Value Ref Range Status   Specimen Description BLOOD RIGHT FOREARM  Final   Special Requests   Final    BOTTLES DRAWN AEROBIC AND ANAEROBIC Blood Culture adequate volume   Culture   Final    NO GROWTH 3 DAYS Performed at [facility], [address] [place], [place] [address]    Report Status PENDING  Incomplete  MRSA PCR Screening     Status: None   Collection Time: 10/26/20  1:17 PM   Specimen: Nasopharyngeal  Result Value Ref Range Status   MRSA by PCR NEGATIVE NEGATIVE Final    Comment:        The GeneXpert MRSA Assay (FDA approved for NASAL specimens only), is one component of a comprehensive MRSA colonization surveillance program. It is not intended to diagnose MRSA infection nor to guide or monitor treatment for MRSA infections. Performed at [facility], [address] [place], [place] [address]      Labs: Basic Metabolic Panel: Recent Labs  Lab 10/24/20 1654 10/25/20 0402 10/26/20 0410 10/27/20 0348  NA 131* 132* 137 136  K 4.4 4.0 3.8 4.3  CL 95* 100 106  105  CO2 24 21* 21* 23  GLUCOSE 132*  125* 112* 251*  BUN 32* 32* 24* 21  CREATININE 1.62* 1.53* 1.25* 1.03  CALCIUM 8.8* 8.1* 8.4* 8.7*   Liver Function Tests: Recent Labs  Lab 10/24/20 1654  AST 32  ALT 13  ALKPHOS 52  BILITOT 0.7  PROT 7.7  ALBUMIN 3.5   No results for input(s): LIPASE, AMYLASE in the last 168 hours. No results for input(s): AMMONIA in the last 168 hours. CBC: Recent Labs  Lab 10/24/20 1654 10/25/20 0402 10/26/20 0410 10/27/20 0348  WBC 10.2 9.7 6.0 2.9*  NEUTROABS 5.6  --   --   --   HGB 13.2 11.8* 11.1* 11.6*  HCT 40.5 36.5* 33.2* 35.6*  MCV 103.1* 104.3* 103.1* 102.6*  PLT 121* 126* 110* 117*   Cardiac Enzymes: No results for input(s): CKTOTAL, CKMB, CKMBINDEX, TROPONINI in the last 168 hours. BNP: BNP (last 3 results) Recent Labs    08/22/20 2345 10/24/20 1914  BNP 533.0* 379.3*    ProBNP (last 3 results) No results for input(s): PROBNP in the last 8760 hours.  CBG: No results for input(s): GLUCAP in the last 168 hours.     Signed:  Ramiro Harvest MD.  [facility] 10/27/2020, 2:31 PM

## 2020-10-27 NOTE — Progress Notes (Signed)
Physical Therapy Evaluation Patient Details Name: [name] MRN: [id number] DOB: August 24, 1928 Today's Date: 10/27/2020   History of Present Illness  87 y.o. male with medical history significant for Chronic systolic HF s/p AICD, Atrial fibrillation not on anticoagulation, CAD, bilateral carotid stenosis, AAA, Type 2 DM and HLD who presents from urgent care for hypotension, and pneumonia.   Tested + for flu with associated symptoms, feeling much better at time of PT exam.  Clinical Impression  Pt did very well with mobility, has functional strength and balance and generally did well with most aspects of PT.  His issue, however, was regarding O2.  On arrival he was on 3L and O2 was in the high 90s.  He did well with mobility, etc but did have a drop in O2 to the low 90s with bed mobility, etc.  He was able to rise to standing and ambulate safely and with confidence but O2 kept dropping and did get down to 86% with just 35 ft of ambulation.  Did reapply O2 and sats only slow rose back to the low 90s.  Pt did move well and reports feeling good about being able to return home w/o further PT intervention.  He assures me has a Nurse, mental health and that as his daughter-in-law is a Engineer, civil (consulting) he is confident about being able to safely return to his normal activity level as O2 situation improves.      Follow Up Recommendations Supervision - Intermittent;No PT follow up (Pt does not feel he needs nor wishes to have HHPT)    Equipment Recommendations  None recommended by PT    Recommendations for Other Services       Precautions / Restrictions Precautions Precautions: Fall Restrictions Weight Bearing Restrictions: No      Mobility  Bed Mobility Overal bed mobility: Independent             General bed mobility comments: easily and confidently gets to sitting EOB    Transfers Overall transfer level: Modified independent Equipment used: Rolling walker (2 wheeled)             General  transfer comment: Pt able to rise and control descent relatively easily.  No assis and very minimal cuing required  Ambulation/Gait Ambulation/Gait assistance: Supervision Gait Distance (Feet): 35 Feet Assistive device: Rolling walker (2 wheeled)       General Gait Details: Pt with no safety issues or unsteadiness during ambulation.  He uses a 4WW at baseline, did well with FWW here in hospital.  Attempted to ambulation on room air with sats dropping into the mid 80s, Pt with some fatigue but no excessive shortness of breath.  Stairs            Wheelchair Mobility    Modified Rankin (Stroke Patients Only)       Balance Overall balance assessment: Independent                                           Pertinent Vitals/Pain Pain Assessment: No/denies pain    Home Living Family/patient expects to be discharged to:: Private residence Living Arrangements: Spouse/significant other;Children Available Help at Discharge: Family;Available 24 hours/day Type of Home: House Home Access: Stairs to enter Entrance Stairs-Rails: None Entrance Stairs-Number of Steps: 1 Home Layout: One level Home Equipment: Walker - 4 wheels;Bedside commode;Wheelchair - manual;Shower seat      Prior Function Level  of Independence: Independent with assistive device(s)   Gait / Transfers Assistance Needed: Mod I with rollator for community ambulation  ADL's / Homemaking Assistance Needed: Mod I for ADL's, except pt notes he requires assistance drying off and donning/doffing clothes after showering; pt does not need assist when donning/doffing clothes otherwise. Son/DIL assist with IADL's.        Hand Dominance        Extremity/Trunk Assessment   Upper Extremity Assessment Upper Extremity Assessment: Overall WFL for tasks assessed    Lower Extremity Assessment Lower Extremity Assessment: Overall WFL for tasks assessed       Communication   Communication: No  difficulties  Cognition Arousal/Alertness: Awake/alert Behavior During Therapy: WFL for tasks assessed/performed Overall Cognitive Status: Within Functional Limits for tasks assessed                                        General Comments General comments (skin integrity, edema, etc.): on arrival (on 3L) O2 at 99%, remains in mid/low 90s on room air during bed mobility/information gathering, O2 dropped to 86% during modest ambulation effort.  Reapplied 3L and pt too >2 minutes for O2 to be >92%    Exercises     Assessment/Plan    PT Assessment Patient needs continued PT services  PT Problem List Decreased strength;Decreased activity tolerance;Decreased balance;Decreased knowledge of use of DME;Decreased safety awareness;Cardiopulmonary status limiting activity       PT Treatment Interventions Gait training;Functional mobility training;Therapeutic activities;Therapeutic exercise    PT Goals (Current goals can be found in the Care Plan section)  Acute Rehab PT Goals Patient Stated Goal: go home PT Goal Formulation: With patient Time For Goal Achievement: 10/27/20 Potential to Achieve Goals: Good    Frequency Min 2X/week   Barriers to discharge        Co-evaluation               AM-PAC PT "6 Clicks" Mobility  Outcome Measure Help needed turning from your back to your side while in a flat bed without using bedrails?: None Help needed moving from lying on your back to sitting on the side of a flat bed without using bedrails?: None Help needed moving to and from a bed to a chair (including a wheelchair)?: None Help needed standing up from a chair using your arms (e.g., wheelchair or bedside chair)?: None Help needed to walk in hospital room?: None Help needed climbing 3-5 steps with a railing? : A Little 6 Click Score: 23    End of Session Equipment Utilized During Treatment: Oxygen Activity Tolerance: Patient limited by fatigue Patient left: with  chair alarm set;with call bell/phone within reach Nurse Communication: Mobility status (O2 during ambulation on room air) PT Visit Diagnosis: Difficulty in walking, not elsewhere classified (R26.2)    Time: 1638-4665 PT Time Calculation (min) (ACUTE ONLY): 23 min   Charges:   PT Evaluation $PT Eval Low Complexity: 1 Low PT Treatments $Gait Training: 8-22 mins        Malachi Pro, DPT 10/27/2020, 10:36 AM

## 2020-10-29 ENCOUNTER — Encounter: Payer: Medicare Other | Admitting: Internal Medicine

## 2020-10-29 ENCOUNTER — Other Ambulatory Visit: Payer: Self-pay

## 2020-10-29 DIAGNOSIS — I5042 Chronic combined systolic (congestive) and diastolic (congestive) heart failure: Secondary | ICD-10-CM | POA: Diagnosis not present

## 2020-10-29 DIAGNOSIS — I2510 Atherosclerotic heart disease of native coronary artery without angina pectoris: Secondary | ICD-10-CM | POA: Diagnosis not present

## 2020-10-29 DIAGNOSIS — L98412 Non-pressure chronic ulcer of buttock with fat layer exposed: Secondary | ICD-10-CM | POA: Diagnosis not present

## 2020-10-29 DIAGNOSIS — E11622 Type 2 diabetes mellitus with other skin ulcer: Secondary | ICD-10-CM | POA: Diagnosis not present

## 2020-10-29 DIAGNOSIS — L0231 Cutaneous abscess of buttock: Secondary | ICD-10-CM | POA: Diagnosis not present

## 2020-10-29 LAB — CULTURE, BLOOD (ROUTINE X 2)
Culture: NO GROWTH
Culture: NO GROWTH
Special Requests: ADEQUATE
Special Requests: ADEQUATE

## 2020-10-30 NOTE — Progress Notes (Signed)
Jeremy, Wallace ([id number]) Visit Report for 10/29/2020 Arrival Information Details Patient Name: [name], [name]. Date of Service: 10/29/2020 12:30 PM Medical Record Number: [id number] Patient Account Number: [id number] Date of Birth/Sex: 1928-10-06 (87 y.o. M) Treating RN: Dolan Amen Primary Care Nayel Purdy: Ezequiel Kayser Other Clinician: Jeanine Luz Referring Odessia Asleson: Ezequiel Kayser Treating Aris Moman/Extender: Tito Dine in Treatment: 9 Visit Information History Since Last Visit Added or deleted any medications: No Patient Arrived: Gilford Rile Had a fall or experienced change in No Arrival Time: 12:55 activities of daily living that may affect Accompanied By: son risk of falls: Transfer Assistance: None Hospitalized since last visit: Yes Patient Identification Verified: Yes Pain Present Now: No Secondary Verification Process Completed: Yes Patient Requires Transmission-Based Precautions: No Patient Has Alerts: No Electronic Signature(s) Signed: 10/29/2020 3:13:24 PM By: Jeanine Luz Entered By: Jeanine Luz on 10/29/2020 12:56:06 Dolinar, Shelda Altes ([id number]) -------------------------------------------------------------------------------- Clinic Level of Care Assessment Details Patient Name: [name]. Date of Service: 10/29/2020 12:30 PM Medical Record Number: [id number] Patient Account Number: [id number] Date of Birth/Sex: [birth year] (87 y.o. M) Treating RN: Carlene Coria Primary Care Shourya Macpherson: Ezequiel Kayser Other Clinician: Jeanine Luz Referring Rozena Fierro: Ezequiel Kayser Treating Darcey Cardy/Extender: Tito Dine in Treatment: 9 Clinic Level of Care Assessment Items TOOL 4 Quantity Score X - Use when only an EandM is performed on FOLLOW-UP visit 1 0 ASSESSMENTS - Nursing Assessment / Reassessment []  - Reassessment of Co-morbidities (includes updates in patient status) 0 []  - 0 Reassessment of Adherence to Treatment  Plan ASSESSMENTS - Wound and Skin Assessment / Reassessment X - Simple Wound Assessment / Reassessment - one wound 1 5 []  - 0 Complex Wound Assessment / Reassessment - multiple wounds []  - 0 Dermatologic / Skin Assessment (not related to wound area) ASSESSMENTS - Focused Assessment []  - Circumferential Edema Measurements - multi extremities 0 []  - 0 Nutritional Assessment / Counseling / Intervention []  - 0 Lower Extremity Assessment (monofilament, tuning fork, pulses) []  - 0 Peripheral Arterial Disease Assessment (using hand held doppler) ASSESSMENTS - Ostomy and/or Continence Assessment and Care []  - Incontinence Assessment and Management 0 []  - 0 Ostomy Care Assessment and Management (repouching, etc.) PROCESS - Coordination of Care X - Simple Patient / Family Education for ongoing care 1 15 []  - 0 Complex (extensive) Patient / Family Education for ongoing care X- 1 10 Staff obtains Consents, Records, Test Results / Process Orders []  - 0 Staff telephones HHA, Nursing Homes / Clarify orders / etc []  - 0 Routine Transfer to another Facility (non-emergent condition) []  - 0 Routine Hospital Admission (non-emergent condition) []  - 0 New Admissions / Biomedical engineer / Ordering NPWT, Apligraf, etc. []  - 0 Emergency Hospital Admission (emergent condition) X- 1 10 Simple Discharge Coordination []  - 0 Complex (extensive) Discharge Coordination PROCESS - Special Needs []  - Pediatric / Minor Patient Management 0 []  - 0 Isolation Patient Management []  - 0 Hearing / Language / Visual special needs []  - 0 Assessment of Community assistance (transportation, D/C planning, etc.) []  - 0 Additional assistance / Altered mentation []  - 0 Support Surface(s) Assessment (bed, cushion, seat, etc.) INTERVENTIONS - Wound Cleansing / Measurement Schreurs, Augusten J. ([id number]) X- 1 5 Simple Wound Cleansing - one wound []  - 0 Complex Wound Cleansing - multiple wounds X- 1  5 Wound Imaging (photographs - any number of wounds) []  - 0 Wound Tracing (instead of photographs) X- 1 5 Simple Wound Measurement - one wound []  - 0 Complex Wound Measurement -  multiple wounds INTERVENTIONS - Wound Dressings []  - Small Wound Dressing one or multiple wounds 0 X- 1 15 Medium Wound Dressing one or multiple wounds []  - 0 Large Wound Dressing one or multiple wounds X- 1 5 Application of Medications - topical []  - 0 Application of Medications - injection INTERVENTIONS - Miscellaneous []  - External ear exam 0 []  - 0 Specimen Collection (cultures, biopsies, blood, body fluids, etc.) []  - 0 Specimen(s) / Culture(s) sent or taken to Lab for analysis []  - 0 Patient Transfer (multiple staff / Harrel Lemon Lift / Similar devices) []  - 0 Simple Staple / Suture removal (25 or less) []  - 0 Complex Staple / Suture removal (26 or more) []  - 0 Hypo / Hyperglycemic Management (close monitor of Blood Glucose) []  - 0 Ankle / Brachial Index (ABI) - do not check if billed separately X- 1 5 Vital Signs Has the patient been seen at the hospital within the last three years: Yes Total Score: 80 Level Of Care: New/Established - Level 3 Electronic Signature(s) Signed: 10/29/2020 5:33:58 PM By: Carlene Coria RN Entered By: Carlene Coria on 10/29/2020 13:17:14 Polak, Shelda Altes ([id number]) -------------------------------------------------------------------------------- Encounter Discharge Information Details Patient Name: Wallace, Jeremy. Date of Service: 10/29/2020 12:30 PM Medical Record Number: [id number] Patient Account Number: [id number] Date of Birth/Sex: Aug 27, 1928 (87 y.o. M) Treating RN: Donnamarie Poag Primary Care Luverna Degenhart: Ezequiel Kayser Other Clinician: Jeanine Luz Referring Jelisha Weed: Ezequiel Kayser Treating Vernia Teem/Extender: Tito Dine in Treatment: 9 Encounter Discharge Information Items Discharge Condition: Stable Ambulatory Status: Walker Discharge  Destination: Home Transportation: Private Auto Accompanied By: son Schedule Follow-up Appointment: Yes Clinical Summary of Care: Electronic Signature(s) Signed: 10/29/2020 4:40:42 PM By: Donnamarie Poag Entered By: Donnamarie Poag on 10/29/2020 13:30:30 Goel, Shelda Altes ([id number]) -------------------------------------------------------------------------------- Lower Extremity Assessment Details Patient Name: SEQUOYAH, COUNTERMAN. Date of Service: 10/29/2020 12:30 PM Medical Record Number: [id number] Patient Account Number: [id number] Date of Birth/Sex: 08/08/1928 (87 y.o. M) Treating RN: Dolan Amen Primary Care Brielynn Sekula: Ezequiel Kayser Other Clinician: Jeanine Luz Referring Lenice Koper: Ezequiel Kayser Treating Eriyana Sweeten/Extender: Tito Dine in Treatment: 9 Electronic Signature(s) Signed: 10/29/2020 3:13:24 PM By: Jeanine Luz Signed: 10/29/2020 4:45:18 PM By: [name], [name] RN Entered By: Jeanine Luz on 10/29/2020 12:59:09 HASON, OFARRELL ([id number]) -------------------------------------------------------------------------------- Multi Wound Chart Details Patient Name: DESHUN, SEDIVY. Date of Service: 10/29/2020 12:30 PM Medical Record Number: [id number] Patient Account Number: [id number] Date of Birth/Sex: 1929/06/27 (87 y.o. M) Treating RN: Carlene Coria Primary Care Nafisa Olds: Ezequiel Kayser Other Clinician: Jeanine Luz Referring Princessa Lesmeister: Ezequiel Kayser Treating Patria Warzecha/Extender: Tito Dine in Treatment: 9 Vital Signs Height(in): 70 Pulse(bpm): 60 Weight(lbs): 170 Blood Pressure(mmHg): 109/63 Body Mass Index(BMI): 24 Temperature(F): 97.8 Respiratory Rate(breaths/min): 18 Photos: [N/A:N/A] Wound Location: Left Gluteus N/A N/A Wounding Event: Gradually Appeared N/A N/A Primary Etiology: Abscess N/A N/A Date Acquired: 09/23/2020 N/A N/A Weeks of Treatment: 3 N/A N/A Wound Status: Open N/A N/A Measurements L x W x D (cm) 0.3x0.4x0.7  N/A N/A Area (cm) : 0.094 N/A N/A Volume (cm) : 0.066 N/A N/A % Reduction in Area: 65.80% N/A N/A % Reduction in Volume: -144.40% N/A N/A Starting Position 1 (o'clock): 11 Ending Position 1 (o'clock): 12 Maximum Distance 1 (cm): 0.3 Undermining: Yes N/A N/A Classification: Full Thickness Without Exposed N/A N/A Support Structures Exudate Amount: Small N/A N/A Exudate Type: Serous N/A N/A Exudate Color: amber N/A N/A Granulation Amount: Medium (34-66%) N/A N/A Granulation Quality: Red N/A N/A Necrotic Amount: None Present (0%) N/A N/A Exposed Structures:  Fat Layer (Subcutaneous Tissue): N/A N/A Yes Fascia: No Tendon: No Muscle: No Joint: No Bone: No Treatment Notes Electronic Signature(s) Signed: 10/29/2020 4:40:00 PM By: Linton Ham MD Entered By: Linton Ham on 10/29/2020 13:21:18 Bumbaugh, Shelda Altes ([id number]) -------------------------------------------------------------------------------- [place] Details Patient Name: DECKLYN, HYDER. Date of Service: 10/29/2020 12:30 PM Medical Record Number: [id number] Patient Account Number: [id number] Date of Birth/Sex: 1928-08-16 (87 y.o. M) Treating RN: Carlene Coria Primary Care Aylanie Cubillos: Ezequiel Kayser Other Clinician: Jeanine Luz Referring Jermesha Sottile: Ezequiel Kayser Treating Ann Bohne/Extender: Tito Dine in Treatment: 9 Active Inactive Wound/Skin Impairment Nursing Diagnoses: Impaired tissue integrity Goals: Patient/caregiver will verbalize understanding of skin care regimen Date Initiated: 08/26/2020 Date Inactivated: 09/09/2020 Target Resolution Date: 08/26/2020 Goal Status: Met Ulcer/skin breakdown will have a volume reduction of 30% by week 4 Date Initiated: 08/26/2020 Date Inactivated: 09/23/2020 Target Resolution Date: 09/23/2020 Goal Status: Met Ulcer/skin breakdown will have a volume reduction of 50% by week 8 Date Initiated: 08/26/2020 Date Inactivated: 10/29/2020 Target  Resolution Date: 10/24/2020 Goal Status: Met Ulcer/skin breakdown will have a volume reduction of 80% by week 12 Date Initiated: 08/26/2020 Target Resolution Date: 11/23/2020 Goal Status: Active Interventions: Assess patient/caregiver ability to obtain necessary supplies Assess patient/caregiver ability to perform ulcer/skin care regimen upon admission and as needed Assess ulceration(s) every visit Provide education on ulcer and skin care Treatment Activities: Skin care regimen initiated : 08/26/2020 Topical wound management initiated : 08/26/2020 Notes: Electronic Signature(s) Signed: 10/29/2020 5:33:58 PM By: Carlene Coria RN Entered By: Carlene Coria on 10/29/2020 13:16:14 [place], Shelda Altes ([id number]) -------------------------------------------------------------------------------- Pain Assessment Details Patient Name: LOCKE, BARRELL. Date of Service: 10/29/2020 12:30 PM Medical Record Number: [id number] Patient Account Number: [id number] Date of Birth/Sex: 1928-09-07 (87 y.o. M) Treating RN: Dolan Amen Primary Care Julian Medina: Ezequiel Kayser Other Clinician: Jeanine Luz Referring Charliene Inoue: Ezequiel Kayser Treating Daeveon Zweber/Extender: Tito Dine in Treatment: 9 Active Problems Location of Pain Severity and Description of Pain Patient Has Paino No Site Locations Rate the pain. Current Pain Level: 0 Pain Management and Medication Current Pain Management: Electronic Signature(s) Signed: 10/29/2020 3:13:24 PM By: Jeanine Luz Signed: 10/29/2020 4:45:18 PM By: [name], [name] RN Entered By: Jeanine Luz on 10/29/2020 12:58:53 Polus, Shelda Altes ([id number]) -------------------------------------------------------------------------------- Patient/Caregiver Education Details Patient Name: BRITTIN, JANIK. Date of Service: 10/29/2020 12:30 PM Medical Record Number: [id number] Patient Account Number: [id number] Date of Birth/Gender: [birth year] (87 y.o.  M) Treating RN: Carlene Coria Primary Care Physician: Ezequiel Kayser Other Clinician: Jeanine Luz Referring Physician: Ezequiel Kayser Treating Physician/Extender: Tito Dine in Treatment: 9 Education Assessment Education Provided To: Patient Education Topics Provided Wound/Skin Impairment: Methods: Explain/Verbal Responses: State content correctly Electronic Signature(s) Signed: 10/29/2020 5:33:58 PM By: Carlene Coria RN Entered By: Carlene Coria on 10/29/2020 13:17:30 [place], Shelda Altes ([id number]) -------------------------------------------------------------------------------- Wound Assessment Details Patient Name: TREVER, STREATER. Date of Service: 10/29/2020 12:30 PM Medical Record Number: [id number] Patient Account Number: [id number] Date of Birth/Sex: [birth year] (87 y.o. M) Treating RN: Dolan Amen Primary Care Bertran Zeimet: Ezequiel Kayser Other Clinician: Jeanine Luz Referring Donyea Gafford: Ezequiel Kayser Treating Karine Garn/Extender: Tito Dine in Treatment: 9 Wound Status Wound Number: 4 Primary Etiology: Abscess Wound Location: Left Gluteus Wound Status: Open Wounding Event: Gradually Appeared Date Acquired: 09/23/2020 Weeks Of Treatment: 3 Clustered Wound: No Photos Wound Measurements Length: (cm) 0.3 Width: (cm) 0.4 Depth: (cm) 0.7 Area: (cm) 0.094 Volume: (cm) 0.066 % Reduction in Area: 65.8% % Reduction in Volume: -144.4% Undermining: Yes Starting Position (o'clock): 11 Ending Position (  o'clock): 12 Maximum Distance: (cm) 0.3 Wound Description Classification: Full Thickness Without Exposed Support Structures Exudate Amount: Small Exudate Type: Serous Exudate Color: amber Foul Odor After Cleansing: No Slough/Fibrino No Wound Bed Granulation Amount: Medium (34-66%) Exposed Structure Granulation Quality: Red Fascia Exposed: No Necrotic Amount: None Present (0%) Fat Layer (Subcutaneous Tissue) Exposed: Yes Tendon Exposed:  No Muscle Exposed: No Joint Exposed: No Bone Exposed: No Treatment Notes Wound #4 (Gluteus) Wound Laterality: Left Cleanser Hibiclens, 16 (oz) Discharge Instruction: Put Hibiclenso on your skin head to toe and rub it in gently for five minutes with a washcloth. Turn the water back on and rinse very well with warm water. Do not use your regular soap after using and rinsing Hibiclenso. Pat yourself dry with a WIL, SLAPE. ([id number]) clean towel once a week Normal Saline Discharge Instruction: Wash your hands with soap and water. Remove old dressing, discard into plastic bag and place into trash. Cleanse the wound with Normal Saline prior to applying a clean dressing using gauze sponges, not tissues or cotton balls. Do not scrub or use excessive force. Pat dry using gauze sponges, not tissue or cotton balls. Peri-Wound Care Topical Primary Dressing Prisma 4.34 (in) Discharge Instruction: Moisten w/normal saline followed by plain packing strip . [place] Plain Packing Strips, Sterile 1x5 (in/yd) Discharge Instruction: Pack in as wick Secondary Dressing ABD Pad 5x9 (in/in) Discharge Instruction: Cover with ABD pad Secured With 37M [place] Surgical Tape, 2x2 (in/yd) Discharge Instruction: Secure dressing Compression Wrap Compression Stockings Add-Ons Electronic Signature(s) Signed: 10/29/2020 3:13:24 PM By: Jeanine Luz Signed: 10/29/2020 4:45:18 PM By: [name], [name] RN Entered By: Jeanine Luz on 10/29/2020 13:05:01 Strickler, Shelda Altes ([id number]) -------------------------------------------------------------------------------- Vitals Details Patient Name: NILAN, IDDINGS. Date of Service: 10/29/2020 12:30 PM Medical Record Number: [id number] Patient Account Number: [id number] Date of Birth/Sex: [birth year] (87 y.o. M) Treating RN: Dolan Amen Primary Care Jamilia Jacques: Ezequiel Kayser Other Clinician: Jeanine Luz Referring Julieanne Hadsall: Ezequiel Kayser Treating Branton Einstein/Extender: Tito Dine in Treatment: 9 Vital Signs Time Taken: 12:48 Temperature (F): 97.8 Height (in): 70 Pulse (bpm): 60 Weight (lbs): 170 Respiratory Rate (breaths/min): 18 Body Mass Index (BMI): 24.4 Blood Pressure (mmHg): 109/63 Reference Range: 80 - 120 mg / dl Electronic Signature(s) Signed: 10/29/2020 3:13:24 PM By: Jeanine Luz Entered By: Jeanine Luz on 10/29/2020 12:58:46

## 2020-10-30 NOTE — Progress Notes (Signed)
DAYRON, ODLAND ([id number]) Visit Report for 10/29/2020 HPI Details Patient Name: [name], [name]. Date of Service: 10/29/2020 12:30 PM Medical Record Number: [id number] Patient Account Number: [id number] Date of Birth/Sex: 10-15-1928 (87 y.o. M) Treating RN: Rogers Blocker Primary Care Provider: Mickey Farber Other Clinician: Lolita Cram Referring Provider: Mickey Farber Treating Provider/Extender: Altamese [place] in Treatment: 9 History of Present Illness HPI Description: 08/26/2020 upon evaluation today patient presents for initial inspection here in our clinic concerning issues he is been having with his gluteal region. He has several abscesses 2 on the right 1 on the left that have been present since at least December. With that being said triamcinolone and mupirocin has been used currently along with Desitin unfortunately this just does not seem to be making the progress that they would like to see. Obviously I think that the patient is not really showing any signs of infection right now which is good he was on doxycycline as prescribed by urgent care until he was seen in the ER in hospital where they apparently stopped this. Patient does have diabetes. He does also have a history of coronary artery disease. Subsequently his troponin was elevated in the hospital that is part of the reason why they kept him for analysis while he was there. Otherwise the patient seems to be doing well and I think he has a good chance of getting this to heal with appropriate treatment. 09/09/2020 upon evaluation today patient actually appears to be making good progress here with regard to his wounds. Fortunately there is no signs of active infection at this time. No fevers, chills, nausea, vomiting, or diarrhea. He has been tolerating the dressing changes without complication and his daughter-in-law is doing an excellent job taking care of this. 09/23/2020 upon evaluation today patient is making  good progress in regard to the wounds in the gluteal region. He has been tolerating the dressing changes without complication and very pleased that he seems to be doing so well. There does not appear to be any evidence of active infection which is great news and overall I think that the patient is making wonderful progress. 10/07/20 upon evaluation today patient appears to be doing excellent in regard to his right gluteal region. He has a healed wound on the distal gluteal region and proximally this is measuring much better. That is great news. Unfortunately he did have a new area open up on the left gluteal region this does appear to be an abscess and does not have to be cleaned out. He has been on doxycycline as prescribed by urgent care. 10/15/2020 upon evaluation today patient appears to be doing well with regard to his wounds in the gluteal region. I feel like he is actually making good progress here. Fortunately there does not appear any signs of active infection which is great news. No fevers, chills, nausea, vomiting, or diarrhea. 10/21/2020 upon evaluation today patient appears to be doing well with regard to his wounds. Is been tolerating the dressing changes without complication. The right side is healed the left side though not healed does appear to be doing well there is a little bit of debridement around the edges of the wound internally that do need to be performed. There is some slough here. 4/29; this is a patient who had 2 small wounds on his left buttock. These were apparently initially abscesses as defined during a trip to the ER or urgent care in [place] on 09/12/2020. 1 of these has actually healed.  They tell me that he spent 3 days in the hospital with pneumonia since the last time he was here. They labeled these pressure ulcers and did not specifically address them. In looking over the initial diagnosis I do not really see a picture. They describe these as abscess is hard and  indurated without any ability to express any pus. We are using silver collagen with backing plain packing gauze. The patient is offloading these as best he can Electronic Signature(s) Signed: 10/29/2020 4:40:00 PM By: Baltazar Najjar MD Entered By: Baltazar Najjar on 10/29/2020 13:25:06 Vesely, [name] ([id number]) -------------------------------------------------------------------------------- Physical Exam Details Patient Name: [name], [name]. Date of Service: 10/29/2020 12:30 PM Medical Record Number: [id number] Patient Account Number: [id number] Date of Birth/Sex: 16-Aug-1928 (87 y.o. M) Treating RN: Rogers Blocker Primary Care Provider: Mickey Farber Other Clinician: Lolita Cram Referring Provider: Mickey Farber Treating Provider/Extender: Altamese [place] in Treatment: 9 Constitutional Sitting or standing Blood Pressure is within target range for patient.. Pulse regular and within target range for patient.Marland Kitchen Respirations regular, non- labored and within target range.. Temperature is normal and within the target range for the patient.Marland Kitchen appears in no distress. Notes Wound exam showed evidence of decent granulation at the base although it is difficult to see the extent of this. He probably has 0.4 cm of circumferential undermining. There is no firm subcutaneous involvement no tenderness no purulence. He was put on nystatin a couple of weeks ago because of surrounding erythema although I do not think we need to use that currently. All of that is resolved Electronic Signature(s) Signed: 10/29/2020 4:40:00 PM By: Baltazar Najjar MD Entered By: Baltazar Najjar on 10/29/2020 13:26:27 KIRSTEN, MCKONE ([id number]) -------------------------------------------------------------------------------- Physician Orders Details Patient Name: [name], [name]. Date of Service: 10/29/2020 12:30 PM Medical Record Number: [id number] Patient Account Number: [id number] Date of Birth/Sex:  1929-02-09 (87 y.o. M) Treating RN: Yevonne Pax Primary Care Provider: Mickey Farber Other Clinician: Lolita Cram Referring Provider: Mickey Farber Treating Provider/Extender: Altamese [place] in Treatment: 9 Verbal / Phone Orders: No Diagnosis Coding Follow-up Appointments o Return Appointment in 1 week. Off-Loading o Turn and reposition every 2 hours Medications-Please add to medication list. o Other: - Continue applying Mupirocin intranasal and under finger nails weekly for next 4 weeks Wound Treatment Wound #4 - Gluteus Wound Laterality: Left Cleanser: Hibiclens, 16 (oz) 1 x Per Day/30 Days Discharge Instructions: Put Hibiclenso on your skin head to toe and rub it in gently for five minutes with a washcloth. Turn the water back on and rinse very well with warm water. Do not use your regular soap after using and rinsing Hibiclenso. Pat yourself dry with a clean towel once a week Cleanser: Normal Saline 1 x Per Day/30 Days Discharge Instructions: Wash your hands with soap and water. Remove old dressing, discard into plastic bag and place into trash. Cleanse the wound with Normal Saline prior to applying a clean dressing using gauze sponges, not tissues or cotton balls. Do not scrub or use excessive force. Pat dry using gauze sponges, not tissue or cotton balls. Primary Dressing: Prisma 4.34 (in) (Generic) 1 x Per Day/30 Days Discharge Instructions: Moisten w/normal saline followed by plain packing strip . Primary Dressing: Curad Plain Packing Strips, Sterile 1x5 (in/yd) 1 x Per Day/30 Days Discharge Instructions: Pack in as wick Secondary Dressing: ABD Pad 5x9 (in/in) (Generic) 1 x Per Day/30 Days Discharge Instructions: Cover with ABD pad Secured With: 71M Medipore H Soft Cloth Surgical Tape, 2x2 (in/yd) (Generic)  1 x Per Day/30 Days Discharge Instructions: Secure dressing Electronic Signature(s) Signed: 10/29/2020 4:40:00 PM By: Baltazar Najjar MD Signed: 10/29/2020  5:33:58 PM By: Yevonne Pax RN Entered By: Yevonne Pax on 10/29/2020 13:21:17 [name], [name] ([id number]) -------------------------------------------------------------------------------- Problem List Details Patient Name: [name], [name]. Date of Service: 10/29/2020 12:30 PM Medical Record Number: [id number] Patient Account Number: [id number] Date of Birth/Sex: 04-13-1929 (87 y.o. M) Treating RN: Rogers Blocker Primary Care Provider: Mickey Farber Other Clinician: Lolita Cram Referring Provider: Mickey Farber Treating Provider/Extender: Altamese [place] in Treatment: 9 Active Problems ICD-10 Encounter Code Description Active Date MDM Diagnosis L02.31 Cutaneous abscess of buttock 08/26/2020 No Yes L98.412 Non-pressure chronic ulcer of buttock with fat layer exposed 08/26/2020 No Yes E11.622 Type 2 diabetes mellitus with other skin ulcer 08/26/2020 No Yes I25.10 Atherosclerotic heart disease of native coronary artery without angina 08/26/2020 No Yes pectoris I50.42 Chronic combined systolic (congestive) and diastolic (congestive) heart 08/26/2020 No Yes failure Inactive Problems Resolved Problems Electronic Signature(s) Signed: 10/29/2020 4:40:00 PM By: Baltazar Najjar MD Entered By: Baltazar Najjar on 10/29/2020 13:21:05 Baumgarten, [name] ([id number]) -------------------------------------------------------------------------------- Progress Note Details Patient Name: [name], [name]. Date of Service: 10/29/2020 12:30 PM Medical Record Number: [id number] Patient Account Number: [id number] Date of Birth/Sex: Dec 12, 1928 (87 y.o. M) Treating RN: Rogers Blocker Primary Care Provider: Mickey Farber Other Clinician: Lolita Cram Referring Provider: Mickey Farber Treating Provider/Extender: Altamese [place] in Treatment: 9 Subjective History of Present Illness (HPI) 08/26/2020 upon evaluation today patient presents for initial inspection here in our clinic  concerning issues he is been having with his gluteal region. He has several abscesses 2 on the right 1 on the left that have been present since at least December. With that being said triamcinolone and mupirocin has been used currently along with Desitin unfortunately this just does not seem to be making the progress that they would like to see. Obviously I think that the patient is not really showing any signs of infection right now which is good he was on doxycycline as prescribed by urgent care until he was seen in the ER in hospital where they apparently stopped this. Patient does have diabetes. He does also have a history of coronary artery disease. Subsequently his troponin was elevated in the hospital that is part of the reason why they kept him for analysis while he was there. Otherwise the patient seems to be doing well and I think he has a good chance of getting this to heal with appropriate treatment. 09/09/2020 upon evaluation today patient actually appears to be making good progress here with regard to his wounds. Fortunately there is no signs of active infection at this time. No fevers, chills, nausea, vomiting, or diarrhea. He has been tolerating the dressing changes without complication and his daughter-in-law is doing an excellent job taking care of this. 09/23/2020 upon evaluation today patient is making good progress in regard to the wounds in the gluteal region. He has been tolerating the dressing changes without complication and very pleased that he seems to be doing so well. There does not appear to be any evidence of active infection which is great news and overall I think that the patient is making wonderful progress. 10/07/20 upon evaluation today patient appears to be doing excellent in regard to his right gluteal region. He has a healed wound on the distal gluteal region and proximally this is measuring much better. That is great news. Unfortunately he did have a new area open up  on the left gluteal region this does appear to be an abscess and does not have to be cleaned out. He has been on doxycycline as prescribed by urgent care. 10/15/2020 upon evaluation today patient appears to be doing well with regard to his wounds in the gluteal region. I feel like he is actually making good progress here. Fortunately there does not appear any signs of active infection which is great news. No fevers, chills, nausea, vomiting, or diarrhea. 10/21/2020 upon evaluation today patient appears to be doing well with regard to his wounds. Is been tolerating the dressing changes without complication. The right side is healed the left side though not healed does appear to be doing well there is a little bit of debridement around the edges of the wound internally that do need to be performed. There is some slough here. 4/29; this is a patient who had 2 small wounds on his left buttock. These were apparently initially abscesses as defined during a trip to the ER or urgent care in [place] on 09/12/2020. 1 of these has actually healed. They tell me that he spent 3 days in the hospital with pneumonia since the last time he was here. They labeled these pressure ulcers and did not specifically address them. In looking over the initial diagnosis I do not really see a picture. They describe these as abscess is hard and indurated without any ability to express any pus. We are using silver collagen with backing plain packing gauze. The patient is offloading these as best he can Objective Constitutional Sitting or standing Blood Pressure is within target range for patient.. Pulse regular and within target range for patient.Marland Kitchen Respirations regular, non- labored and within target range.. Temperature is normal and within the target range for the patient.Marland Kitchen appears in no distress. Vitals Time Taken: 12:48 PM, Height: 70 in, Weight: 170 lbs, BMI: 24.4, Temperature: 97.8 F, Pulse: 60 bpm, Respiratory Rate: 18  breaths/min, Blood Pressure: 109/63 mmHg. General Notes: Wound exam showed evidence of decent granulation at the base although it is difficult to see the extent of this. He probably has 0.4 cm of circumferential undermining. There is no firm subcutaneous involvement no tenderness no purulence. He was put on nystatin a couple of weeks ago because of surrounding erythema although I do not think we need to use that currently. All of that is resolved Integumentary (Hair, Skin) Wound #4 status is Open. Original cause of wound was Gradually Appeared. The date acquired was: 09/23/2020. The wound has been in treatment 3 weeks. The wound is located on the Left Gluteus. The wound measures 0.3cm length x 0.4cm width x 0.7cm depth; 0.094cm^2 area and 0.066cm^3 volume. There is Fat Layer (Subcutaneous Tissue) exposed. There is undermining starting at 11:00 and ending at 12:00 with a maximum distance of 0.3cm. There is a small amount of serous drainage noted. There is medium (34-66%) red granulation within the wound bed. There is no necrotic tissue within the wound bed. 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 Off-Loading: Turn and reposition every 2 hours Medications-Please add to medication list.: Other: - Continue applying Mupirocin intranasal and under finger nails weekly for next 4 weeks WOUND #4: - Gluteus Wound Laterality: Left Cleanser:  Hibiclens, 16 (oz) 1 x Per Day/30 Days Discharge Instructions: Put Hibiclens on your skin head to toe and rub it in gently for five minutes with a washcloth. Turn the water back on  and rinse very well with warm water. Do not use your regular soap after using and rinsing Hibiclens. Pat yourself dry with a clean towel once a week Cleanser: Normal Saline 1 x Per Day/30 Days Discharge Instructions: Wash your hands with soap and water. Remove old dressing, discard into plastic bag and place into trash. Cleanse the wound with Normal Saline prior to applying a clean dressing using gauze sponges, not tissues or cotton balls. Do not scrub or use excessive force. Pat dry using gauze sponges, not tissue or cotton balls. Primary Dressing: Prisma 4.34 (in) (Generic) 1 x Per Day/30 Days Discharge Instructions: Moisten w/normal saline followed by plain packing strip . Primary Dressing: Curad Plain Packing Strips, Sterile 1x5 (in/yd) 1 x Per Day/30 Days Discharge Instructions: Pack in as wick Secondary Dressing: ABD Pad 5x9 (in/in) (Generic) 1 x Per Day/30 Days Discharge Instructions: Cover with ABD pad Secured With: 51M Medipore H Soft Cloth Surgical Tape, 2x2 (in/yd) (Generic) 1 x Per Day/30 Days Discharge Instructions: Secure dressing 1. I continued the same dressing which is basically silver collagen 2. If this area does not fill-in it is possible that the surrounding skin may need to be removed so we can better dressed the entirety of the wound area. I discussed that with him. There is no current evidence of infection Electronic Signature(s) Signed: 10/29/2020 4:40:00 PM By: Baltazar Najjarobson, Cristela Stalder MD Entered By: Baltazar Najjarobson, Jahfari Ambers on 10/29/2020 13:27:12 Conley, Jeremy CatholicALBERT J. ([id number]) -------------------------------------------------------------------------------- SuperBill Details Patient Name: [name], Jeremy J. Date of Service: 10/29/2020 Medical Record Number: [id number] Patient Account Number: [id number] Date of Birth/Sex: 09/10/1928 (87 y.o. M) Treating RN: Yevonne PaxEpps, Carrie Primary Care Provider: Mickey Farberhies, David Other Clinician: Lolita CramBurnette, Kyara Referring Provider: Mickey Farberhies, David Treating  Provider/Extender: Altamese CarolinaOBSON, Henya Aguallo G Weeks in Treatment: 9 Diagnosis Coding ICD-10 Codes Code Description L02.31 Cutaneous abscess of buttock L98.412 Non-pressure chronic ulcer of buttock with fat layer exposed E11.622 Type 2 diabetes mellitus with other skin ulcer I25.10 Atherosclerotic heart disease of native coronary artery without angina pectoris I50.42 Chronic combined systolic (congestive) and diastolic (congestive) heart failure Facility Procedures CPT4 Code: [id number] Description: [id number] - WOUND CARE VISIT-LEV 3 EST PT Modifier: Quantity: 1 Electronic Signature(s) Signed: 10/29/2020 4:40:00 PM By: Baltazar Najjarobson, Grabiel Schmutz MD Signed: 10/29/2020 5:33:58 PM By: Yevonne PaxEpps, Carrie RN Entered By: Yevonne PaxEpps, Carrie on 10/29/2020 13:17:19

## 2020-11-08 ENCOUNTER — Other Ambulatory Visit: Payer: Self-pay

## 2020-11-08 ENCOUNTER — Encounter: Payer: Medicare Other | Attending: Physician Assistant | Admitting: Physician Assistant

## 2020-11-08 DIAGNOSIS — L0231 Cutaneous abscess of buttock: Secondary | ICD-10-CM | POA: Insufficient documentation

## 2020-11-08 DIAGNOSIS — I2510 Atherosclerotic heart disease of native coronary artery without angina pectoris: Secondary | ICD-10-CM | POA: Insufficient documentation

## 2020-11-08 DIAGNOSIS — L98412 Non-pressure chronic ulcer of buttock with fat layer exposed: Secondary | ICD-10-CM | POA: Insufficient documentation

## 2020-11-08 DIAGNOSIS — I5042 Chronic combined systolic (congestive) and diastolic (congestive) heart failure: Secondary | ICD-10-CM | POA: Insufficient documentation

## 2020-11-08 DIAGNOSIS — E11622 Type 2 diabetes mellitus with other skin ulcer: Secondary | ICD-10-CM | POA: Insufficient documentation

## 2020-11-08 NOTE — Progress Notes (Addendum)
Jeremy, Wallace ([id number]) Visit Report for 11/08/2020 Chief Complaint Document Details Patient Name: [name], [name]. Date of Service: 11/08/2020 9:45 AM Medical Record Number: [id number] Patient Account Number: [id number] Date of Birth/Sex: March 02, 1929 (87 y.o. M) Treating RN: Hansel Feinstein Primary Care Provider: Mickey Farber Other Clinician: Referring Provider: Mickey Farber Treating Provider/Extender: Rowan Blase in Treatment: 10 Information Obtained from: Patient Chief Complaint Bilateral gluteal abscesses Electronic Signature(s) Signed: 11/08/2020 10:28:39 AM By: Lenda Kelp PA-C Entered By: Lenda Kelp on 11/08/2020 10:28:38 SHAKA, CARDIN ([id number]) -------------------------------------------------------------------------------- HPI Details Patient Name: Jeremy, Wallace. Date of Service: 11/08/2020 9:45 AM Medical Record Number: [id number] Patient Account Number: [id number] Date of Birth/Sex: 11-28-1928 (87 y.o. M) Treating RN: Hansel Feinstein Primary Care Provider: Mickey Farber Other Clinician: Referring Provider: Mickey Farber Treating Provider/Extender: Rowan Blase in Treatment: 10 History of Present Illness HPI Description: 08/26/2020 upon evaluation today patient presents for initial inspection here in our clinic concerning issues he is been having with his gluteal region. He has several abscesses 2 on the right 1 on the left that have been present since at least December. With that being said triamcinolone and mupirocin has been used currently along with Desitin unfortunately this just does not seem to be making the progress that they would like to see. Obviously I think that the patient is not really showing any signs of infection right now which is good he was on doxycycline as prescribed by urgent care until he was seen in the ER in hospital where they apparently stopped this. Patient does have diabetes. He does also have a history of coronary artery  disease. Subsequently his troponin was elevated in the hospital that is part of the reason why they kept him for analysis while he was there. Otherwise the patient seems to be doing well and I think he has a good chance of getting this to heal with appropriate treatment. 09/09/2020 upon evaluation today patient actually appears to be making good progress here with regard to his wounds. Fortunately there is no signs of active infection at this time. No fevers, chills, nausea, vomiting, or diarrhea. He has been tolerating the dressing changes without complication and his daughter-in-law is doing an excellent job taking care of this. 09/23/2020 upon evaluation today patient is making good progress in regard to the wounds in the gluteal region. He has been tolerating the dressing changes without complication and very pleased that he seems to be doing so well. There does not appear to be any evidence of active infection which is great news and overall I think that the patient is making wonderful progress. 10/07/20 upon evaluation today patient appears to be doing excellent in regard to his right gluteal region. He has a healed wound on the distal gluteal region and proximally this is measuring much better. That is great news. Unfortunately he did have a new area open up on the left gluteal region this does appear to be an abscess and does not have to be cleaned out. He has been on doxycycline as prescribed by urgent care. 10/15/2020 upon evaluation today patient appears to be doing well with regard to his wounds in the gluteal region. I feel like he is actually making good progress here. Fortunately there does not appear any signs of active infection which is great news. No fevers, chills, nausea, vomiting, or diarrhea. 10/21/2020 upon evaluation today patient appears to be doing well with regard to his wounds. Is been tolerating the dressing changes without  complication. The right side is healed the left side  though not healed does appear to be doing well there is a little bit of debridement around the edges of the wound internally that do need to be performed. There is some slough here. 4/29; this is a patient who had 2 small wounds on his left buttock. These were apparently initially abscesses as defined during a trip to the ER or urgent care in [place] on 09/12/2020. 1 of these has actually healed. They tell me that he spent 3 days in the hospital with pneumonia since the last time he was here. They labeled these pressure ulcers and did not specifically address them. In looking over the initial diagnosis I do not really see a picture. They describe these as abscess is hard and indurated without any ability to express any pus. We are using silver collagen with backing plain packing gauze. The patient is offloading these as best he can. 11/08/2020 upon evaluation today patient appears to be doing well with regard to his wound currently. In fact this is showing signs of excellent improvement. I do not see any signs of infection whatsoever. In fact there is really not having any wound for any packing gauze at this point. They are just doing the collagen which I think is absolutely appropriate. Electronic Signature(s) Signed: 11/08/2020 10:38:04 AM By: Lenda Kelp PA-C Previous Signature: 11/08/2020 10:29:48 AM Version By: Lenda Kelp PA-C Previous Signature: 11/08/2020 10:28:53 AM Version By: Lenda Kelp PA-C Entered By: Lenda Kelp on 11/08/2020 10:38:04 JEYREN, DANOWSKI ([id number]) -------------------------------------------------------------------------------- Physical Exam Details Patient Name: Jeremy, Wallace. Date of Service: 11/08/2020 9:45 AM Medical Record Number: [id number] Patient Account Number: [id number] Date of Birth/Sex: [birth year] (87 y.o. M) Treating RN: Hansel Feinstein Primary Care Provider: Mickey Farber Other Clinician: Referring Provider: Mickey Farber Treating  Provider/Extender: Rowan Blase in Treatment: 10 Constitutional Well-nourished and well-hydrated in no acute distress. Respiratory normal breathing without difficulty. Psychiatric this patient is able to make decisions and demonstrates good insight into disease process. Alert and Oriented x 3. pleasant and cooperative. Notes Patient's wound bed showed signs of good granulation epithelization at this point. I think he is making excellent progress he just has 1 area of left on the left gluteal region and even this is significantly smaller compared to last time. We will packing this with silver collagen nothing more is needed such as a packing strip at this point. Electronic Signature(s) Signed: 11/08/2020 10:38:23 AM By: Lenda Kelp PA-C Previous Signature: 11/08/2020 10:30:31 AM Version By: Lenda Kelp PA-C Previous Signature: 11/08/2020 10:29:12 AM Version By: Lenda Kelp PA-C Entered By: Lenda Kelp on 11/08/2020 10:38:23 YAHSIR, WICKENS ([id number]) -------------------------------------------------------------------------------- Physician Orders Details Patient Name: GARMON, DEHN. Date of Service: 11/08/2020 9:45 AM Medical Record Number: [id number] Patient Account Number: [id number] Date of Birth/Sex: 11/21/1928 (87 y.o. M) Treating RN: Hansel Feinstein Primary Care Provider: Mickey Farber Other Clinician: Referring Provider: Mickey Farber Treating Provider/Extender: Rowan Blase in Treatment: 10 Verbal / Phone Orders: No Diagnosis Coding ICD-10 Coding Code Description L02.31 Cutaneous abscess of buttock L98.412 Non-pressure chronic ulcer of buttock with fat layer exposed E11.622 Type 2 diabetes mellitus with other skin ulcer I25.10 Atherosclerotic heart disease of native coronary artery without angina pectoris I50.42 Chronic combined systolic (congestive) and diastolic (congestive) heart failure Follow-up Appointments o Return Appointment in 3  weeks. Off-Loading o Turn and reposition every 2 hours Medications-Please add to medication list. o Other: -  Continue applying Mupirocin intranasal and under finger nails weekly for next 4 weeks Wound Treatment Wound #4 - Gluteus Wound Laterality: Left Cleanser: Hibiclens, 16 (oz) 1 x Per Day/30 Days Discharge Instructions: Put Hibiclenso on your skin head to toe and rub it in gently for five minutes with a washcloth. Turn the water back on and rinse very well with warm water. Do not use your regular soap after using and rinsing Hibiclenso. Pat yourself dry with a clean towel once a week Cleanser: Normal Saline 1 x Per Day/30 Days Discharge Instructions: Wash your hands with soap and water. Remove old dressing, discard into plastic bag and place into trash. Cleanse the wound with Normal Saline prior to applying a clean dressing using gauze sponges, not tissues or cotton balls. Do not scrub or use excessive force. Pat dry using gauze sponges, not tissue or cotton balls. Primary Dressing: Gauze 1 x Per Day/30 Days Discharge Instructions: As directed: dry, moistened with saline or moistened with Dakins Solution Primary Dressing: Prisma 4.34 (in) (Generic) 1 x Per Day/30 Days Discharge Instructions: Moisten w/normal saline followed by plain packing strip . Secured With: 69M Medipore H Soft Cloth Surgical Tape, 2x2 (in/yd) (Generic) 1 x Per Day/30 Days Discharge Instructions: Secure dressing Electronic Signature(s) Signed: 11/08/2020 5:00:28 PM By: Hansel Feinstein Signed: 11/08/2020 6:51:50 PM By: Lenda Kelp PA-C Entered By: Hansel Feinstein on 11/08/2020 10:38:44 Rueb, [name] ([id number]) -------------------------------------------------------------------------------- Problem List Details Patient Name: CALLAN, NORDEN. Date of Service: 11/08/2020 9:45 AM Medical Record Number: [id number] Patient Account Number: [id number] Date of Birth/Sex: 03/24/1929 (87 y.o. M) Treating RN: Hansel Feinstein Primary Care Provider: Mickey Farber Other Clinician: Referring Provider: Mickey Farber Treating Provider/Extender: Rowan Blase in Treatment: 10 Active Problems ICD-10 Encounter Code Description Active Date MDM Diagnosis L02.31 Cutaneous abscess of buttock 08/26/2020 No Yes L98.412 Non-pressure chronic ulcer of buttock with fat layer exposed 08/26/2020 No Yes E11.622 Type 2 diabetes mellitus with other skin ulcer 08/26/2020 No Yes I25.10 Atherosclerotic heart disease of native coronary artery without angina 08/26/2020 No Yes pectoris I50.42 Chronic combined systolic (congestive) and diastolic (congestive) heart 08/26/2020 No Yes failure Inactive Problems Resolved Problems Electronic Signature(s) Signed: 11/08/2020 10:28:32 AM By: Lenda Kelp PA-C Entered By: Lenda Kelp on 11/08/2020 10:28:31 Ghee, [name] ([id number]) -------------------------------------------------------------------------------- Progress Note Details Patient Name: Norberta Keens. Date of Service: 11/08/2020 9:45 AM Medical Record Number: [id number] Patient Account Number: [id number] Date of Birth/Sex: [birth year] (87 y.o. M) Treating RN: Hansel Feinstein Primary Care Provider: Mickey Farber Other Clinician: Referring Provider: Mickey Farber Treating Provider/Extender: Rowan Blase in Treatment: 10 Subjective Chief Complaint Information obtained from Patient Bilateral gluteal abscesses History of Present Illness (HPI) 08/26/2020 upon evaluation today patient presents for initial inspection here in our clinic concerning issues he is been having with his gluteal region. He has several abscesses 2 on the right 1 on the left that have been present since at least December. With that being said triamcinolone and mupirocin has been used currently along with Desitin unfortunately this just does not seem to be making the progress that they would like to see. Obviously I think that the patient is not really  showing any signs of infection right now which is good he was on doxycycline as prescribed by urgent care until he was seen in the ER in hospital where they apparently stopped this. Patient does have diabetes. He does also have a history of coronary artery disease. Subsequently his troponin was elevated in the  hospital that is part of the reason why they kept him for analysis while he was there. Otherwise the patient seems to be doing well and I think he has a good chance of getting this to heal with appropriate treatment. 09/09/2020 upon evaluation today patient actually appears to be making good progress here with regard to his wounds. Fortunately there is no signs of active infection at this time. No fevers, chills, nausea, vomiting, or diarrhea. He has been tolerating the dressing changes without complication and his daughter-in-law is doing an excellent job taking care of this. 09/23/2020 upon evaluation today patient is making good progress in regard to the wounds in the gluteal region. He has been tolerating the dressing changes without complication and very pleased that he seems to be doing so well. There does not appear to be any evidence of active infection which is great news and overall I think that the patient is making wonderful progress. 10/07/20 upon evaluation today patient appears to be doing excellent in regard to his right gluteal region. He has a healed wound on the distal gluteal region and proximally this is measuring much better. That is great news. Unfortunately he did have a new area open up on the left gluteal region this does appear to be an abscess and does not have to be cleaned out. He has been on doxycycline as prescribed by urgent care. 10/15/2020 upon evaluation today patient appears to be doing well with regard to his wounds in the gluteal region. I feel like he is actually making good progress here. Fortunately there does not appear any signs of active infection which is  great news. No fevers, chills, nausea, vomiting, or diarrhea. 10/21/2020 upon evaluation today patient appears to be doing well with regard to his wounds. Is been tolerating the dressing changes without complication. The right side is healed the left side though not healed does appear to be doing well there is a little bit of debridement around the edges of the wound internally that do need to be performed. There is some slough here. 4/29; this is a patient who had 2 small wounds on his left buttock. These were apparently initially abscesses as defined during a trip to the ER or urgent care in [place] on 09/12/2020. 1 of these has actually healed. They tell me that he spent 3 days in the hospital with pneumonia since the last time he was here. They labeled these pressure ulcers and did not specifically address them. In looking over the initial diagnosis I do not really see a picture. They describe these as abscess is hard and indurated without any ability to express any pus. We are using silver collagen with backing plain packing gauze. The patient is offloading these as best he can. 11/08/2020 upon evaluation today patient appears to be doing well with regard to his wound currently. In fact this is showing signs of excellent improvement. I do not see any signs of infection whatsoever. In fact there is really not having any wound for any packing gauze at this point. They are just doing the collagen which I think is absolutely appropriate. Objective Constitutional Well-nourished and well-hydrated in no acute distress. Vitals Time Taken: 10:10 AM, Height: 70 in, Weight: 170 lbs, BMI: 24.4, Temperature: 97.7 F, Pulse: 67 bpm, Respiratory Rate: 18 breaths/min, Blood Pressure: 94/55 mmHg, Pulse Oximetry: 94 %. Respiratory normal breathing without difficulty. Psychiatric Norberta KeensBRADFORD, Damaree J. ([id number]) this patient is able to make decisions and demonstrates good insight into disease process.  Alert and  Oriented x 3. pleasant and cooperative. General Notes: Patient's wound bed showed signs of good granulation epithelization at this point. I think he is making excellent progress he just has 1 area of left on the left gluteal region and even this is significantly smaller compared to last time. We will packing this with silver collagen nothing more is needed such as a packing strip at this point. Integumentary (Hair, Skin) Wound #4 status is Open. Original cause of wound was Gradually Appeared. The date acquired was: 09/23/2020. The wound has been in treatment 4 weeks. The wound is located on the Left Gluteus. The wound measures 0.2cm length x 0.2cm width x 0.3cm depth; 0.031cm^2 area and 0.009cm^3 volume. There is Fat Layer (Subcutaneous Tissue) exposed. There is no tunneling or undermining noted. There is a small amount of serous drainage noted. There is no granulation within the wound bed. There is no necrotic tissue within the wound bed. 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 Off-Loading: Turn and reposition every 2 hours Medications-Please add to medication list.: Other: - Continue applying Mupirocin intranasal and under finger nails weekly for next 4 weeks WOUND #4: - Gluteus Wound Laterality: Left Cleanser: Hibiclens, 16 (oz) 1 x Per Day/30 Days Discharge Instructions: Put Hibiclens on your skin head to toe and rub it in gently for five minutes with a washcloth. Turn the water back on and rinse very well with warm water. Do not use your regular soap after using and rinsing Hibiclens. Pat yourself dry with a clean towel once a  week Cleanser: Normal Saline 1 x Per Day/30 Days Discharge Instructions: Wash your hands with soap and water. Remove old dressing, discard into plastic bag and place into trash. Cleanse the wound with Normal Saline prior to applying a clean dressing using gauze sponges, not tissues or cotton balls. Do not scrub or use excessive force. Pat dry using gauze sponges, not tissue or cotton balls. Primary Dressing: Gauze 1 x Per Day/30 Days Discharge Instructions: As directed: dry, moistened with saline or moistened with Dakins Solution Primary Dressing: Prisma 4.34 (in) (Generic) 1 x Per Day/30 Days Discharge Instructions: Moisten w/normal saline followed by plain packing strip . Secured With: 72M Medipore H Soft Cloth Surgical Tape, 2x2 (in/yd) (Generic) 1 x Per Day/30 Days Discharge Instructions: Secure dressing 1. Would recommend that we continue with the silver collagen I think that is appropriate. 2. Also can recommend covering this with either a Telfa island dressing or border foam dressing either one of the 2 is absolutely appropriate. 3. I am also can recommend the patient continue to monitor for any signs of worsening infection. Obviously if he develops any issues he should let us know as soon as possible but in the meantime of note plan to just see him in a few weeks. We will see patient back for reevaluation in 3 weeks here in the clinic. If anything worsens or changes patient will contact our office for additional recommendations. Electronic Signature(s) Signed: 11/08/2020 10:39:54 AM By: Lenda Kelp PA-C Entered By: Lenda Kelp on 11/08/2020 10:39:54 Moor, [name] ([id number]) -------------------------------------------------------------------------------- SuperBill Details Patient Name: TANER, RZEPKA. Date of Service: 11/08/2020 Medical Record Number: [id number] Patient Account Number: [id number] Date of  Birth/Sex: 1928/09/12 (87 y.o. M) Treating RN: Hansel Feinstein Primary  Care Provider: Mickey Farber Other Clinician: Referring Provider: Mickey Farber Treating Provider/Extender: Rowan Blase in Treatment: 10 Diagnosis Coding ICD-10 Codes Code Description L02.31 Cutaneous abscess of buttock L98.412 Non-pressure chronic ulcer of buttock with fat layer exposed E11.622 Type 2 diabetes mellitus with other skin ulcer I25.10 Atherosclerotic heart disease of native coronary artery without angina pectoris I50.42 Chronic combined systolic (congestive) and diastolic (congestive) heart failure Physician Procedures CPT4 Code: [id number] Description: 99213 - WC PHYS LEVEL 3 - EST PT Modifier: Quantity: 1 CPT4 Code: Description: ICD-10 Diagnosis Description L02.31 Cutaneous abscess of buttock L98.412 Non-pressure chronic ulcer of buttock with fat layer exposed E11.622 Type 2 diabetes mellitus with other skin ulcer I25.10 Atherosclerotic heart disease of native  coronary artery without angina Modifier: pectoris Quantity: Electronic Signature(s) Signed: 11/08/2020 10:40:05 AM By: Lenda Kelp PA-C Entered By: Lenda Kelp on 11/08/2020 10:40:04

## 2020-11-08 NOTE — Progress Notes (Signed)
ANDROS, CHANNING ([id number]) Visit Report for 11/08/2020 Arrival Information Details Patient Name: [name], [name]. Date of Service: 11/08/2020 9:45 AM Medical Record Number: [id number] Patient Account Number: [id number] Date of Birth/Sex: [birth year] (87 y.o. M) Treating RN: Dolan Amen Primary Care Ramello Cordial: Ezequiel Kayser Other Clinician: Referring Wendelin Reader: Ezequiel Kayser Treating Zeshan Sena/Extender: Skipper Cliche in Treatment: 10 Visit Information History Since Last Visit Pain Present Now: No Patient Arrived: Walker Arrival Time: 10:09 Accompanied By: daughter in law Transfer Assistance: None Patient Identification Verified: Yes Secondary Verification Process Completed: Yes Patient Requires Transmission-Based Precautions: No Patient Has Alerts: No Electronic Signature(s) Signed: 11/08/2020 11:53:54 AM By: [name], [name] RN Entered By: [name], [name] on 11/08/2020 10:10:06 [name] ([id number]) -------------------------------------------------------------------------------- Encounter Discharge Information Details Patient Name: [name], [name]. Date of Service: 11/08/2020 9:45 AM Medical Record Number: [id number] Patient Account Number: [id number] Date of Birth/Sex: 01-05-1929 (87 y.o. M) Treating RN: Dolan Amen Primary Care Ayline Dingus: Ezequiel Kayser Other Clinician: Referring Khasir Woodrome: Ezequiel Kayser Treating Ivan Maskell/Extender: Skipper Cliche in Treatment: 10 Encounter Discharge Information Items Discharge Condition: Stable Ambulatory Status: Walker Discharge Destination: Home Transportation: Private Auto Accompanied By: daughter in law Schedule Follow-up Appointment: Yes Clinical Summary of Care: Electronic Signature(s) Signed: 11/08/2020 11:53:54 AM By: [name], [name] RN Entered By: [name], [name] on 11/08/2020 10:45:07 [name]  ([id number]) -------------------------------------------------------------------------------- Lower Extremity Assessment Details Patient Name: [name], [name]. Date of Service: 11/08/2020 9:45 AM Medical Record Number: [id number] Patient Account Number: [id number] Date of Birth/Sex: Jan 16, 1929 (87 y.o. M) Treating RN: Dolan Amen Primary Care Orlanda Frankum: Ezequiel Kayser Other Clinician: Referring Kiaraliz Rafuse: Ezequiel Kayser Treating Suhan Paci/Extender: Jeri Cos Weeks in Treatment: 10 Electronic Signature(s) Signed: 11/08/2020 11:53:54 AM By: [name], [name] RN Entered By: [name], [name] on 11/08/2020 10:20:04 [name] ([id number]) -------------------------------------------------------------------------------- Multi Wound Chart Details Patient Name: [name], [name]. Date of Service: 11/08/2020 9:45 AM Medical Record Number: [id number] Patient Account Number: [id number] Date of Birth/Sex: Oct 29, 1928 (87 y.o. M) Treating RN: Donnamarie Poag Primary Care Afomia Blackley: Ezequiel Kayser Other Clinician: Referring Remedy Corporan: Ezequiel Kayser Treating Ocie Tino/Extender: Skipper Cliche in Treatment: 10 Vital Signs Height(in): 70 Pulse(bpm): 28 Weight(lbs): 170 Blood Pressure(mmHg): 94/55 Body Mass Index(BMI): 24 Temperature(F): 97.7 Respiratory Rate(breaths/min): 18 Photos: [N/A:N/A] Wound Location: Left Gluteus N/A N/A Wounding Event: Gradually Appeared N/A N/A Primary Etiology: Abscess N/A N/A Date Acquired: 09/23/2020 N/A N/A Weeks of Treatment: 4 N/A N/A Wound Status: Open N/A N/A Measurements L x W x D (cm) 0.2x0.2x0.3 N/A N/A Area (cm) : 0.031 N/A N/A Volume (cm) : 0.009 N/A N/A % Reduction in Area: 88.70% N/A N/A % Reduction in Volume: 66.70% N/A N/A Classification: Full Thickness Without Exposed N/A N/A Support Structures Exudate Amount: Small N/A N/A Exudate Type: Serous N/A N/A Exudate Color: amber N/A N/A Granulation Amount: None Present (0%) N/A N/A Necrotic  Amount: None Present (0%) N/A N/A Exposed Structures: Fat Layer (Subcutaneous Tissue): N/A N/A Yes Fascia: No Tendon: No Muscle: No Joint: No Bone: No Epithelialization: None N/A N/A Treatment Notes Electronic Signature(s) Signed: 11/08/2020 5:00:28 PM By: Donnamarie Poag Entered By: Donnamarie Poag on 11/08/2020 10:35:14 [name], [name] ([id number]) -------------------------------------------------------------------------------- [place] Details Patient Name: [name], [name]. Date of Service: 11/08/2020 9:45 AM Medical Record Number: [id number] Patient Account Number: [id number] Date of Birth/Sex: [birth year] (87 y.o. M) Treating RN: Donnamarie Poag Primary Care Johntavious Francom: Ezequiel Kayser Other Clinician: Referring Mccartney Brucks: Ezequiel Kayser Treating Letecia Arps/Extender: Skipper Cliche in Treatment: 10 Active Inactive Wound/Skin Impairment Nursing Diagnoses: Impaired tissue  integrity Goals: Patient/caregiver will verbalize understanding of skin care regimen Date Initiated: 08/26/2020 Date Inactivated: 09/09/2020 Target Resolution Date: 08/26/2020 Goal Status: Met Ulcer/skin breakdown will have a volume reduction of 30% by week 4 Date Initiated: 08/26/2020 Date Inactivated: 09/23/2020 Target Resolution Date: 09/23/2020 Goal Status: Met Ulcer/skin breakdown will have a volume reduction of 50% by week 8 Date Initiated: 08/26/2020 Date Inactivated: 10/29/2020 Target Resolution Date: 10/24/2020 Goal Status: Met Ulcer/skin breakdown will have a volume reduction of 80% by week 12 Date Initiated: 08/26/2020 Target Resolution Date: 11/23/2020 Goal Status: Active Interventions: Assess patient/caregiver ability to obtain necessary supplies Assess patient/caregiver ability to perform ulcer/skin care regimen upon admission and as needed Assess ulceration(s) every visit Provide education on ulcer and skin care Treatment Activities: Skin care regimen initiated : 08/26/2020 Topical wound  management initiated : 08/26/2020 Notes: Electronic Signature(s) Signed: 11/08/2020 5:00:28 PM By: Donnamarie Poag Entered By: Donnamarie Poag on 11/08/2020 10:34:35 [name], [name] ([id number]) -------------------------------------------------------------------------------- Pain Assessment Details Patient Name: [name]. Date of Service: 11/08/2020 9:45 AM Medical Record Number: [id number] Patient Account Number: [id number] Date of Birth/Sex: [birth year] (87 y.o. M) Treating RN: Dolan Amen Primary Care Gatlyn Lipari: Ezequiel Kayser Other Clinician: Referring Graelyn Bihl: Ezequiel Kayser Treating Angelie Kram/Extender: Skipper Cliche in Treatment: 10 Active Problems Location of Pain Severity and Description of Pain Patient Has Paino No Site Locations Rate the pain. Current Pain Level: 0 Pain Management and Medication Current Pain Management: Electronic Signature(s) Signed: 11/08/2020 11:53:54 AM By: [name], [name] RN Entered By: [name], [name] on 11/08/2020 10:13:54 [name], [name] ([id number]) -------------------------------------------------------------------------------- Patient/Caregiver Education Details Patient Name: [name], [name]. Date of Service: 11/08/2020 9:45 AM Medical Record Number: [id number] Patient Account Number: [id number] Date of Birth/Gender: 1928/09/12 (87 y.o. M) Treating RN: Donnamarie Poag Primary Care Physician: Ezequiel Kayser Other Clinician: Referring Physician: Ezequiel Kayser Treating Physician/Extender: Skipper Cliche in Treatment: 10 Education Assessment Education Provided To: Patient and Caregiver Education Topics Provided Basic Hygiene: Wound/Skin Impairment: Electronic Signature(s) Signed: 11/08/2020 5:00:28 PM By: Donnamarie Poag Entered By: Donnamarie Poag on 11/08/2020 10:35:41 [name], [name] ([id number]) -------------------------------------------------------------------------------- Wound Assessment Details Patient Name: [name], [name]. Date of Service: 11/08/2020 9:45 AM Medical Record Number: [id number] Patient Account Number: [id number] Date of Birth/Sex: 1928/12/19 (87 y.o. M) Treating RN: Dolan Amen Primary Care Finnean Cerami: Ezequiel Kayser Other Clinician: Referring Rhett Najera: Ezequiel Kayser Treating Chrissy Ealey/Extender: Skipper Cliche in Treatment: 10 Wound Status Wound Number: 4 Primary Etiology: Abscess Wound Location: Left Gluteus Wound Status: Open Wounding Event: Gradually Appeared Date Acquired: 09/23/2020 Weeks Of Treatment: 4 Clustered Wound: No Photos Wound Measurements Length: (cm) 0.2 Width: (cm) 0.2 Depth: (cm) 0.3 Area: (cm) 0.031 Volume: (cm) 0.009 % Reduction in Area: 88.7% % Reduction in Volume: 66.7% Epithelialization: None Tunneling: No Undermining: No Wound Description Classification: Full Thickness Without Exposed Support Structures Exudate Amount: Small Exudate Type: Serous Exudate Color: amber Foul Odor After Cleansing: No Slough/Fibrino No Wound Bed Granulation Amount: None Present (0%) Exposed Structure Necrotic Amount: None Present (0%) Fascia Exposed: No Fat Layer (Subcutaneous Tissue) Exposed: Yes Tendon Exposed: No Muscle Exposed: No Joint Exposed: No Bone Exposed: No Treatment Notes Wound #4 (Gluteus) Wound Laterality: Left Cleanser Hibiclens, 16 (oz) Discharge Instruction: Put Hibiclenso on your skin head to toe and rub it in gently for five minutes with a washcloth. Turn the water back on and rinse very well with warm water. Do not use your regular soap after using and rinsing Hibiclenso. Pat yourself dry with a clean towel once a week  Normal [place] ([id number]) Discharge Instruction: Wash your hands with soap and water. Remove old dressing, discard into plastic bag and place into trash. Cleanse the wound with Normal Saline prior to applying a clean dressing using gauze sponges, not tissues or cotton balls. Do not scrub or use  excessive force. Pat dry using gauze sponges, not tissue or cotton balls. Peri-Wound Care Topical Primary Dressing Gauze Discharge Instruction: As directed: dry, moistened with saline or moistened with Dakins Solution Prisma 4.34 (in) Discharge Instruction: Moisten w/normal saline followed by plain packing strip . Secondary Dressing Secured With 74M Medipore H Soft Cloth Surgical Tape, 2x2 (in/yd) Discharge Instruction: Secure dressing Compression Wrap Compression Stockings Add-Ons Electronic Signature(s) Signed: 11/08/2020 11:53:54 AM By: [name], [name] RN Entered By: [name], [name] on 11/08/2020 10:19:51 [place], [name] ([id number]) -------------------------------------------------------------------------------- Vitals Details Patient Name: [name], [name]. Date of Service: 11/08/2020 9:45 AM Medical Record Number: [id number] Patient Account Number: [id number] Date of Birth/Sex: 1929/01/05 (87 y.o. M) Treating RN: Dolan Amen Primary Care Shantale Holtmeyer: Ezequiel Kayser Other Clinician: Referring Ryver Poblete: Ezequiel Kayser Treating Windie Marasco/Extender: Skipper Cliche in Treatment: 10 Vital Signs Time Taken: 10:10 Temperature (F): 97.7 Height (in): 70 Pulse (bpm): 67 Weight (lbs): 170 Respiratory Rate (breaths/min): 18 Body Mass Index (BMI): 24.4 Blood Pressure (mmHg): 94/55 Reference Range: 80 - 120 mg / dl Airway Pulse Oximetry (%): 94 Electronic Signature(s) Signed: 11/08/2020 11:53:54 AM By: [name], [name] RN Entered By: [name], [name] on 11/08/2020 10:13:36

## 2020-11-30 ENCOUNTER — Encounter: Payer: Medicare Other | Admitting: Physician Assistant

## 2020-11-30 ENCOUNTER — Other Ambulatory Visit: Payer: Self-pay

## 2020-11-30 DIAGNOSIS — L98412 Non-pressure chronic ulcer of buttock with fat layer exposed: Secondary | ICD-10-CM | POA: Diagnosis not present

## 2020-11-30 DIAGNOSIS — L0231 Cutaneous abscess of buttock: Secondary | ICD-10-CM | POA: Diagnosis not present

## 2020-11-30 DIAGNOSIS — I5042 Chronic combined systolic (congestive) and diastolic (congestive) heart failure: Secondary | ICD-10-CM | POA: Diagnosis not present

## 2020-11-30 DIAGNOSIS — I2510 Atherosclerotic heart disease of native coronary artery without angina pectoris: Secondary | ICD-10-CM | POA: Diagnosis not present

## 2020-11-30 DIAGNOSIS — E11622 Type 2 diabetes mellitus with other skin ulcer: Secondary | ICD-10-CM | POA: Diagnosis present

## 2020-11-30 NOTE — Progress Notes (Signed)
[name] ([id number]) Visit Report for 11/30/2020 Arrival Information Details Patient Name: [name], [name]. Date of Service: 11/30/2020 10:45 AM Medical Record Number: [id number] Patient Account Number: [id number] Date of Birth/Sex: 1928-10-31 (87 y.o. M) Treating RN: [name] Primary Care [name]: [name] Other Clinician: Referring [name]: [name] Treating [name]/Extender: [name] in Treatment: 13 Visit Information History Since Last Visit Added or deleted any medications: No Patient Arrived: [name] Had a fall or experienced change in No Arrival Time: 11:12 activities of daily living that may affect Accompanied By: son risk of falls: Transfer Assistance: None Hospitalized since last visit: No Patient Identification Verified: Yes Has Dressing in Place as Prescribed: Yes Secondary Verification Process Completed: Yes Pain Present Now: No Patient Requires Transmission-Based No Precautions: Patient Has Alerts: Yes Patient Alerts: Patient on Blood Thinner ASPIRIN DIABETIC Electronic Signature(s) Signed: 11/30/2020 1:46:03 PM By: [name] Entered By: [name] on 11/30/2020 11:13:25 [name], [name] ([id number]) -------------------------------------------------------------------------------- Clinic Level of Care Assessment Details Patient Name: [name], [name]. Date of Service: 11/30/2020 10:45 AM Medical Record Number: [id number] Patient Account Number: [id number] Date of Birth/Sex: 1928/08/23 (87 y.o. M) Treating RN: [name] Primary Care [name]: [name] Other Clinician: Referring [name]: [name] Treating [name]/Extender: [name] in Treatment: 13 Clinic Level of Care Assessment Items TOOL 4 Quantity Score X - Use when only an EandM is performed on FOLLOW-UP visit 1 0 ASSESSMENTS - Nursing Assessment / Reassessment X - Reassessment of Co-morbidities (includes updates in patient status) 1 10 X- 1  5 Reassessment of Adherence to Treatment Plan ASSESSMENTS - Wound and Skin Assessment / Reassessment []  - Simple Wound Assessment / Reassessment - one wound 0 []  - 0 Complex Wound Assessment / Reassessment - multiple wounds []  - 0 Dermatologic / Skin Assessment (not related to wound area) ASSESSMENTS - Focused Assessment []  - Circumferential Edema Measurements - multi extremities 0 []  - 0 Nutritional Assessment / Counseling / Intervention []  - 0 Lower Extremity Assessment (monofilament, tuning fork, pulses) []  - 0 Peripheral Arterial Disease Assessment (using hand held doppler) ASSESSMENTS - Ostomy and/or Continence Assessment and Care []  - Incontinence Assessment and Management 0 []  - 0 Ostomy Care Assessment and Management (repouching, etc.) PROCESS - Coordination of Care X - Simple Patient / Family Education for ongoing care 1 15 []  - 0 Complex (extensive) Patient / Family Education for ongoing care []  - 0 Staff obtains , Records, Test Results / Process Orders []  - 0 Staff telephones HHA, Nursing Homes / Clarify orders / etc []  - 0 Routine Transfer to another Facility (non-emergent condition) []  - 0 Routine Hospital Admission (non-emergent condition) []  - 0 New Admissions / / Ordering NPWT, Apligraf, etc. []  - 0 Emergency Hospital Admission (emergent condition) X- 1 10 Simple Discharge Coordination []  - 0 Complex (extensive) Discharge Coordination PROCESS - Special Needs []  - Pediatric / Minor Patient Management 0 []  - 0 Isolation Patient Management []  - 0 Hearing / Language / Visual special needs []  - 0 Assessment of Community assistance (transportation, D/C planning, etc.) []  - 0 Additional assistance / Altered mentation []  - 0 Support Surface(s) Assessment (bed, cushion, seat, etc.) INTERVENTIONS - Wound Cleansing / Measurement [name], [name]. ( ) []  - 0 Simple Wound Cleansing - one wound []  - 0 Complex Wound  Cleansing - multiple wounds []  - 0 Wound Imaging (photographs - any number of wounds) []  - 0 Wound Tracing (instead of photographs) []  - 0 Simple Wound Measurement -  one wound []  - 0 Complex Wound Measurement - multiple wounds INTERVENTIONS - Wound Dressings []  - Small Wound Dressing one or multiple wounds 0 []  - 0 Medium Wound Dressing one or multiple wounds []  - 0 Large Wound Dressing one or multiple wounds []  - 0 Application of Medications - topical []  - 0 Application of Medications - injection INTERVENTIONS - Miscellaneous []  - External ear exam 0 []  - 0 Specimen Collection (cultures, biopsies, blood, body fluids, etc.) []  - 0 Specimen(s) / Culture(s) sent or taken to Lab for analysis []  - 0 Patient Transfer (multiple staff / / Similar devices) []  - 0 Simple Staple / Suture removal (25 or less) []  - 0 Complex Staple / Suture removal (26 or more) []  - 0 Hypo / Hyperglycemic Management (close monitor of Blood Glucose) []  - 0 Ankle / Brachial Index (ABI) - do not check if billed separately X- 1 5 Vital Signs Has the patient been seen at the hospital within the last three years: Yes Total Score: 45 Level Of Care: New/Established - Level 2 Electronic Signature(s) Signed: 11/30/2020 4:43:03 PM By: , RN Entered By: , on 11/30/2020 11:55:41 [name], ( ) -------------------------------------------------------------------------------- Encounter Discharge Information Details Patient Name: [name], [name]. Date of Service: 11/30/2020 10:45 AM Medical Record Number: Patient Account Number: Date of Birth/Sex: Apr 14, 1929 (87 y.o. M) Treating RN: 12/02/2020 Primary Care [name]: [name] Other Clinician: Referring Evren Shankland: [name] Treating [name]/Extender: [name] in Treatment: 13 Encounter Discharge Information Items Discharge Condition:  Stable Ambulatory Status: Walker Discharge Destination: Home Transportation: Private Auto Accompanied By: son Schedule Follow-up Appointment: No Clinical Summary of Care: Electronic Signature(s) Signed: 11/30/2020 4:43:03 PM By: [name], [id number] RN Entered By: Norberta Keens, 12/02/2020 on 11/30/2020 11:56:39 Drees, [id number] (10/04/1928) -------------------------------------------------------------------------------- Lower Extremity Assessment Details Patient Name: [name], [name]. Date of Service: 11/30/2020 10:45 AM Medical Record Number: [name] Patient Account Number: [name] Date of Birth/Sex: 01/04/1929 (87 y.o. M) Treating RN: 12/02/2020 Primary Care Haruto Demaria: [name] Other Clinician: Referring Josafat Enrico: [name] Treating Mertie Haslem/Extender: [name] in Treatment: 13 Electronic Signature(s) Signed: 11/30/2020 1:46:03 PM By: 12/02/2020 Entered By: [name] on 11/30/2020 11:18:11 Cryer, Norberta Keens (12/02/2020) -------------------------------------------------------------------------------- Multi Wound Chart Details Patient Name: [name], [name]. Date of Service: 11/30/2020 10:45 AM Medical Record Number: 10/04/1928 Patient Account Number: [id number] Date of Birth/Sex: 03/21/1929 (87 y.o. M) Treating RN: [name] Primary Care Doreen Garretson: [name] Other Clinician: Referring Tennille Montelongo: 12/02/2020 Treating Evertt Chouinard/Extender: [name] in Treatment: 13 Vital Signs Height(in): 70 Pulse(bpm): 59 Weight(lbs): 170 Blood Pressure(mmHg): 100/53 Body Mass Index(BMI): 24 Temperature(F): 98.2 Respiratory Rate(breaths/min): 18 Photos: [N/A:N/A] Wound Location: Left Gluteus N/A N/A Wounding Event: Gradually Appeared N/A N/A Primary Etiology: Abscess N/A N/A Date Acquired: 09/23/2020 N/A N/A Weeks of Treatment: 7 N/A N/A Wound Status: Healed - Epithelialized N/A N/A Measurements L x W x D (cm) 0x0x0 N/A N/A Area (cm) : 0 N/A  N/A Volume (cm) : 0 N/A N/A % Reduction in Area: 100.00% N/A N/A % Reduction in Volume: 100.00% N/A N/A Classification: Full Thickness Without Exposed N/A N/A Support Structures Exudate Amount: None Present N/A N/A Granulation Amount: None Present (0%) N/A N/A Necrotic Amount: None Present (0%) N/A N/A Exposed Structures: Fascia: No N/A N/A Fat Layer (Subcutaneous Tissue): No Tendon: No Muscle: No Joint: No Bone: No Epithelialization: Large (67-100%) N/A N/A Treatment Notes Electronic Signature(s) Signed: 11/30/2020 4:43:03 PM By: [name], [id number]  RN Entered By: [name], [name] on 11/30/2020 11:55:06 Sanjuan, [name] ([id number]) -------------------------------------------------------------------------------- Multi-Disciplinary Care Plan Details Patient Name: [name], [name]. Date of Service: 11/30/2020 10:45 AM Medical Record Number: [id number] Patient Account Number: [id number] Date of Birth/Sex: June 03, 1929 (87 y.o. M) Treating RN: [name] Primary Care Lessly Stigler: [name] Other Clinician: Referring Kina Shiffman: [name] Treating Ebin Palazzi/Extender: [name] in Treatment: 13 Active Inactive Electronic Signature(s) Signed: 11/30/2020 4:43:03 PM By: [name], Jeremy Prader RN Entered By: [name], [name] on 11/30/2020 11:54:58 Norberta Keens ([id number]) -------------------------------------------------------------------------------- Pain Assessment Details Patient Name: [name], [name]. Date of Service: 11/30/2020 10:45 AM Medical Record Number: [id number] Patient Account Number: [id number] Date of Birth/Sex: [birth year] (87 y.o. M) Treating RN: [name] Primary Care Sarin Comunale: [name] Other Clinician: Referring Ravon Mcilhenny: [name] Treating Clovia Reine/Extender: [name] in Treatment: 13 Active Problems Location of Pain Severity and Description of Pain Patient Has Paino No Site Locations Rate the  pain. Current Pain Level: 0 Pain Management and Medication Current Pain Management: Electronic Signature(s) Signed: 11/30/2020 1:46:03 PM By: [name] Entered By: [name] on 11/30/2020 11:14:52 Cirillo, [name] ([id number]) -------------------------------------------------------------------------------- Patient/Caregiver Education Details Patient Name: [name], [name]. Date of Service: 11/30/2020 10:45 AM Medical Record Number: [id number] Patient Account Number: [id number] Date of Birth/Gender: 27-Apr-1929 (87 y.o. M) Treating RN: [name] Primary Care Physician: [name] Other Clinician: Referring Physician: Mickey Wallace Treating Physician/Extender: [name] in Treatment: 13 Education Assessment Education Provided To: Patient Education Topics Provided Wound/Skin Impairment: Methods: Explain/Verbal Responses: State content correctly Electronic Signature(s) Signed: 11/30/2020 4:43:03 PM By: [name], Jeremy Prader RN Entered By: [name], [name] on 11/30/2020 11:55:55 Anastacio, [name] ([id number]) -------------------------------------------------------------------------------- Wound Assessment Details Patient Name: [name], [name]. Date of Service: 11/30/2020 10:45 AM Medical Record Number: [id number] Patient Account Number: [id number] Date of Birth/Sex: 01-24-1929 (87 y.o. M) Treating RN: [name] Primary Care Cheyrl Buley: [name] Other Clinician: Referring Mardell Cragg: [name] Treating Nicki Gracy/Extender: [name] in Treatment: 13 Wound Status Wound Number: 4 Primary Etiology: Abscess Wound Location: Left Gluteus Wound Status: Healed - Epithelialized Wounding Event: Gradually Appeared Date Acquired: 09/23/2020 Weeks Of Treatment: 7 Clustered Wound: No Photos Wound Measurements Length: (cm) 0 Width: (cm) 0 Depth: (cm) 0 Area: (cm) 0 Volume: (cm) 0 % Reduction in Area: 100% % Reduction in Volume:  100% Epithelialization: Large (67-100%) Tunneling: No Undermining: No Wound Description Classification: Full Thickness Without Exposed Support Structure Exudate Amount: None Present s Foul Odor After Cleansing: No Slough/Fibrino No Wound Bed Granulation Amount: None Present (0%) Exposed Structure Necrotic Amount: None Present (0%) Fascia Exposed: No Fat Layer (Subcutaneous Tissue) Exposed: No Tendon Exposed: No Muscle Exposed: No Joint Exposed: No Bone Exposed: No Electronic Signature(s) Signed: 11/30/2020 1:46:03 PM By: [name] Signed: 11/30/2020 4:43:03 PM By: [name], Jeremy Prader RN Entered By: [name], [name] on 11/30/2020 11:54:49 Rotan, [name] ([id number]) -------------------------------------------------------------------------------- Vitals Details Patient Name: [name], [name]. Date of Service: 11/30/2020 10:45 AM Medical Record Number: [id number] Patient Account Number: [id number] Date of Birth/Sex: [birth year] (87 y.o. M) Treating RN: [name] Primary Care Kamran Coker: [name] Other Clinician: Referring Alicianna Litchford: [name] Treating Zosia Lucchese/Extender: [name] in Treatment: 13 Vital Signs Time Taken: 11:12 Temperature (F): 98.2 Height (in): 70 Pulse (bpm): 59 Weight (lbs): 170 Respiratory Rate (breaths/min): 18 Body Mass Index (BMI): 24.4 Blood Pressure (mmHg): 100/53 Reference Range: 80 - 120 mg / dl Electronic Signature(s) Signed: 11/30/2020 1:46:03 PM By: [name] Entered ByHansel Wallace on 11/30/2020  11:13:50 

## 2020-11-30 NOTE — Progress Notes (Addendum)
[name], Alyn J. ([id number]) Visit Report for 11/30/2020 Chief Complaint Document Details Patient Name: [name], Triston J. Date of Service: 11/30/2020 10:45 AM Medical Record Number: [id number] Patient Account Number: [id number] Date of Birth/Sex: 05/31/1929 (87 y.o. M) Treating RN: Rogers BlockerSanchez, Kenia Primary Care Provider: Mickey Farberhies, David Other Clinician: Referring Provider: Mickey Farberhies, David Treating Provider/Extender: Rowan BlaseStone, Obediah Welles Weeks in Treatment: 13 Information Obtained from: Patient Chief Complaint Bilateral gluteal abscesses Electronic Signature(s) Signed: 11/30/2020 11:53:11 AM By: Lenda KelpStone III, Darleny Sem PA-C Entered By: Lenda KelpStone III, Anacleto Batterman on 11/30/2020 11:53:11 Spruiell, Malena CatholicALBERT J. ([id number]) -------------------------------------------------------------------------------- HPI Details Patient Name: [name], Jeremy J. Date of Service: 11/30/2020 10:45 AM Medical Record Number: [id number] Patient Account Number: [id number] Date of Birth/Sex: 09/20/1928 (87 y.o. M) Treating RN: Rogers BlockerSanchez, Kenia Primary Care Provider: Mickey Farberhies, David Other Clinician: Referring Provider: Mickey Farberhies, David Treating Provider/Extender: Rowan BlaseStone, Reginold Beale Weeks in Treatment: 13 History of Present Illness HPI Description: 08/26/2020 upon evaluation today patient presents for initial inspection here in our clinic concerning issues he is been having with his gluteal region. He has several abscesses 2 on the right 1 on the left that have been present since at least December. With that being said triamcinolone and mupirocin has been used currently along with Desitin unfortunately this just does not seem to be making the progress that they would like to see. Obviously I think that the patient is not really showing any signs of infection right now which is good he was on doxycycline as prescribed by urgent care until he was seen in the ER in hospital where they apparently stopped this. Patient does have diabetes. He does also have a history of  coronary artery disease. Subsequently his troponin was elevated in the hospital that is part of the reason why they kept him for analysis while he was there. Otherwise the patient seems to be doing well and I think he has a good chance of getting this to heal with appropriate treatment. 09/09/2020 upon evaluation today patient actually appears to be making good progress here with regard to his wounds. Fortunately there is no signs of active infection at this time. No fevers, chills, nausea, vomiting, or diarrhea. He has been tolerating the dressing changes without complication and his daughter-in-law is doing an excellent job taking care of this. 09/23/2020 upon evaluation today patient is making good progress in regard to the wounds in the gluteal region. He has been tolerating the dressing changes without complication and very pleased that he seems to be doing so well. There does not appear to be any evidence of active infection which is great news and overall I think that the patient is making wonderful progress. 10/07/20 upon evaluation today patient appears to be doing excellent in regard to his right gluteal region. He has a healed wound on the distal gluteal region and proximally this is measuring much better. That is great news. Unfortunately he did have a new area open up on the left gluteal region this does appear to be an abscess and does not have to be cleaned out. He has been on doxycycline as prescribed by urgent care. 10/15/2020 upon evaluation today patient appears to be doing well with regard to his wounds in the gluteal region. I feel like he is actually making good progress here. Fortunately there does not appear any signs of active infection which is great news. No fevers, chills, nausea, vomiting, or diarrhea. 10/21/2020 upon evaluation today patient appears to be doing well with regard to his wounds. Is been tolerating the dressing changes without  complication. The right side is  healed the left side though not healed does appear to be doing well there is a little bit of debridement around the edges of the wound internally that do need to be performed. There is some slough here. 4/29; this is a patient who had 2 small wounds on his left buttock. These were apparently initially abscesses as defined during a trip to the ER or urgent care in [place] on 09/12/2020. 1 of these has actually healed. They tell me that he spent 3 days in the hospital with pneumonia since the last time he was here. They labeled these pressure ulcers and did not specifically address them. In looking over the initial diagnosis I do not really see a picture. They describe these as abscess is hard and indurated without any ability to express any pus. We are using silver collagen with backing plain packing gauze. The patient is offloading these as best he can. 11/08/2020 upon evaluation today patient appears to be doing well with regard to his wound currently. In fact this is showing signs of excellent improvement. I do not see any signs of infection whatsoever. In fact there is really not having any wound for any packing gauze at this point. They are just doing the collagen which I think is absolutely appropriate. 11/30/2020 upon evaluation today patient appears to be doing excellent in regard to his wounds on the gluteal region. In fact he appears to be completely healed. Electronic Signature(s) Signed: 11/30/2020 5:37:42 PM By: Lenda Kelp PA-C Entered By: Lenda Kelp on 11/30/2020 17:37:42 Baglio, [name] ([id number]) -------------------------------------------------------------------------------- Physical Exam Details Patient Name: [name]. Date of Service: 11/30/2020 10:45 AM Medical Record Number: [id number] Patient Account Number: [id number] Date of Birth/Sex: February 22, 1929 (87 y.o. M) Treating RN: Rogers Blocker Primary Care Provider: Mickey Farber Other Clinician: Referring  Provider: Mickey Farber Treating Provider/Extender: Rowan Blase in Treatment: 13 Constitutional Well-nourished and well-hydrated in no acute distress. Respiratory normal breathing without difficulty. Psychiatric this patient is able to make decisions and demonstrates good insight into disease process. Alert and Oriented x 3. pleasant and cooperative. Notes Upon inspection patient's wound bed showed signs of complete epithelization. I do not see any signs of infection right now which is great news and overall he is doing extremely well and again I think he is ready for discharge. Electronic Signature(s) Signed: 11/30/2020 5:38:09 PM By: Lenda Kelp PA-C Entered By: Lenda Kelp on 11/30/2020 17:38:09 Cuthbertson, [name] ([id number]) -------------------------------------------------------------------------------- Physician Orders Details Patient Name: BROADY, LAFOY. Date of Service: 11/30/2020 10:45 AM Medical Record Number: [id number] Patient Account Number: [id number] Date of Birth/Sex: 19-May-1929 (87 y.o. M) Treating RN: Rogers Blocker Primary Care Provider: Mickey Farber Other Clinician: Referring Provider: Mickey Farber Treating Provider/Extender: Rowan Blase in Treatment: 13 Verbal / Phone Orders: No Diagnosis Coding ICD-10 Coding Code Description L02.31 Cutaneous abscess of buttock L98.412 Non-pressure chronic ulcer of buttock with fat layer exposed E11.622 Type 2 diabetes mellitus with other skin ulcer I25.10 Atherosclerotic heart disease of native coronary artery without angina pectoris I50.42 Chronic combined systolic (congestive) and diastolic (congestive) heart failure Discharge From [facility] o Discharge from Wound Care Center Treatment Complete Electronic Signature(s) Signed: 11/30/2020 4:43:03 PM By: Lajean Manes RN Signed: 11/30/2020 5:49:10 PM By: Lenda Kelp PA-C Entered By: Phillis Haggis, Dondra Prader on 11/30/2020  11:55:25 Roam, [name] ([id number]) -------------------------------------------------------------------------------- Problem List Details Patient Name: ABRAM, SAX. Date of Service: 11/30/2020 10:45 AM Medical Record  Number: [id number] Patient Account Number: [id number] Date of Birth/Sex: 08-30-1928 (87 y.o. M) Treating RN: Rogers Blocker Primary Care Provider: Mickey Farber Other Clinician: Referring Provider: Mickey Farber Treating Provider/Extender: Rowan Blase in Treatment: 13 Active Problems ICD-10 Encounter Code Description Active Date MDM Diagnosis L02.31 Cutaneous abscess of buttock 08/26/2020 No Yes L98.412 Non-pressure chronic ulcer of buttock with fat layer exposed 08/26/2020 No Yes E11.622 Type 2 diabetes mellitus with other skin ulcer 08/26/2020 No Yes I25.10 Atherosclerotic heart disease of native coronary artery without angina 08/26/2020 No Yes pectoris I50.42 Chronic combined systolic (congestive) and diastolic (congestive) heart 08/26/2020 No Yes failure Inactive Problems Resolved Problems Electronic Signature(s) Signed: 11/30/2020 11:53:05 AM By: Lenda Kelp PA-C Entered By: Lenda Kelp on 11/30/2020 11:53:03 Rahrig, [name] ([id number]) -------------------------------------------------------------------------------- Progress Note Details Patient Name: Jeremy Keens. Date of Service: 11/30/2020 10:45 AM Medical Record Number: [id number] Patient Account Number: [id number] Date of Birth/Sex: 1928-10-03 (87 y.o. M) Treating RN: Rogers Blocker Primary Care Provider: Mickey Farber Other Clinician: Referring Provider: Mickey Farber Treating Provider/Extender: Rowan Blase in Treatment: 13 Subjective Chief Complaint Information obtained from Patient Bilateral gluteal abscesses History of Present Illness (HPI) 08/26/2020 upon evaluation today patient presents for initial inspection here in our clinic concerning issues he is been having with  his gluteal region. He has several abscesses 2 on the right 1 on the left that have been present since at least December. With that being said triamcinolone and mupirocin has been used currently along with Desitin unfortunately this just does not seem to be making the progress that they would like to see. Obviously I think that the patient is not really showing any signs of infection right now which is good he was on doxycycline as prescribed by urgent care until he was seen in the ER in hospital where they apparently stopped this. Patient does have diabetes. He does also have a history of coronary artery disease. Subsequently his troponin was elevated in the hospital that is part of the reason why they kept him for analysis while he was there. Otherwise the patient seems to be doing well and I think he has a good chance of getting this to heal with appropriate treatment. 09/09/2020 upon evaluation today patient actually appears to be making good progress here with regard to his wounds. Fortunately there is no signs of active infection at this time. No fevers, chills, nausea, vomiting, or diarrhea. He has been tolerating the dressing changes without complication and his daughter-in-law is doing an excellent job taking care of this. 09/23/2020 upon evaluation today patient is making good progress in regard to the wounds in the gluteal region. He has been tolerating the dressing changes without complication and very pleased that he seems to be doing so well. There does not appear to be any evidence of active infection which is great news and overall I think that the patient is making wonderful progress. 10/07/20 upon evaluation today patient appears to be doing excellent in regard to his right gluteal region. He has a healed wound on the distal gluteal region and proximally this is measuring much better. That is great news. Unfortunately he did have a new area open up on the left gluteal region this does  appear to be an abscess and does not have to be cleaned out. He has been on doxycycline as prescribed by urgent care. 10/15/2020 upon evaluation today patient appears to be doing well with regard to his wounds in the gluteal region. I feel  like he is actually making good progress here. Fortunately there does not appear any signs of active infection which is great news. No fevers, chills, nausea, vomiting, or diarrhea. 10/21/2020 upon evaluation today patient appears to be doing well with regard to his wounds. Is been tolerating the dressing changes without complication. The right side is healed the left side though not healed does appear to be doing well there is a little bit of debridement around the edges of the wound internally that do need to be performed. There is some slough here. 4/29; this is a patient who had 2 small wounds on his left buttock. These were apparently initially abscesses as defined during a trip to the ER or urgent care in [place] on 09/12/2020. 1 of these has actually healed. They tell me that he spent 3 days in the hospital with pneumonia since the last time he was here. They labeled these pressure ulcers and did not specifically address them. In looking over the initial diagnosis I do not really see a picture. They describe these as abscess is hard and indurated without any ability to express any pus. We are using silver collagen with backing plain packing gauze. The patient is offloading these as best he can. 11/08/2020 upon evaluation today patient appears to be doing well with regard to his wound currently. In fact this is showing signs of excellent improvement. I do not see any signs of infection whatsoever. In fact there is really not having any wound for any packing gauze at this point. They are just doing the collagen which I think is absolutely appropriate. 11/30/2020 upon evaluation today patient appears to be doing excellent in regard to his wounds on the gluteal region.  In fact he appears to be completely healed. Objective Constitutional Well-nourished and well-hydrated in no acute distress. Vitals Time Taken: 11:12 AM, Height: 70 in, Weight: 170 lbs, BMI: 24.4, Temperature: 98.2 F, Pulse: 59 bpm, Respiratory Rate: 18 breaths/min, Blood Pressure: 100/53 mmHg. Respiratory Catalfamo, Aureliano J. ([id number]) normal breathing without difficulty. Psychiatric this patient is able to make decisions and demonstrates good insight into disease process. Alert and Oriented x 3. pleasant and cooperative. General Notes: Upon inspection patient's wound bed showed signs of complete epithelization. I do not see any signs of infection right now which is great news and overall he is doing extremely well and again I think he is ready for discharge. Integumentary (Hair, Skin) Wound #4 status is Healed - Epithelialized. Original cause of wound was Gradually Appeared. The date acquired was: 09/23/2020. The wound has been in treatment 7 weeks. The wound is located on the Left Gluteus. The wound measures 0cm length x 0cm width x 0cm depth; 0cm^2 area and 0cm^3 volume. There is no tunneling or undermining noted. There is a none present amount of drainage noted. There is no granulation within the wound bed. There is no necrotic tissue within the wound bed. 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 Would recommend currently that going continue with the wound care measures as before and the patient is in agreement with plan. This includes the use of the AandE ointment for protection I think still intermittently  using the Hibiclens is absolutely appropriate. 2.  I am also can recommend that we have the patient go ahead and continue with appropriate offloading just to bring sure that there is no pressure excessively get into these areas in question. I think that still of utmost concern. We will see the patient back for follow-up visit as needed if anything changes or worsens. Electronic Signature(s) Signed: 11/30/2020 5:38:46 PM By: Lenda Kelp PA-C Entered By: Lenda Kelp on 11/30/2020 17:38:46 Evon, [name] ([id number]) -------------------------------------------------------------------------------- SuperBill Details Patient Name: SRIRAM, FEBLES. Date of Service: 11/30/2020 Medical Record Number: [id number] Patient Account Number: [id number] Date of Birth/Sex: 1928/07/24 (87 y.o. M) Treating RN: Rogers Blocker Primary Care Provider: Mickey Farber Other Clinician: Referring Provider: Mickey Farber Treating Provider/Extender: Rowan Blase in Treatment: 13 Diagnosis Coding ICD-10 Codes Code Description L02.31 Cutaneous abscess of buttock L98.412 Non-pressure chronic ulcer of buttock with fat layer exposed E11.622 Type 2 diabetes mellitus with other skin ulcer I25.10 Atherosclerotic heart disease of native coronary artery without angina pectoris I50.42 Chronic combined systolic (congestive) and diastolic (congestive) heart failure Facility Procedures CPT4 Code: [id number] Description: [phone] - WOUND CARE VISIT-LEV 2 EST PT Modifier: Quantity: 1 Physician Procedures CPT4 Code: [id number] Description: 99213 - WC PHYS LEVEL 3 - EST PT Modifier: Quantity: 1 CPT4 Code: Description: ICD-10 Diagnosis Description L02.31 Cutaneous abscess of buttock L98.412 Non-pressure chronic ulcer of buttock with fat layer exposed E11.622 Type 2 diabetes mellitus with other skin ulcer I25.10 Atherosclerotic heart disease of native  coronary artery without angina Modifier:  pectoris Quantity: Electronic Signature(s) Signed: 11/30/2020 5:39:16 PM By: Lenda Kelp PA-C Previous Signature: 11/30/2020 4:43:03 PM Version By: Phillis Haggis, Dondra Prader RN Entered By: Lenda Kelp on 11/30/2020 17:39:16

## 2021-07-02 ENCOUNTER — Ambulatory Visit
Admission: EM | Admit: 2021-07-02 | Discharge: 2021-07-02 | Disposition: A | Discharge status: Home / Self Care | Payer: Medicare Other | Attending: Emergency Medicine | Admitting: Emergency Medicine

## 2021-07-02 ENCOUNTER — Other Ambulatory Visit: Payer: Self-pay

## 2021-07-02 DIAGNOSIS — N390 Urinary tract infection, site not specified: Secondary | ICD-10-CM | POA: Insufficient documentation

## 2021-07-02 DIAGNOSIS — T83510A Infection and inflammatory reaction due to cystostomy catheter, initial encounter: Secondary | ICD-10-CM | POA: Diagnosis not present

## 2021-07-02 LAB — URINALYSIS, COMPLETE (UACMP) WITH MICROSCOPIC
Bilirubin Urine: NEGATIVE
Glucose, UA: NEGATIVE mg/dL
Ketones, ur: NEGATIVE mg/dL
Nitrite: NEGATIVE
Protein, ur: NEGATIVE mg/dL
Specific Gravity, Urine: 1.01 (ref 1.005–1.030)
WBC, UA: >50 WBC/hpf (ref 0–5)
pH: 7 (ref 5.0–8.0)

## 2021-07-02 MED ORDER — CIPROFLOXACIN HCL 500 MG PO TABS: 500.0000 mg | ORAL_TABLET | Freq: Every day | ORAL | 0 refills | Status: AC

## 2021-07-02 NOTE — Discharge Instructions (Addendum)
Finish antibiotics, even if you feel better.  Please follow-up with your urologist.  Continue drink plenty of fluids.  Please go immediately to the emergency room for back pain, fevers above 100.4, nausea, vomiting, abdominal pain, or any other concerns.

## 2021-07-02 NOTE — ED Provider Notes (Signed)
HPI  SUBJECTIVE:  [name] is a 87 y.o. male who presents with odorous, cloudy urine with sediment coming from his suprapubic catheter starting last night.  No nausea, vomiting, fevers, body aches, back, abdominal, perineal pain, testicular pain or swelling.  All urine passes through the suprapubic catheter.  It was changed 2 weeks ago.  He states that it is working well.  He does not know of any obstruction.  He denies urinary retention or abdominal swelling.  No erythema, itching, purulent drainage coming from around the catheter site.  He tried increasing fluids without improvement in his symptoms.  No aggravating factors.  Patient has a past medical history of coronary artery disease, congestive heart failure, diabetes, disease, MI, atrial fibrillation, bilateral carotid artery stenosis, AAA, chronic kidney disease stage III, acute urinary retention-  has a suprapubic catheter and is followed closely by urology.  He is not anticoagulated due to concerns of high bleeding risk.  PMD: At the [facility] clinic.  Urology: [facility]  Past Medical History:  Diagnosis Date   CAD (coronary artery disease)    Cardiac defibrillator in place 01/2020   CHF (congestive heart failure) (HCC)    Diabetes mellitus without complication (HCC)    History of myocardial infarction 01/2020   Peripheral neuropathy    Restless leg syndrome     Past Surgical History:  Procedure Laterality Date   APPENDECTOMY     CARDIAC DEFIBRILLATOR PLACEMENT Left 01/2020   PARTIAL KNEE ARTHROPLASTY Left     History reviewed. No pertinent family history.  Social History   Tobacco Use   Smoking status: Never   Smokeless tobacco: Never  Vaping Use   Vaping Use: Never used  Substance Use Topics   Alcohol use: Not Currently   Drug use: Not Currently    No current facility-administered medications for this encounter.  Current Outpatient Medications:    acetaminophen (TYLENOL) 500 MG tablet, Take 1  tablet (500 mg total) by mouth every 6 (six) hours as needed., Disp: , Rfl: 0   albuterol (VENTOLIN HFA) 108 (90 Base) MCG/ACT inhaler, Inhale 2 puffs into the lungs every 6 (six) hours as needed for wheezing or shortness of breath. Use 2 times daily x5 days, then every 6 hours as needed., Disp: 8 g, Rfl: 0   aspirin 81 MG EC tablet, Take 81 mg by mouth daily., Disp: , Rfl:    brimonidine (ALPHAGAN P) 0.1 % SOLN, Place 1 drop into the left eye 2 (two) times daily., Disp: , Rfl:    ciprofloxacin (CIPRO) 500 MG tablet, Take 1 tablet (500 mg total) by mouth daily with breakfast for 7 days., Disp: 7 tablet, Rfl: 0   dorzolamide (TRUSOPT) 2 % ophthalmic solution, Place 1 drop into both eyes 2 (two) times daily., Disp: , Rfl:    ENTRESTO 24-26 MG, SMARTSIG:1 Tablet(s) By Mouth Every 12 Hours, Disp: 60 tablet, Rfl:    furosemide (LASIX) 40 MG tablet, Take 1 tablet (40 mg total) by mouth daily., Disp: 30 tablet, Rfl:    gabapentin (NEURONTIN) 300 MG capsule, Take 300 mg by mouth 2 (two) times daily., Disp: , Rfl:    guaiFENesin (MUCINEX) 600 MG 12 hr tablet, Take 600 mg by mouth 2 (two) times daily., Disp: , Rfl:    latanoprost (XALATAN) 0.005 % ophthalmic solution, Place 1 drop into both eyes at bedtime., Disp: , Rfl:    metFORMIN (GLUCOPHAGE) 500 MG tablet, Take 500 mg by mouth daily with breakfast., Disp: , Rfl:  metoprolol succinate (TOPROL-XL) 25 MG 24 hr tablet, Take 25 mg by mouth daily., Disp: , Rfl:    midodrine (PROAMATINE) 5 MG tablet, Take 1 tablet (5 mg total) by mouth 3 (three) times daily with meals., Disp: 90 tablet, Rfl: 1   Multiple Vitamin (MULTI-VITAMIN) tablet, Take 1 tablet by mouth daily., Disp: , Rfl:    tamsulosin (FLOMAX) 0.4 MG CAPS capsule, Take 0.4 mg by mouth daily., Disp: , Rfl:   No Known Allergies   ROS  As noted in HPI.   Physical Exam  BP 130/72 (BP Location: Right Arm)    Pulse 60    Temp 98 F (36.7 C) (Oral)    Resp 18    Ht 6' (1.829 m)    Wt 77.1 kg     SpO2 99%    BMI 23.06 kg/m   Constitutional: Well developed, well nourished, no acute distress Eyes:  EOMI, conjunctiva normal bilaterally HENT: Normocephalic, atraumatic,mucus membranes moist Respiratory: Normal inspiratory effort Cardiovascular: Normal rate GI: nondistended soft.  No palpable bladder.  No tenderness over the entire abdomen.  Suprapubic catheter site intact with no surrounding erythema, tenderness, expressible purulent drainage. Back: No CVAT skin: No rash, skin intact Musculoskeletal: no deformities Neurologic: Alert & oriented x 3, no focal neuro deficits Psychiatric: Speech and behavior appropriate   ED Course   Medications - No data to display  Orders Placed This Encounter  Procedures   Urine Culture    Standing Status:   Standing    Number of Occurrences:   1    Order Specific Question:   Indication    Answer:   Dysuria   Urinalysis, Complete w Microscopic    Standing Status:   Standing    Number of Occurrences:   1    Results for orders placed or performed during the hospital encounter of 07/02/21 (from the past 24 hour(s))  Urinalysis, Complete w Microscopic Urine, Clean Catch     Status: Abnormal   Collection Time: 07/02/21 11:34 AM  Result Value Ref Range   Color, Urine YELLOW YELLOW   APPearance HAZY (A) CLEAR   Specific Gravity, Urine 1.010 1.005 - 1.030   pH 7.0 5.0 - 8.0   Glucose, UA NEGATIVE NEGATIVE mg/dL   Hgb urine dipstick TRACE (A) NEGATIVE   Bilirubin Urine NEGATIVE NEGATIVE   Ketones, ur NEGATIVE NEGATIVE mg/dL   Protein, ur NEGATIVE NEGATIVE mg/dL   Nitrite NEGATIVE NEGATIVE   Leukocytes,Ua LARGE (A) NEGATIVE   Squamous Epithelial / LPF 0-5 0 - 5   WBC, UA >50 0 - 5 WBC/hpf   RBC / HPF 0-5 0 - 5 RBC/hpf   Bacteria, UA MANY (A) NONE SEEN   WBC Clumps PRESENT    No results found.  ED Clinical Impression  1. Urinary tract infection associated with cystostomy catheter, initial encounter [facility])      ED  Assessment/Plan  Previous and outside labs, records reviewed. Additional PMH obtained. As noted in HPI.  Last urine culture from 4 months ago grew out mixed flora.  Urine culture from 12/08/2020 grew Stenotrophomonas maltophilia susceptible to ceftazidime, Levaquin, Bactrim.  Urine suggestive of a UTI with large leukocytes, many bacteria, pyuria and trace blood.  Sending off for culture.  There does not appear to be an infection/cellulitis surrounding the catheter.  He has no CVA tenderness, systemic symptoms.  Doubt prostatitis.  Patient declined prostate exam.  Will send home with renally dose Cipro for 7 days-500 mg every 24 hours per  up-to-date guidelines.  Calculated creatinine clearance based from labs 05/16/2021 27 mL/min  Discussed labs, MDM, treatment plan, and plan for follow-up with patient. Discussed sn/sx that should prompt return to the ED. patient agrees with plan.   Meds ordered this encounter  Medications   ciprofloxacin (CIPRO) 500 MG tablet    Sig: Take 1 tablet (500 mg total) by mouth daily with breakfast for 7 days.    Dispense:  7 tablet    Refill:  0      *This clinic note was created using [employer]. Therefore, there may be occasional mistakes despite careful proofreading.  ?    Melynda Ripple, MD 07/03/21 1550

## 2021-07-02 NOTE — ED Triage Notes (Signed)
Pt here with C/O milky urine, has had this before and was given antibiotics it was better.

## 2021-07-05 LAB — URINE CULTURE: Culture: >=100000 — AB

## 2022-04-18 ENCOUNTER — Encounter (HOSPITAL_BASED_OUTPATIENT_CLINIC_OR_DEPARTMENT_OTHER): Payer: Medicare Other | Attending: General Surgery | Admitting: General Surgery

## 2022-04-18 DIAGNOSIS — L97522 Non-pressure chronic ulcer of other part of left foot with fat layer exposed: Secondary | ICD-10-CM | POA: Diagnosis not present

## 2022-04-18 DIAGNOSIS — I2510 Atherosclerotic heart disease of native coronary artery without angina pectoris: Secondary | ICD-10-CM | POA: Insufficient documentation

## 2022-04-18 DIAGNOSIS — N189 Chronic kidney disease, unspecified: Secondary | ICD-10-CM | POA: Insufficient documentation

## 2022-04-18 DIAGNOSIS — E11621 Type 2 diabetes mellitus with foot ulcer: Secondary | ICD-10-CM | POA: Diagnosis present

## 2022-04-18 DIAGNOSIS — I5022 Chronic systolic (congestive) heart failure: Secondary | ICD-10-CM | POA: Insufficient documentation

## 2022-04-18 DIAGNOSIS — Z95810 Presence of automatic (implantable) cardiac defibrillator: Secondary | ICD-10-CM | POA: Diagnosis not present

## 2022-04-18 DIAGNOSIS — E1140 Type 2 diabetes mellitus with diabetic neuropathy, unspecified: Secondary | ICD-10-CM | POA: Insufficient documentation

## 2022-04-18 DIAGNOSIS — J9611 Chronic respiratory failure with hypoxia: Secondary | ICD-10-CM | POA: Insufficient documentation

## 2022-04-18 DIAGNOSIS — E1122 Type 2 diabetes mellitus with diabetic chronic kidney disease: Secondary | ICD-10-CM | POA: Diagnosis not present

## 2022-04-24 ENCOUNTER — Other Ambulatory Visit: Payer: Self-pay

## 2022-04-24 ENCOUNTER — Encounter: Payer: Self-pay | Admitting: Emergency Medicine

## 2022-04-24 ENCOUNTER — Ambulatory Visit
Admission: EM | Admit: 2022-04-24 | Discharge: 2022-04-24 | Disposition: A | Discharge status: Home / Self Care | Payer: Medicare Other

## 2022-04-24 ENCOUNTER — Ambulatory Visit (INDEPENDENT_AMBULATORY_CARE_PROVIDER_SITE_OTHER): Payer: Medicare Other

## 2022-04-24 DIAGNOSIS — R911 Solitary pulmonary nodule: Secondary | ICD-10-CM

## 2022-04-24 DIAGNOSIS — R059 Cough, unspecified: Secondary | ICD-10-CM

## 2022-04-24 DIAGNOSIS — J22 Unspecified acute lower respiratory infection: Secondary | ICD-10-CM

## 2022-04-24 DIAGNOSIS — J849 Interstitial pulmonary disease, unspecified: Secondary | ICD-10-CM

## 2022-04-24 DIAGNOSIS — M461 Sacroiliitis, not elsewhere classified: Secondary | ICD-10-CM

## 2022-04-24 MED ORDER — DOXYCYCLINE HYCLATE 100 MG PO CAPS: 100.0000 mg | ORAL_CAPSULE | Freq: Two times a day (BID) | ORAL | 0 refills | Status: AC

## 2022-04-24 MED ORDER — AMOXICILLIN-POT CLAVULANATE 875-125 MG PO TABS: 1.0000 | ORAL_TABLET | Freq: Two times a day (BID) | ORAL | 0 refills | Status: AC

## 2022-04-24 NOTE — ED Provider Notes (Signed)
[facility]    CSN: [id number] Arrival date & time: 04/24/22  1503      History   Chief Complaint Chief Complaint  Patient presents with   Cough    HPI [name] is a 88 y.o. male.   Pleasant 88 year old male with a past medical history of congestive heart failure and cardiac defibrillator status presents today due to concerns of cough.  Patient's 69 year old wife, and his son all have similar symptoms.  They all live together.  Patient states his primary concern is his nose that is "running."  Endorses some posterior rhinorrhea, but states he cannot keep his nose dry.  Denies a sore throat or fever.  Does have a history of CHF in the past, his son states he typically does not present with weight gain or edema.  Patient does feel slightly short of breath.  He does require nasal oxygen at baseline.  Normal O2 is 97%. Started O2 via nasal cannula after having pneumonia secondary to influenza over one year ago. No additional pulmonary issues known. No OTC medications tried. Does have some SI joint pain as well. Son states he has been primarily sitting in a recliner chair for the past several days.    Cough   Past Medical History:  Diagnosis Date   CAD (coronary artery disease)    Cardiac defibrillator in place 01/2020   CHF (congestive heart failure) (HCC)    Diabetes mellitus without complication ([place])    History of myocardial infarction 01/2020   Peripheral neuropathy    Restless leg syndrome     Patient Active Problem List   Diagnosis Date Noted   Community acquired pneumonia 10/24/2020   Acute respiratory failure with hypoxia ([place]) 10/24/2020   Acute-on-chronic kidney injury ([place]) 10/24/2020   Influenza A 10/24/2020   Elevated troponin 08/23/2020   Diabetes mellitus without complication ([place]) 36/14/4315   BPH (benign prostatic hyperplasia) 08/23/2020   Postural dizziness with presyncope 08/23/2020   AICD (automatic cardioverter/defibrillator)  present 08/23/2020   Carotid stenosis 08/23/2020   Sacral decubitus ulcer 08/23/2020   Near syncope 40/02/6760   Chronic systolic CHF (congestive heart failure), NYHA class 3 ([place]) 07/20/2020   Coronary artery disease involving native coronary artery of native heart 07/20/2020   Hyperlipidemia, mixed 07/20/2020   Atrial fibrillation ([place]) 07/06/2020    Past Surgical History:  Procedure Laterality Date   APPENDECTOMY     CARDIAC DEFIBRILLATOR PLACEMENT Left 01/2020   PARTIAL KNEE ARTHROPLASTY Left        Home Medications    Prior to Admission medications   Medication Sig Start Date End Date Taking? Authorizing Provider  amoxicillin-clavulanate (AUGMENTIN) 875-125 MG tablet Take 1 tablet by mouth 2 (two) times daily with a meal for 5 days. 04/24/22 04/29/22 Yes Shannelle Alguire L, PA  doxycycline (VIBRAMYCIN) 100 MG capsule Take 1 capsule (100 mg total) by mouth 2 (two) times daily for 5 days. 04/24/22 04/29/22 Yes Mikiala Fugett L, PA  acetaminophen (TYLENOL) 500 MG tablet Take 1 tablet (500 mg total) by mouth every 6 (six) hours as needed. 08/24/20   Jennye Boroughs, MD  albuterol (VENTOLIN HFA) 108 (90 Base) MCG/ACT inhaler Inhale 2 puffs into the lungs every 6 (six) hours as needed for wheezing or shortness of breath. Use 2 times daily x5 days, then every 6 hours as needed. 10/27/20   Eugenie Filler, MD  amiodarone (PACERONE) 200 MG tablet Take 200 mg by mouth daily. 04/10/22   [provider]  aspirin 81 MG EC tablet Take 81 mg by mouth daily.    [provider]  brimonidine (ALPHAGAN P) 0.1 % SOLN Place 1 drop into the left eye 2 (two) times daily.    [provider]  dorzolamide (TRUSOPT) 2 % ophthalmic solution Place 1 drop into both eyes 2 (two) times daily.    [provider]  ENTRESTO 24-26 MG SMARTSIG:1 Tablet(s) By Mouth Every 12 Hours 11/01/20   Rodolph Bong, MD  furosemide (LASIX) 40 MG tablet Take 1 tablet (40 mg total) by mouth  daily. 11/01/20   Rodolph Bong, MD  gabapentin (NEURONTIN) 300 MG capsule Take 300 mg by mouth 2 (two) times daily. 07/06/20   [provider]  guaiFENesin (MUCINEX) 600 MG 12 hr tablet Take 600 mg by mouth 2 (two) times daily.    [provider]  latanoprost (XALATAN) 0.005 % ophthalmic solution Place 1 drop into both eyes at bedtime.    [provider]  metFORMIN (GLUCOPHAGE) 500 MG tablet Take 500 mg by mouth daily with breakfast. Patient not taking: Reported on 04/24/2022    [provider]  metoprolol succinate (TOPROL-XL) 25 MG 24 hr tablet Take 12.5 mg by mouth daily. 08/11/20   [provider]  midodrine (PROAMATINE) 5 MG tablet Take 1 tablet (5 mg total) by mouth 3 (three) times daily with meals. 10/27/20   Rodolph Bong, MD  Multiple Vitamin (MULTI-VITAMIN) tablet Take 1 tablet by mouth daily.    [provider]  tamsulosin (FLOMAX) 0.4 MG CAPS capsule Take 0.4 mg by mouth daily. Patient not taking: Reported on 04/24/2022    [provider]    Family History History reviewed. No pertinent family history.  Social History Social History   Tobacco Use   Smoking status: Never   Smokeless tobacco: Never  Vaping Use   Vaping Use: Never used  Substance Use Topics   Alcohol use: Not Currently   Drug use: Not Currently     Allergies   Patient has no known allergies.   Review of Systems Review of Systems  Respiratory:  Positive for cough.   As per HPI   Physical Exam Triage Vital Signs ED Triage Vitals  Enc Vitals Group     BP 04/24/22 1606 138/62     Pulse Rate 04/24/22 1606 60     Resp 04/24/22 1606 (!) 28     Temp 04/24/22 1606 (!) 97.5 F (36.4 C)     Temp Source 04/24/22 1606 Oral     SpO2 04/24/22 1606 92 %     Weight --      Height --      Head Circumference --      Peak Flow --      Pain Score 04/24/22 1603 0     Pain Loc --      Pain Edu? --      Excl. in GC? --    No data  found.  Updated Vital Signs BP 138/62 (BP Location: Right Arm)   Pulse 60   Temp (!) 97.5 F (36.4 C) (Oral)   Resp (!) 28   SpO2 92%   Visual Acuity Right Eye Distance:   Left Eye Distance:   Bilateral Distance:    Right Eye Near:   Left Eye Near:    Bilateral Near:     Physical Exam Vitals and nursing note reviewed. Exam conducted with a chaperone present.  Constitutional:  General: He is not in acute distress.    Appearance: Normal appearance. He is well-developed. He is not ill-appearing, toxic-appearing or diaphoretic.     Comments: Sitting comfortably on bed, kyphotic posture.  HENT:     Head: Normocephalic and atraumatic.     Right Ear: Tympanic membrane, ear canal and external ear normal. There is no impacted cerumen.     Left Ear: Tympanic membrane, ear canal and external ear normal. There is no impacted cerumen.     Nose: Rhinorrhea (clear anterior rhinorrhea) present.     Mouth/Throat:     Mouth: Mucous membranes are moist.     Pharynx: No oropharyngeal exudate or posterior oropharyngeal erythema.  Eyes:     Extraocular Movements: Extraocular movements intact.     Conjunctiva/sclera: Conjunctivae normal.     Pupils: Pupils are equal, round, and reactive to light.  Cardiovascular:     Rate and Rhythm: Normal rate and regular rhythm.     Heart sounds: No murmur heard. Pulmonary:     Effort: Pulmonary effort is normal. No respiratory distress.     Comments: Decreased breath sounds without appreciable adventitious sounds Abdominal:     Palpations: Abdomen is soft.     Tenderness: There is no abdominal tenderness.  Musculoskeletal:        General: Tenderness (reproducible tenderness to SI joint bilaterally) present. No swelling.     Cervical back: Normal range of motion and neck supple. No rigidity or tenderness.     Right lower leg: No edema.     Left lower leg: No edema.  Lymphadenopathy:     Cervical: No cervical adenopathy.  Skin:    General: Skin  is warm and dry.     Capillary Refill: Capillary refill takes less than 2 seconds.     Findings: No erythema or rash.  Neurological:     General: No focal deficit present.     Mental Status: He is alert and oriented to person, place, and time.  Psychiatric:        Mood and Affect: Mood normal.      UC Treatments / Results  Labs (all labs ordered are listed, but only abnormal results are displayed) Labs Reviewed - No data to display  EKG   Radiology DG Chest 2 View  Result Date: 04/24/2022 CLINICAL DATA:  Dry cough. EXAM: CHEST - 2 VIEW COMPARISON:  10/24/2020 FINDINGS: The cardio pericardial silhouette is enlarged. Diffuse chronic interstitial disease noted with peripheral predominance compatible with pulmonary fibrosis. 12 mm right parahilar nodule is superimposed on the posterior sixth rib. Left-sided pacer/AICD again noted. No pleural effusion. The visualized bony structures of the thorax are unremarkable. IMPRESSION: 1. 12 mm right parahilar nodule. CT chest without contrast recommended to further evaluate. 2. Chronic interstitial lung disease. Electronically Signed   By: Kennith Center M.D.   On: 04/24/2022 16:53    Procedures Procedures (including critical care time)  Medications Ordered in UC Medications - No data to display  Initial Impression / Assessment and Plan / UC Course  I have reviewed the triage vital signs and the nursing notes.  Pertinent labs & imaging results that were available during my care of the patient were reviewed by me and considered in my medical decision making (see chart for details).     Interstitial lung disease with lower respiratory tract infection -chest x-ray does not show a consolidation consistent with pneumonia, however clinically, I suspect patient likely mildly dehydrated and thus pneumonia cannot be completely  ruled out.  Patient having a lower O2 than normal despite nasal cannula oxygen.  Given chronic pulmonary issues and advanced  age, aggressive therapy with ABX for CAP indicated.  Discussed x-ray findings with family, recommended follow-up with PCP for further evaluation.  Patient to monitor oxygen levels at home. Lung nodule -12 mm solitary perihilar nodule noted.  Follow-up with PCP for CT scan as recommended by radiologist. SI joint inflammation -reproducible pain to SI joint.  Suspect this to be secondary to extensive sitting recently, possibly however also related to inflammation.  Supportive measures.  Warm compresses, possible over-the-counter Salonpas patch.   Final Clinical Impressions(s) / UC Diagnoses   Final diagnoses:  Lung interstitial disease (HCC)  Infection of lower respiratory tract  Lung nodule, solitary  SI (sacroiliac) joint inflammation (HCC)     Discharge Instructions      Your chest x-ray findings are concerning for chronic pulmonary fibrosis, or interstitial lung disease. This makes you more susceptible to bacterial lower respiratory tract infections. You also have a single lung nodule that needs to be followed by your PCP. CT scan of chest is recommended. Please start taking the Augmentin and the doxycycline twice daily until gone. Please take an over-the-counter probiotic or eat yogurt daily to help offset the chance of GI upset or diarrhea on the antibiotics. Take Mucinex 1-2 times daily as tolerated. Please follow-up with PCP in the next 1 to 2 weeks to ensure symptom resolution.  Should you develop worsening shortness of breath, fever, please had to the ER.     ED Prescriptions     Medication Sig Dispense Auth. Provider   doxycycline (VIBRAMYCIN) 100 MG capsule Take 1 capsule (100 mg total) by mouth 2 (two) times daily for 5 days. 10 capsule Verena Shawgo L, PA   amoxicillin-clavulanate (AUGMENTIN) 875-125 MG tablet Take 1 tablet by mouth 2 (two) times daily with a meal for 5 days. 10 tablet Kayla Weekes L, [place]      PDMP not reviewed this encounter.   Maretta Bees,  [place] 04/24/22 1727

## 2022-04-24 NOTE — ED Triage Notes (Signed)
Cough and nasal congestion for one week

## 2022-04-24 NOTE — Discharge Instructions (Addendum)
Your chest x-ray findings are concerning for chronic pulmonary fibrosis, or interstitial lung disease. This makes you more susceptible to bacterial lower respiratory tract infections. You also have a single lung nodule that needs to be followed by your PCP. CT scan of chest is recommended. Please start taking the Augmentin and the doxycycline twice daily until gone. Please take an over-the-counter probiotic or eat yogurt daily to help offset the chance of GI upset or diarrhea on the antibiotics. Take Mucinex 1-2 times daily as tolerated. Please follow-up with PCP in the next 1 to 2 weeks to ensure symptom resolution.  Should you develop worsening shortness of breath, fever, please had to the ER.

## 2022-04-26 ENCOUNTER — Encounter (HOSPITAL_BASED_OUTPATIENT_CLINIC_OR_DEPARTMENT_OTHER): Payer: Medicare Other | Admitting: General Surgery

## 2022-04-26 DIAGNOSIS — E11621 Type 2 diabetes mellitus with foot ulcer: Secondary | ICD-10-CM | POA: Diagnosis not present

## 2022-04-27 NOTE — Progress Notes (Signed)
[name], [name] ([id number]) [id number] Page 1 of 8 Visit Report for 04/26/2022 Chief Complaint Document Details Patient Name: Date of Service: [name], [name]. 04/26/2022 2:00 PM Medical Record Number: [id number] Patient Account Number: [id number] Date of Birth/Sex: Treating RN: [birth year][name] (88 y.o. M) Primary Care Provider: Maudie FlakesFeldpausch, Dale Other Clinician: Referring Provider: Treating Provider/Extender: Mellody Memosannon, [name], Dale Weeks in Treatment: 1 Information Obtained from: Patient Chief Complaint Patients presents for treatment of an open diabetic ulcer Electronic Signature(s) Signed: 04/26/2022 2:50:19 PM By: Duanne Guessannon, Jeanna Giuffre MD FACS Entered By: Duanne Guessannon, Anapaola Kinsel on 04/26/2022 14:50:19 -------------------------------------------------------------------------------- Debridement Details Patient Name: Date of Service: [name], Jeremy LBERT Wallace. 04/26/2022 2:00 PM Medical Record Number: [id number] Patient Account Number: [id number] Date of Birth/Sex: Treating RN: [birth year][name] (88 y.o. [name]) Boehlein, Linda Primary Care Provider: Maudie FlakesFeldpausch, Dale Other Clinician: Referring Provider: Treating Provider/Extender: Mellody Memosannon, [name], Dale Weeks in Treatment: 1 Debridement Performed for Assessment: Wound #1 Medial T Great oe Performed By: Physician Duanne Guessannon, Kendall Justo, MD Debridement Type: Debridement Severity of Tissue Pre Debridement: Fat layer exposed Level of Consciousness (Pre-procedure): Awake and Alert Pre-procedure Verification/Time Out Yes - 14:45 Taken: Start Time: 14:46 Pain Control: Lidocaine 4% T opical Solution T Area Debrided (L x W): otal 0.3 (cm) x 0.4 (cm) = 0.Wallace (cm) Tissue and other material debrided: Non-Viable, Eschar, Slough, Skin: Epidermis, Slough Level: Skin/Epidermis Debridement Description: Selective/Open Wound Instrument: Curette Bleeding: Minimum Hemostasis Achieved: Pressure Procedural Pain: 2 Post  Procedural Pain: 1 Response to Treatment: Procedure was tolerated well Level of Consciousness (Post- Awake and Alert procedure): Post Debridement Measurements of Total Wound Length: (cm) 0.3 Width: (cm) 0.4 Depth: (cm) 0.2 Volume: (cm) 0.019 Character of Wound/Ulcer Post Debridement: Improved Severity of Tissue Post Debridement: Fat layer exposed [name], [name] ([id number]) [id number] Page 2 of 8 Post Procedure Diagnosis Same as Pre-procedure Electronic Signature(s) Signed: 04/26/2022 3:07:17 PM By: Duanne Guessannon, Karo Rog MD FACS Signed: 04/26/2022 6:06:53 PM By: Zenaida DeedBoehlein, Linda RN, BSN Entered By: Zenaida DeedBoehlein, Linda on 04/26/2022 14:49:05 -------------------------------------------------------------------------------- HPI Details Patient Name: Date of Service: [name], Jeremy LBERT Wallace. 04/26/2022 2:00 PM Medical Record Number: [id number] Patient Account Number: [id number] Date of Birth/Sex: Treating RN: [name] (88 y.o. M) Primary Care Provider: Maudie FlakesFeldpausch, Dale Other Clinician: Referring Provider: Treating Provider/Extender: Mellody Memosannon, Arnesia Vincelette Wallace, Dale Weeks in Treatment: 1 History of Present Illness HPI Description: ADMISSION 04/18/2022 This is Jeremy 88 year old man with Jeremy past medical history significant for CHF [place] Heart Association class III, chronic respiratory failure with hypoxia, type 2 diabetes, AICD, and chronic kidney disease. He recently purchased Jeremy new pair of boots and unfortunately, they rubbed blister on his left great toe. Due to neuropathy, patient thought initially for this. Since then, the blister broke down and he now has an ulcer on the dorsal medial aspect of his great toe. His family has been managing it with hydrocolloid dressings and topical triple antibiotic ointment. It had not been improving, so he was referred to the wound care center for further evaluation and management. His family does not check his blood glucose  regularly but says that it is generally around 140 when they do so. ABI in clinic today was 0.76. The last hemoglobin A1c I have available for my review was from February 2022, at which time it was 7.6. There is Jeremy circular wound on the dorsal medial aspect of the patient's left great toe. It has Jeremy thick layer of fibrinous slough on the surface. It does not probe to bone. There is no purulent  drainage, erythema, or malodor. 04/26/2022: The wound is Jeremy little bit smaller today. There is light slough accumulation over an otherwise clean base. Mild periwound eschar buildup. No concern for infection. Electronic Signature(s) Signed: 04/26/2022 2:50:49 PM By: Fredirick Maudlin MD FACS Entered By: Fredirick Maudlin on 04/26/2022 14:50:49 -------------------------------------------------------------------------------- Physical Exam Details Patient Name: Date of Service: [name], Jeremy LBERT Wallace. 04/26/2022 2:00 PM Medical Record Number: [id number] Patient Account Number: [id number] Date of Birth/Sex: Treating RN: [name], [name] (88 y.o. M) Primary Care Provider: Thereasa Distance Other Clinician: Referring Provider: Treating Provider/Extender: [name] in Treatment: 1 Constitutional . . . . No acute distress.Marland Kitchen Respiratory Normal work of breathing on supplemental oxygen.. Notes 04/26/2022: The wound is Jeremy little bit smaller today. There is light slough accumulation over an otherwise clean base. Mild periwound eschar buildup. No concern for infection. [name], [name] ([id number]) [id number] Page 3 of 8 Electronic Signature(s) Signed: 04/26/2022 2:51:Wallace PM By: Fredirick Maudlin MD FACS Entered By: Fredirick Maudlin on 04/26/2022 14:51:Wallace -------------------------------------------------------------------------------- Physician Orders Details Patient Name: Date of Service: [name], Jeremy LBERT Wallace. 04/26/2022 2:00 PM Medical Record Number:  [id number] Patient Account Number: [id number] Date of Birth/Sex: Treating RN: 02-Jeremy-[name] (88 y.o. [name] Primary Care Provider: Thereasa Distance Other Clinician: Referring Provider: Treating Provider/Extender: [name] in Treatment: 1 Verbal / Phone Orders: No Diagnosis Coding ICD-10 Coding Code Description [phone] Non-pressure chronic ulcer of other part of left foot with fat layer exposed Y85.02 Chronic systolic (congestive) heart failure I25.10 Atherosclerotic heart disease of native coronary artery without angina pectoris E11.621 Type 2 diabetes mellitus with foot ulcer N18.9 Chronic kidney disease, unspecified Follow-up Appointments ppointment in 1 week. - Dr. Celine Ahr Rm 4 Return Jeremy Anesthetic Wound #1 Medial T Great oe (In clinic) Topical Lidocaine 4% applied to wound bed - in clinic, prior to debridement Bathing/ Shower/ Hygiene May shower and wash wound with soap and water. - On days when dressing is changed, every other day Edema Control - Lymphedema / SCD / Other Elevate legs to the level of the heart or above for 30 minutes daily and/or when sitting, Jeremy frequency of: Avoid standing for long periods of time. Moisturize legs daily. Wound Treatment Wound #1 - T Great oe Wound Laterality: Medial Cleanser: Soap and Water Every Other Day/30 Days Discharge Instructions: May shower and wash wound with dial antibacterial soap and water prior to dressing change. Peri-Wound Care: Sween Lotion (Moisturizing lotion) Every Other Day/30 Days Discharge Instructions: Apply moisturizing lotion as directed Prim Dressing: KerraCel Ag Gelling Fiber Dressing, 2x2 in (silver alginate) (Generic) Every Other Day/30 Days ary Discharge Instructions: Apply silver alginate to wound bed as instructed Prim Dressing: Optifoam Non-Adhesive Dressing, 4x4 in (Generic) Every Other Day/30 Days ary Discharge Instructions: Apply to wound bed cut to make foam  donut Secured With: Conforming Stretch Gauze Bandage, Sterile 2x75 (in/in) Every Other Day/30 Days Discharge Instructions: Secure with stretch gauze as directed. Secured With: Transpore Surgical Tape, 2x10 (in/yd) (Generic) Every Other Day/30 Days Discharge Instructions: Secure dressing with tape as directed. Patient Medications llergies: No Known Allergies Jeremy Notifications Medication Indication Start End prior to debridement 04/26/2022 lidocaine [name], [name] ([id number]) [id number] Page 4 of 8 DOSE topical 4 % cream - cream topical Electronic Signature(s) Signed: 04/26/2022 3:07:17 PM By: Fredirick Maudlin MD FACS Signed: 04/26/2022 6:06:53 PM By: Baruch Gouty RN, BSN Entered By: Baruch Gouty on 04/26/2022 14:53:27 -------------------------------------------------------------------------------- Problem List Details Patient Name: Date of Service: [name],  Jeremy LBERT Wallace. 04/26/2022 2:00 PM Medical Record Number: [id number] Patient Account Number: [id number] Date of Birth/Sex: Treating RN: [birth year] (88 y.o. M) Primary Care Provider: Maudie Flakes Other Clinician: Referring Provider: Treating Provider/Extender: Mellody Memos in Treatment: 1 Active Problems ICD-10 Encounter Code Description Active Date MDM Diagnosis [phone] Non-pressure chronic ulcer of other part of left foot with fat layer exposed 04/18/2022 No Yes I50.22 Chronic systolic (congestive) heart failure 04/18/2022 No Yes I25.10 Atherosclerotic heart disease of native coronary artery without angina pectoris 04/18/2022 No Yes E11.621 Type 2 diabetes mellitus with foot ulcer 04/18/2022 No Yes N18.9 Chronic kidney disease, unspecified 04/18/2022 No Yes Inactive Problems Resolved Problems Electronic Signature(s) Signed: 04/26/2022 2:50:08 PM By: Duanne Guess MD FACS Entered By: Duanne Guess on 04/26/2022  14:50:07 -------------------------------------------------------------------------------- Progress Note Details Patient Name: Date of Service: [name], Jeremy LBERT Wallace. 04/26/2022 2:00 PM Medical Record Number: [id number] Patient Account Number: [id number] Date of Birth/Sex: Treating RN: [name], [name] (88 y.o. M) Primary Care Provider: Maudie Flakes Other Clinician: JOHNROBERT, [name] ([id number]) [id number] Page 5 of 8 Referring Provider: Treating Provider/Extender: Mellody Memos in Treatment: 1 Subjective Chief Complaint Information obtained from Patient Patients presents for treatment of an open diabetic ulcer History of Present Illness (HPI) ADMISSION 04/18/2022 This is Jeremy 88 year old man with Jeremy past medical history significant for CHF [place] Heart Association class III, chronic respiratory failure with hypoxia, type 2 diabetes, AICD, and chronic kidney disease. He recently purchased Jeremy new pair of boots and unfortunately, they rubbed blister on his left great toe. Due to neuropathy, patient thought initially for this. Since then, the blister broke down and he now has an ulcer on the dorsal medial aspect of his great toe. His family has been managing it with hydrocolloid dressings and topical triple antibiotic ointment. It had not been improving, so he was referred to the wound care center for further evaluation and management. His family does not check his blood glucose regularly but says that it is generally around 140 when they do so. ABI in clinic today was 0.76. The last hemoglobin A1c I have available for my review was from February 2022, at which time it was 7.6. There is Jeremy circular wound on the dorsal medial aspect of the patient's left great toe. It has Jeremy thick layer of fibrinous slough on the surface. It does not probe to bone. There is no purulent drainage, erythema, or malodor. 04/26/2022: The wound is Jeremy little bit smaller  today. There is light slough accumulation over an otherwise clean base. Mild periwound eschar buildup. No concern for infection. Patient History Family History Unknown History, Diabetes - Paternal Grandparents,Maternal Grandparents, No family history of Cancer. Social History Never smoker, Marital Status - Married, Alcohol Use - Rarely, Drug Use - No History, Caffeine Use - Daily. Medical History Eyes Denies history of Cataracts, Glaucoma, Optic Neuritis Ear/Nose/Mouth/Throat Denies history of Chronic sinus problems/congestion, Middle ear problems Hematologic/Lymphatic Denies history of Anemia, Hemophilia, Human Immunodeficiency Virus, Lymphedema, Sickle Cell Disease Respiratory Denies history of Aspiration, Asthma, Chronic Obstructive Pulmonary Disease (COPD), Pneumothorax, Sleep Apnea, Tuberculosis Cardiovascular Patient has history of Congestive Heart Failure, Coronary Artery Disease, Myocardial Infarction Endocrine Patient has history of Type II Diabetes Integumentary (Skin) Denies history of History of Burn Musculoskeletal Denies history of Gout, Rheumatoid Arthritis, Osteoarthritis, Osteomyelitis Neurologic Patient has history of Neuropathy Oncologic Denies history of Received Chemotherapy, Received Radiation Psychiatric Denies history of Anorexia/bulimia, Confinement Anxiety Hospitalization/Surgery History - AICD. Medical Jeremy Surgical History  Notes nd Cardiovascular Atrial Fibrillation Genitourinary BPH Objective Constitutional No acute distress.. Vitals Time Taken: 2:33 PM, Height: 73 in, Weight: 169.7 lbs, BMI: 22.4, Temperature: 97.9 F, Pulse: 60 bpm, Respiratory Rate: 18 breaths/min, Blood Pressure: 138/80 mmHg, Capillary Blood Glucose: 141 mg/dl. Respiratory Normal work of breathing on supplemental oxygen.Marland Kitchen [name], [name] ([id number]) [id number] Page 6 of 8 General Notes: 04/26/2022: The wound is Jeremy little bit smaller today.  There is light slough accumulation over an otherwise clean base. Mild periwound eschar buildup. No concern for infection. Integumentary (Hair, Skin) Wound #1 status is Open. Original cause of wound was Blister. The date acquired was: 03/22/2022. The wound has been in treatment 1 weeks. The wound is located on the Medial T Great. The wound measures 0.3cm length x 0.4cm width x 0.2cm depth; 0.094cm^2 area and 0.019cm^3 volume. There is Fat Layer oe (Subcutaneous Tissue) exposed. There is no tunneling or undermining noted. There is Jeremy small amount of serosanguineous drainage noted. The wound margin is distinct with the outline attached to the wound base. There is small (1-33%) red, pink granulation within the wound bed. There is Jeremy large (67-100%) amount of necrotic tissue within the wound bed including Adherent Slough. The periwound skin appearance had no abnormalities noted for color. The periwound skin appearance exhibited: Callus, Dry/Scaly. The periwound skin appearance did not exhibit: Maceration. Periwound temperature was noted as No Abnormality. Assessment Active Problems ICD-10 Non-pressure chronic ulcer of other part of left foot with fat layer exposed Chronic systolic (congestive) heart failure Atherosclerotic heart disease of native coronary artery without angina pectoris Type 2 diabetes mellitus with foot ulcer Chronic kidney disease, unspecified Procedures Wound #1 Pre-procedure diagnosis of Wound #1 is Jeremy Diabetic Wound/Ulcer of the Lower Extremity located on the Medial T Great .Severity of Tissue Pre Debridement oe is: Fat layer exposed. There was Jeremy Selective/Open Wound Skin/Epidermis Debridement with Jeremy total area of 0.Wallace sq cm performed by Duanne Guess, MD. With the following instrument(s): Curette to remove Non-Viable tissue/material. Material removed includes Eschar, Slough, and Skin: Epidermis after achieving pain control using Lidocaine 4% T opical Solution. No specimens  were taken. Jeremy time out was conducted at 14:45, prior to the start of the procedure. Jeremy Minimum amount of bleeding was controlled with Pressure. The procedure was tolerated well with Jeremy pain level of 2 throughout and Jeremy pain level of 1 following the procedure. Post Debridement Measurements: 0.3cm length x 0.4cm width x 0.2cm depth; 0.019cm^3 volume. Character of Wound/Ulcer Post Debridement is improved. Severity of Tissue Post Debridement is: Fat layer exposed. Post procedure Diagnosis Wound #1: Same as Pre-Procedure Plan 04/26/2022: The wound is Jeremy little bit smaller today. There is light slough accumulation over an otherwise clean base. Mild periwound eschar buildup. No concern for infection. I used Jeremy curette to debride eschar and slough from his wound. We will continue to use silver alginate with Jeremy foam donut for offloading. Follow-up in 1 week. Electronic Signature(s) Signed: 04/26/2022 2:52:19 PM By: Duanne Guess MD FACS Entered By: Duanne Guess on 04/26/2022 14:52:18 -------------------------------------------------------------------------------- HxROS Details Patient Name: Date of Service: [name], Jeremy LBERT Wallace. 04/26/2022 2:00 PM Medical Record Number: [id number] Patient Account Number: [id number] Date of Birth/Sex: Treating RN: [birth year] (88 y.o. M) Primary Care Provider: Maudie Flakes Other Clinician: Referring Provider: Treating Provider/Extender: Mellody Memos in Treatment: [address] RUBE, SANCHEZ ([id number]) [id number] Page 7 of 8 Medical History: Negative for: Cataracts; Glaucoma; Optic Neuritis Ear/Nose/Mouth/Throat Medical History: Negative for: Chronic sinus  problems/congestion; Middle ear problems Hematologic/Lymphatic Medical History: Negative for: Anemia; Hemophilia; Human Immunodeficiency Virus; Lymphedema; Sickle Cell Disease Respiratory Medical History: Negative for: Aspiration; Asthma; Chronic  Obstructive Pulmonary Disease (COPD); Pneumothorax; Sleep Apnea; Tuberculosis Cardiovascular Medical History: Positive for: Congestive Heart Failure; Coronary Artery Disease; Myocardial Infarction Past Medical History Notes: Atrial Fibrillation Endocrine Medical History: Positive for: Type II Diabetes Time with diabetes: 10 years Blood sugar tested every day: No Genitourinary Medical History: Past Medical History Notes: BPH Integumentary (Skin) Medical History: Negative for: History of Burn Musculoskeletal Medical History: Negative for: Gout; Rheumatoid Arthritis; Osteoarthritis; Osteomyelitis Neurologic Medical History: Positive for: Neuropathy Oncologic Medical History: Negative for: Received Chemotherapy; Received Radiation Psychiatric Medical History: Negative for: Anorexia/bulimia; Confinement Anxiety Immunizations Pneumococcal Vaccine: Received Pneumococcal Vaccination: Yes Received Pneumococcal Vaccination On or After 60th Birthday: Yes Implantable Devices Yes Hospitalization / Surgery History Type of Hospitalization/Surgery AICD Family and Social History BOY, DELAMATER ([id number]) [id number] Page 8 of 8 Unknown History: Yes; Cancer: No; Diabetes: Yes - Paternal Grandparents,Maternal Grandparents; Never smoker; Marital Status - Married; Alcohol Use: Rarely; Drug Use: No History; Caffeine Use: Daily; Financial Concerns: No; Food, Clothing or Shelter Needs: No; Support System Lacking: No; Transportation Concerns: No Psychologist, prison and probation services) Signed: 04/26/2022 3:07:17 PM By: Duanne Guess MD FACS Entered By: Duanne Guess on 04/26/2022 14:50:55 -------------------------------------------------------------------------------- SuperBill Details Patient Name: Date of Service: [name], Jeremy Elita Boone. 04/26/2022 Medical Record Number: [id number] Patient Account Number: [id number] Date of Birth/Sex: Treating RN: [birth year]-[name] (88 y.o.  M) Primary Care Provider: Maudie Flakes Other Clinician: Referring Provider: Treating Provider/Extender: Mellody Memos in Treatment: 1 Diagnosis Coding ICD-10 Codes Code Description [phone] Non-pressure chronic ulcer of other part of left foot with fat layer exposed I50.22 Chronic systolic (congestive) heart failure I25.10 Atherosclerotic heart disease of native coronary artery without angina pectoris E11.621 Type 2 diabetes mellitus with foot ulcer N18.9 Chronic kidney disease, unspecified Facility Procedures : CPT4 Code: [id number] Description: 97597 - DEBRIDE WOUND 1ST 20 SQ CM OR < ICD-10 Diagnosis Description L97.522 Non-pressure chronic ulcer of other part of left foot with fat layer exposed Modifier: Quantity: 1 Physician Procedures : CPT4 Code Description Modifier [id number] - WC PHYS LEVEL 3 - EST PT 25 ICD-10 Diagnosis Description L97.522 Non-pressure chronic ulcer of other part of left foot with fat layer exposed I50.22 Chronic systolic (congestive) heart failure E11.621 Type  2 diabetes mellitus with foot ulcer N18.9 Chronic kidney disease, unspecified Quantity: 1 : [id number] 97597 - WC PHYS DEBR WO ANESTH 20 SQ CM ICD-10 Diagnosis Description L97.522 Non-pressure chronic ulcer of other part of left foot with fat layer exposed Quantity: 1 Electronic Signature(s) Signed: 04/26/2022 2:52:39 PM By: Duanne Guess MD FACS Entered By: Duanne Guess on 04/26/2022 14:52:38

## 2022-04-27 NOTE — Progress Notes (Signed)
EMRE, STOCK ([id number]) [id number] Page 1 of 7 Visit Report for 04/26/2022 Arrival Information Details Patient Name: Date of Service: [name], [name] 04/26/2022 2:00 PM Medical Record Number: [id number] Patient Account Number: [id number] Date of Birth/Sex: Treating RN: [birth year] (88 y.o. [name] Primary Care Ammi Hutt: Thereasa Distance Other Clinician: Referring Madix Blowe: Treating Noble Cicalese/Extender: [name] in Treatment: 1 Visit Information History Since Last Visit Added or deleted any medications: No Patient Arrived: [name] Any new allergies or adverse reactions: No Arrival Time: 14:31 Had Jeremy fall or experienced change in No Accompanied By: son activities of daily living that may affect Transfer Assistance: None risk of falls: Patient Identification Verified: Yes Signs or symptoms of abuse/neglect since last visito No Secondary Verification Process Completed: Yes Hospitalized since last visit: No Patient Requires Transmission-Based Precautions: No Implantable device outside of the clinic excluding No Patient Has Alerts: No cellular tissue based products placed in the center since last visit: Has Dressing in Place as Prescribed: Yes Pain Present Now: No Electronic Signature(s) Signed: 04/26/2022 4:57:46 PM By: Adline Peals Entered By: Adline Peals on 04/26/2022 14:33:25 -------------------------------------------------------------------------------- Encounter Discharge Information Details Patient Name: Date of Service: [name], Jeremy LBERT J. 04/26/2022 2:00 PM Medical Record Number: [id number] Patient Account Number: [id number] Date of Birth/Sex: Treating RN: 08/21/1928 (88 y.o. [name] Primary Care Joesph Marcy: Thereasa Distance Other Clinician: Referring Latravion Graves: Treating Ordean Fouts/Extender: [name] in Treatment: 1 Encounter Discharge  Information Items Post Procedure Vitals Discharge Condition: Stable Temperature (F): 97.9 Ambulatory Status: Walker Pulse (bpm): 60 Discharge Destination: Home Respiratory Rate (breaths/min): 18 Transportation: Private Auto Blood Pressure (mmHg): 138/80 Accompanied By: son Schedule Follow-up Appointment: Yes Clinical Summary of Care: Patient Declined Electronic Signature(s) Signed: 04/26/2022 6:06:53 PM By: Baruch Gouty RN, BSN Entered By: Baruch Gouty on 04/26/2022 15:04:27 [name] ([id number]) [id number] Page 2 of 7 -------------------------------------------------------------------------------- Lower Extremity Assessment Details Patient Name: Date of Service: [name], Jeremy LBERT J. 04/26/2022 2:00 PM Medical Record Number: [id number] Patient Account Number: [id number] Date of Birth/Sex: Treating RN: Dec 09, 1928 (88 y.o. [name] Primary Care Ronnie Doo: Thereasa Distance Other Clinician: Referring Aundria Bitterman: Treating Zyair Russi/Extender: [name] in Treatment: 1 Edema Assessment Assessed: Shirlyn Goltz: No] Patrice Paradise: No] [Left: Edema] [Right: :] Calf Left: Right: Point of Measurement: From Medial Instep 33 cm Ankle Left: Right: Point of Measurement: From Medial Instep 20 cm Electronic Signature(s) Signed: 04/26/2022 4:57:46 PM By: Adline Peals Entered By: Adline Peals on 04/26/2022 14:34:02 -------------------------------------------------------------------------------- Multi Wound Chart Details Patient Name: Date of Service: [name], Jeremy LBERT J. 04/26/2022 2:00 PM Medical Record Number: [id number] Patient Account Number: [id number] Date of Birth/Sex: Treating RN: 04/20/1929 (88 y.o. M) Primary Care Ellasyn Swilling: Thereasa Distance Other Clinician: Referring Duston Smolenski: Treating Sandar Krinke/Extender: [name] in Treatment: 1 Vital Signs Height(in): 73 Capillary  Blood Glucose(mg/dl): 141 Weight(lbs): 169.7 Pulse(bpm): 60 Body Mass Index(BMI): 22.4 Blood Pressure(mmHg): 138/80 Temperature(F): 97.9 Respiratory Rate(breaths/min): 18 [1:Photos:] [N/Jeremy:N/Jeremy] Medial T Great oe N/Jeremy N/Jeremy Wound Location: Blister N/Jeremy N/Jeremy Wounding Event: Diabetic Wound/Ulcer of the Lower N/Jeremy N/Jeremy Primary Etiology: Extremity Congestive Heart Failure, Coronary N/Jeremy N/Jeremy Comorbid History: Artery Disease, Myocardial Infarction, [name] ([id number]) [id number] Page 3 of 7 Type II Diabetes, Neuropathy 03/22/2022 N/Jeremy N/Jeremy Date Acquired: 1 N/Jeremy N/Jeremy Weeks of Treatment: Open N/Jeremy N/Jeremy Wound Status: No N/Jeremy N/Jeremy Wound Recurrence: 0.3x0.4x0.2 N/Jeremy N/Jeremy Measurements L x W x D (cm) 0.094 N/Jeremy N/Jeremy Jeremy (cm) : rea  0.019 N/Jeremy N/Jeremy Volume (cm) : 60.20% N/Jeremy N/Jeremy % Reduction in Jeremy rea: 20.80% N/Jeremy N/Jeremy % Reduction in Volume: Grade 2 N/Jeremy N/Jeremy Classification: Small N/Jeremy N/Jeremy Exudate Jeremy mount: Serosanguineous N/Jeremy N/Jeremy Exudate Type: red, brown N/Jeremy N/Jeremy Exudate Color: Distinct, outline attached N/Jeremy N/Jeremy Wound Margin: Small (1-33%) N/Jeremy N/Jeremy Granulation Jeremy mount: Red, Pink N/Jeremy N/Jeremy Granulation Quality: Large (67-100%) N/Jeremy N/Jeremy Necrotic Jeremy mount: Fat Layer (Subcutaneous Tissue): Yes N/Jeremy N/Jeremy Exposed Structures: Fascia: No Tendon: No Muscle: No Joint: No Bone: No Small (1-33%) N/Jeremy N/Jeremy Epithelialization: Debridement - Selective/Open Wound N/Jeremy N/Jeremy Debridement: Pre-procedure Verification/Time Out 14:45 N/Jeremy N/Jeremy Taken: Lidocaine 4% Topical Solution N/Jeremy N/Jeremy Pain Control: Necrotic/Eschar, Slough N/Jeremy N/Jeremy Tissue Debrided: Skin/Epidermis N/Jeremy N/Jeremy Level: 0.12 N/Jeremy N/Jeremy Debridement Jeremy (sq cm): rea Curette N/Jeremy N/Jeremy Instrument: Minimum N/Jeremy N/Jeremy Bleeding: Pressure N/Jeremy N/Jeremy Hemostasis Jeremy chieved: 2 N/Jeremy N/Jeremy Procedural Pain: 1 N/Jeremy N/Jeremy Post Procedural Pain: Procedure was tolerated well N/Jeremy N/Jeremy Debridement Treatment Response: 0.3x0.4x0.2 N/Jeremy N/Jeremy Post Debridement  Measurements L x W x D (cm) 0.019 N/Jeremy N/Jeremy Post Debridement Volume: (cm) Callus: Yes N/Jeremy N/Jeremy Periwound Skin Texture: Dry/Scaly: Yes N/Jeremy N/Jeremy Periwound Skin Moisture: Maceration: No No Abnormalities Noted N/Jeremy N/Jeremy Periwound Skin Color: No Abnormality N/Jeremy N/Jeremy Temperature: Debridement N/Jeremy N/Jeremy Procedures Performed: Treatment Notes Electronic Signature(s) Signed: 04/26/2022 2:50:13 PM By: Fredirick Maudlin MD FACS Entered By: Fredirick Maudlin on 04/26/2022 14:50:13 -------------------------------------------------------------------------------- Multi-Disciplinary Care Plan Details Patient Name: Date of Service: [name], Jeremy LBERT J. 04/26/2022 2:00 PM Medical Record Number: [id number] Patient Account Number: [id number] Date of Birth/Sex: Treating RN: 04/21/1929 (88 y.o. [name] Primary Care Kailie Polus: Thereasa Distance Other Clinician: Referring Jazzman Loughmiller: Treating Bailee Thall/Extender: [name] in Treatment: 1 Active Inactive Wound/Skin Impairment Nursing Diagnoses: Impaired tissue integrity ELGAR, SCOGGINS ([id number]) [id number] Page 4 of 7 Goals: Patient will have Jeremy decrease in wound volume by X% from date: (specify in notes) Date Initiated: 04/18/2022 Date Inactivated: 04/26/2022 Target Resolution Date: 05/16/2022 Goal Status: Met Patient/caregiver will verbalize understanding of skin care regimen Date Initiated: 04/26/2022 Target Resolution Date: 05/17/2022 Goal Status: Active Ulcer/skin breakdown will have Jeremy volume reduction of 30% by week 4 Date Initiated: 04/26/2022 Target Resolution Date: 05/17/2022 Goal Status: Active Interventions: Assess patient/caregiver ability to obtain necessary supplies Assess ulceration(s) every visit Treatment Activities: Skin care regimen initiated : 04/18/2022 Notes: Electronic Signature(s) Signed: 04/26/2022 6:06:53 PM By: Baruch Gouty RN, BSN Entered By:  Baruch Gouty on 04/26/2022 14:49:47 -------------------------------------------------------------------------------- Pain Assessment Details Patient Name: Date of Service: [name], Jeremy LBERT J. 04/26/2022 2:00 PM Medical Record Number: [id number] Patient Account Number: [id number] Date of Birth/Sex: Treating RN: 28-Dec-1928 (88 y.o. [name] Primary Care Shantee Hayne: Thereasa Distance Other Clinician: Referring Sharren Schnurr: Treating Tadarrius Burch/Extender: [name] in Treatment: 1 Active Problems Location of Pain Severity and Description of Pain Patient Has Paino No Site Locations Rate the pain. Current Pain Level: 0 Pain Management and Medication Current Pain Management: Electronic Signature(s) Signed: 04/26/2022 4:57:46 PM By: Adline Peals Entered By: Adline Peals on 04/26/2022 14:33:59 Ficek, Shelda Altes ([id number]) [id number] Page 5 of 7 -------------------------------------------------------------------------------- Patient/Caregiver Education Details Patient Name: Date of Service: [name], [name] 10/25/2023andnbsp2:00 PM Medical Record Number: [id number] Patient Account Number: [id number] Date of Birth/Gender: Treating RN: [birth year] (88 y.o. [name] Primary Care Physician: Thereasa Distance Other Clinician: Referring Physician: Treating Physician/Extender: [name] in Treatment: 1 Education Assessment Education Provided To: Patient Education Topics  Provided Wound/Skin Impairment: Methods: Explain/Verbal Responses: Reinforcements needed, State content correctly Electronic Signature(s) Signed: 04/26/2022 6:06:53 PM By: Baruch Gouty RN, BSN Entered By: Baruch Gouty on 04/26/2022 14:50:16 -------------------------------------------------------------------------------- Wound Assessment Details Patient Name: Date of Service: [name], Jeremy LBERT  J. 04/26/2022 2:00 PM Medical Record Number: [id number] Patient Account Number: [id number] Date of Birth/Sex: Treating RN: Jun 25, 1929 (88 y.o. [name] Primary Care Brien Lowe: Thereasa Distance Other Clinician: Referring Dezirae Service: Treating Avantae Bither/Extender: [name] in Treatment: 1 Wound Status Wound Number: 1 Primary Diabetic Wound/Ulcer of the Lower Extremity Etiology: Wound Location: Medial T Great oe Wound Open Wounding Event: Blister Status: Date Acquired: 03/22/2022 Comorbid Congestive Heart Failure, Coronary Artery Disease, Myocardial Weeks Of Treatment: 1 History: Infarction, Type II Diabetes, Neuropathy Clustered Wound: No Photos Wound Measurements Length: (cm) 0.3 Width: (cm) 0.4 Steuart, Deepak J ([id number]) Depth: (cm) Area: (cm) Volume: (cm) % Reduction in Area: 60.2% % Reduction in Volume: 20.8% [id number] Page 6 of 7 0.2 Epithelialization: Small (1-33%) 0.094 Tunneling: No 0.019 Undermining: No Wound Description Classification: Grade 2 Wound Margin: Distinct, outline attached Exudate Amount: Small Exudate Type: Serosanguineous Exudate Color: red, brown Foul Odor After Cleansing: No Slough/Fibrino Yes Wound Bed Granulation Amount: Small (1-33%) Exposed Structure Granulation Quality: Red, Pink Fascia Exposed: No Necrotic Amount: Large (67-100%) Fat Layer (Subcutaneous Tissue) Exposed: Yes Necrotic Quality: Adherent Slough Tendon Exposed: No Muscle Exposed: No Joint Exposed: No Bone Exposed: No Periwound Skin Texture Texture Color No Abnormalities Noted: No No Abnormalities Noted: Yes Callus: Yes Temperature / Pain Temperature: No Abnormality Moisture No Abnormalities Noted: No Dry / Scaly: Yes Maceration: No Treatment Notes Wound #1 (Toe Great) Wound Laterality: Medial Cleanser Soap and Water Discharge Instruction: May shower and wash wound with dial antibacterial  soap and water prior to dressing change. Peri-Wound Care Sween Lotion (Moisturizing lotion) Discharge Instruction: Apply moisturizing lotion as directed Topical Primary Dressing KerraCel Ag Gelling Fiber Dressing, 2x2 in (silver alginate) Discharge Instruction: Apply silver alginate to wound bed as instructed Optifoam Non-Adhesive Dressing, 4x4 in Discharge Instruction: Apply to wound bed cut to make foam donut Secondary Dressing Secured With Conforming Stretch Gauze Bandage, Sterile 2x75 (in/in) Discharge Instruction: Secure with stretch gauze as directed. Transpore Surgical Tape, 2x10 (in/yd) Discharge Instruction: Secure dressing with tape as directed. Compression Wrap Compression Stockings Add-Ons Electronic Signature(s) Signed: 04/26/2022 4:57:46 PM By: Adline Peals Entered By: Adline Peals on 04/26/2022 14:38:49 HARSHAL, SIRMON ([id number]) [id number] Page 7 of 7 -------------------------------------------------------------------------------- Vitals Details Patient Name: Date of Service: [name], Jeremy LBERT J. 04/26/2022 2:00 PM Medical Record Number: [id number] Patient Account Number: [id number] Date of Birth/Sex: Treating RN: 05-18-1929 (88 y.o. [name] Primary Care Charleton Deyoung: Thereasa Distance Other Clinician: Referring Zakariah Urwin: Treating Dorann Davidson/Extender: [name] in Treatment: 1 Vital Signs Time Taken: 14:33 Temperature (F): 97.9 Height (in): 73 Pulse (bpm): 60 Weight (lbs): 169.7 Respiratory Rate (breaths/min): 18 Body Mass Index (BMI): 22.4 Blood Pressure (mmHg): 138/80 Capillary Blood Glucose (mg/dl): 141 Reference Range: 80 - 120 mg / dl Electronic Signature(s) Signed: 04/26/2022 4:57:46 PM By: Adline Peals Entered By: Adline Peals on 04/26/2022 14:34:52

## 2022-05-04 ENCOUNTER — Encounter (HOSPITAL_BASED_OUTPATIENT_CLINIC_OR_DEPARTMENT_OTHER): Payer: Medicare Other | Attending: General Surgery | Admitting: General Surgery

## 2022-05-04 DIAGNOSIS — N189 Chronic kidney disease, unspecified: Secondary | ICD-10-CM | POA: Diagnosis not present

## 2022-05-04 DIAGNOSIS — J9611 Chronic respiratory failure with hypoxia: Secondary | ICD-10-CM | POA: Insufficient documentation

## 2022-05-04 DIAGNOSIS — E1122 Type 2 diabetes mellitus with diabetic chronic kidney disease: Secondary | ICD-10-CM | POA: Insufficient documentation

## 2022-05-04 DIAGNOSIS — L97522 Non-pressure chronic ulcer of other part of left foot with fat layer exposed: Secondary | ICD-10-CM | POA: Diagnosis present

## 2022-05-04 DIAGNOSIS — Z95810 Presence of automatic (implantable) cardiac defibrillator: Secondary | ICD-10-CM | POA: Diagnosis not present

## 2022-05-04 DIAGNOSIS — E11621 Type 2 diabetes mellitus with foot ulcer: Secondary | ICD-10-CM | POA: Insufficient documentation

## 2022-05-04 DIAGNOSIS — I5022 Chronic systolic (congestive) heart failure: Secondary | ICD-10-CM | POA: Diagnosis not present

## 2022-05-04 DIAGNOSIS — I130 Hypertensive heart and chronic kidney disease with heart failure and stage 1 through stage 4 chronic kidney disease, or unspecified chronic kidney disease: Secondary | ICD-10-CM | POA: Insufficient documentation

## 2022-05-05 NOTE — Progress Notes (Signed)
Frenz, [name] Wallace ([id number]) [id number] Page 1 of 9 Visit Report for 05/04/2022 Chief Complaint Document Details Patient Name: Date of Service: [name], [name] Wallace 05/04/2022 2:00 PM Medical Record Number: [id number] Patient Account Number: [id number] Date of Birth/Sex: Treating RN: Jan 03, 1929 (88 y.o. M) Primary Care Provider: Maudie Wallace Other Clinician: Referring Provider: Treating Provider/Extender: Mellody Memos in Treatment: 2 Information Obtained from: Patient Chief Complaint Patients presents for treatment of an open diabetic ulcer Electronic Signature(s) Signed: 05/04/2022 2:50:26 PM By: Duanne Guess MD FACS Entered By: Duanne Guess on 05/04/2022 14:50:26 -------------------------------------------------------------------------------- Debridement Details Patient Name: Date of Service: [name], [name] 05/04/2022 2:00 PM Medical Record Number: [id number] Patient Account Number: [id number] Date of Birth/Sex: Treating RN: Jun 16, 1929 (88 y.o. [name] Wallace Primary Care Provider: Maudie Wallace Other Clinician: Referring Provider: Treating Provider/Extender: Mellody Memos in Treatment: 2 Debridement Performed for Assessment: Wound #1 Medial T Great oe Performed By: Physician Duanne Guess, MD Debridement Type: Debridement Severity of Tissue Pre Debridement: Fat layer exposed Level of Consciousness (Pre-procedure): Awake and Alert Pre-procedure Verification/Time Out Yes - 14:40 Taken: Start Time: 14:40 Pain Control: Lidocaine 4% T opical Solution T Area Debrided (L x W): otal 0.3 (cm) x 0.3 (cm) = 0.09 (cm) Tissue and other material debrided: Non-Viable, Callus, Eschar, Slough, Slough Level: Non-Viable Tissue Debridement Description: Selective/Open Wound Instrument: Curette Bleeding: Minimum Hemostasis Achieved: Pressure Procedural Pain: 3 Post Procedural  Pain: 1 Response to Treatment: Procedure was tolerated well Level of Consciousness (Post- Awake and Alert procedure): Post Debridement Measurements of Total Wound Length: (cm) 0.3 Width: (cm) 0.3 Depth: (cm) 0.1 Volume: (cm) 0.007 Character of Wound/Ulcer Post Debridement: Improved Severity of Tissue Post Debridement: Fat layer exposed [name] Wallace, [name] Wallace ([id number]) [id number] Page 2 of 9 Post Procedure Diagnosis Same as Pre-procedure Electronic Signature(s) Signed: 05/04/2022 3:43:56 PM By: Duanne Guess MD FACS Signed: 05/04/2022 5:43:43 PM By: Zenaida Deed RN, BSN Entered By: Zenaida Deed on 05/04/2022 14:42:50 -------------------------------------------------------------------------------- HPI Details Patient Name: Date of Service: [name], [name] 05/04/2022 2:00 PM Medical Record Number: [id number] Patient Account Number: [id number] Date of Birth/Sex: Treating RN: Dec 31, 1928 (88 y.o. M) Primary Care Provider: Maudie Wallace Other Clinician: Referring Provider: Treating Provider/Extender: Mellody Memos in Treatment: 2 History of Present Illness HPI Description: ADMISSION 04/18/2022 This is a 88 year old man with a past medical history significant for CHF New [place] Heart Association class III, chronic respiratory failure with hypoxia, type 2 diabetes, AICD, and chronic kidney disease. He recently purchased a new pair of boots and unfortunately, they rubbed blister on his left great toe. Due to neuropathy, patient thought initially for this. Since then, the blister broke down and he now has an ulcer on the dorsal medial aspect of his great toe. His family has been managing it with hydrocolloid dressings and topical triple antibiotic ointment. It had not been improving, so he was referred to the wound care center for further evaluation and management. His family does not check his blood glucose regularly but says  that it is generally around 140 when they do so. ABI in clinic today was 0.76. The last hemoglobin A1c I have available for my review was from February 2022, at which time it was 7.6. There is a circular wound on the dorsal medial aspect of the patient's left great toe. It has a thick lJONATHA, GAGENus slough on the surface. It does not probe to bone. There is no purulent  drainage, erythema, or malodor. 04/26/2022: The wound is a little bit smaller today. There is light slough accumulation over an otherwise clean base. Mild periwound eschar buildup. No concern for infection. 05/04/2022: The wound continues to contract. The orifice has narrowed a bit with buildup of callus and eschar. The wound base is pink. Electronic Signature(s) Signed: 05/04/2022 2:51:43 PM By: Duanne Guess MD FACS Previous Signature: 05/04/2022 2:50:59 PM Version By: Duanne Guess MD FACS Entered By: Duanne Guess on 05/04/2022 14:51:43 -------------------------------------------------------------------------------- Physical Exam Details Patient Name: Date of Service: [name], [name] 05/04/2022 2:00 PM Medical Record Number: [id number] Patient Account Number: [id number] Date of Birth/Sex: Treating RN: [birth year] (88 y.o. M) Primary Care Provider: Maudie Wallace Other Clinician: Referring Provider: Treating Provider/Extender: Mellody Memos in Treatment: 2 Constitutional Hypertensive, asymptomatic. . . . No acute distress.Marland Kitchen Respiratory Normal work of breathing on room air.. Notes 05/04/2022: The wound continues to contract. The orifice has narrowed a bit with buildup of callus and eschar. The wound base is pink. [name] Wallace, [name] Wallace ([id number]) [id number] Page 3 of 9 Electronic Signature(s) Signed: 05/04/2022 2:52:13 PM By: Duanne Guess MD FACS Entered By: Duanne Guess on 05/04/2022  14:52:13 -------------------------------------------------------------------------------- Physician Orders Details Patient Name: Date of Service: [name], [name] 05/04/2022 2:00 PM Medical Record Number: [id number] Patient Account Number: [id number] Date of Birth/Sex: Treating RN: 02-16-1929 (88 y.o. [name] Wallace Primary Care Provider: Maudie Wallace Other Clinician: Referring Provider: Treating Provider/Extender: Mellody Memos in Treatment: 2 Verbal / Phone Orders: No Diagnosis Coding ICD-10 Coding Code Description [phone] Non-pressure chronic ulcer of other part of left foot with fat layer exposed I50.22 Chronic systolic (congestive) heart failure I25.10 Atherosclerotic heart disease of native coronary artery without angina pectoris E11.621 Type 2 diabetes mellitus with foot ulcer N18.9 Chronic kidney disease, unspecified Follow-up Appointments ppointment in 2 weeks. - Dr. Lady Gary RM 1 Return A Anesthetic Wound #1 Medial T Great oe (In clinic) Topical Lidocaine 4% applied to wound bed - in clinic, prior to debridement Bathing/ Shower/ Hygiene May shower and wash wound with soap and water. - On days when dressing is changed, every other day Edema Control - Lymphedema / SCD / Other Elevate legs to the level of the heart or above for 30 minutes daily and/or when sitting, a frequency of: Avoid standing for long periods of time. Moisturize legs daily. Wound Treatment Wound #1 - T Great oe Wound Laterality: Medial Cleanser: Soap and Water Every Other Day/30 Days Discharge Instructions: May shower and wash wound with dial antibacterial soap and water prior to dressing change. Peri-Wound Care: Sween Lotion (Moisturizing lotion) Every Other Day/30 Days Discharge Instructions: Apply moisturizing lotion as directed Prim Dressing: KerraCel Ag Gelling Fiber Dressing, 2x2 in (silver alginate) (Generic) Every Other Day/30 Days ary Discharge  Instructions: Apply silver alginate to wound bed as instructed Prim Dressing: Optifoam Non-Adhesive Dressing, 4x4 in (Generic) Every Other Day/30 Days ary Discharge Instructions: Apply to wound bed cut to make foam donut Secured With: Conforming Stretch Gauze Bandage, Sterile 2x75 (in/in) Every Other Day/30 Days Discharge Instructions: Secure with stretch gauze as directed. Secured With: Transpore Surgical Tape, 2x10 (in/yd) (Generic) Every Other Day/30 Days Discharge Instructions: Secure dressing with tape as directed. Electronic Signature(s) Signed: 05/04/2022 3:43:56 PM By: Duanne Guess MD FACS Entered By: Duanne Guess on 05/04/2022 14:52:27 [name] Wallace, [name] Wallace ([id number]) [id number] Page 4 of 9 -------------------------------------------------------------------------------- Problem List Details Patient Name: Date of Service: [name], [name]  05/04/2022 2:00 PM Medical Record Number: [id number] Patient Account Number: [id number] Date of Birth/Sex: Treating RN: 03/16/1929 (88 y.o. [name] Wallace Primary Care Provider: Maudie Wallace Other Clinician: Referring Provider: Treating Provider/Extender: Mellody Memos in Treatment: 2 Active Problems ICD-10 Encounter Code Description Active Date MDM Diagnosis [phone] Non-pressure chronic ulcer of other part of left foot with fat layer exposed 04/18/2022 No Yes I50.22 Chronic systolic (congestive) heart failure 04/18/2022 No Yes I25.10 Atherosclerotic heart disease of native coronary artery without angina pectoris 04/18/2022 No Yes E11.621 Type 2 diabetes mellitus with foot ulcer 04/18/2022 No Yes N18.9 Chronic kidney disease, unspecified 04/18/2022 No Yes Inactive Problems Resolved Problems Electronic Signature(s) Signed: 05/04/2022 2:50:14 PM By: Duanne Guess MD FACS Entered By: Duanne Guess on 05/04/2022  14:50:14 -------------------------------------------------------------------------------- Progress Note Details Patient Name: Date of Service: [name], [name] 05/04/2022 2:00 PM Medical Record Number: [id number] Patient Account Number: [id number] Date of Birth/Sex: Treating RN: [birth year] (88 y.o. M) Primary Care Provider: Maudie Wallace Other Clinician: Referring Provider: Treating Provider/Extender: Mellody Memos in Treatment: 2 Subjective Chief Complaint Information obtained from Patient Patients presents for treatment of an open diabetic ulcer [name] Wallace, [name] Wallace ([id number]) [id number] Page 5 of 9 History of Present Illness (HPI) ADMISSION 04/18/2022 This is a 88 year old man with a past medical history significant for CHF New [place] Heart Association class III, chronic respiratory failure with hypoxia, type 2 diabetes, AICD, and chronic kidney disease. He recently purchased a new pair of boots and unfortunately, they rubbed blister on his left great toe. Due to neuropathy, patient thought initially for this. Since then, the blister broke down and he now has an ulcer on the dorsal medial aspect of his great toe. His family has been managing it with hydrocolloid dressings and topical triple antibiotic ointment. It had not been improving, so he was referred to the wound care center for further evaluation and management. His family does not check his blood glucose regularly but says that it is generally around 140 when they do so. ABI in clinic today was 0.76. The last hemoglobin A1c I have available for my review was from February 2022, at which time it was 7.6. There is a circular wound on the dorsal medial aspect of the patient's left great toe. It has a thick layer of fibrinous slough on the surface. It does not probe to bone. There is no purulent drainage, erythema, or malodor. 04/26/2022: The wound is a little bit smaller  today. There is light slough accumulation over an otherwise clean base. Mild periwound eschar buildup. No concern for infection. 05/04/2022: The wound continues to contract. The orifice has narrowed a bit with buildup of callus and eschar. The wound base is pink. Patient History Family History Unknown History, Diabetes - Paternal Grandparents,Maternal Grandparents, No family history of Cancer. Social History Never smoker, Marital Status - Married, Alcohol Use - Rarely, Drug Use - No History, Caffeine Use - Daily. Medical History Eyes Denies history of Cataracts, Glaucoma, Optic Neuritis Ear/Nose/Mouth/Throat Denies history of Chronic sinus problems/congestion, Middle ear problems Hematologic/Lymphatic Denies history of Anemia, Hemophilia, Human Immunodeficiency Virus, Lymphedema, Sickle Cell Disease Respiratory Denies history of Aspiration, Asthma, Chronic Obstructive Pulmonary Disease (COPD), Pneumothorax, Sleep Apnea, Tuberculosis Cardiovascular Patient has history of Congestive Heart Failure, Coronary Artery Disease, Myocardial Infarction Endocrine Patient has history of Type II Diabetes Integumentary (Skin) Denies history of History of Burn Musculoskeletal Denies history of Gout, Rheumatoid Arthritis, Osteoarthritis, Osteomyelitis Neurologic Patient has history of Neuropathy Oncologic  Denies history of Received Chemotherapy, Received Radiation Psychiatric Denies history of Anorexia/bulimia, Confinement Anxiety Hospitalization/Surgery History - AICD. Medical A Surgical History Notes nd Cardiovascular Atrial Fibrillation Genitourinary BPH Objective Constitutional Hypertensive, asymptomatic. No acute distress.. Vitals Time Taken: 2:00 PM, Height: 73 in, Weight: 169.7 lbs, BMI: 22.4, Pulse: 60 bpm, Respiratory Rate: 18 breaths/min, Blood Pressure: 150/87 mmHg, Capillary Blood Glucose: 136 mg/dl. Respiratory Normal work of breathing on room air.. General Notes: 05/04/2022:  The wound continues to contract. The orifice has narrowed a bit with buildup of callus and eschar. The wound base is pink. Integumentary (Hair, Skin) Wound #1 status is Open. Original cause of wound was Blister. The date acquired was: 03/22/2022. The wound has been in treatment 2 weeks. The wound is located on the Medial T Great. The wound measures 0.2cm length x 0.2cm width x 0.1cm depth; 0.031cm^2 area and 0.003cm^3 volume. There is Fat Layer oe (Subcutaneous Tissue) exposed. There is no tunneling or undermining noted. There is a small amount of serosanguineous drainage noted. The wound margin is distinct with the outline attached to the wound base. There is medium (34-66%) pink granulation within the wound bed. There is a medium (34-66%) amount of necrotic tissue within the wound bed including Adherent Slough. The periwound skin appearance had no abnormalities noted for color. The periwound skin appearance exhibited: Callus, Dry/Scaly. The periwound skin appearance did not exhibit: Maceration. Periwound temperature was noted as No Abnormality. [name] Wallace, [name] Wallace ([id number]) [id number] Page 6 of 9 Assessment Active Problems ICD-10 Non-pressure chronic ulcer of other part of left foot with fat layer exposed Chronic systolic (congestive) heart failure Atherosclerotic heart disease of native coronary artery without angina pectoris Type 2 diabetes mellitus with foot ulcer Chronic kidney disease, unspecified Procedures Wound #1 Pre-procedure diagnosis of Wound #1 is a Diabetic Wound/Ulcer of the Lower Extremity located on the Medial T Great .Severity of Tissue Pre Debridement oe is: Fat layer exposed. There was a Selective/Open Wound Non-Viable Tissue Debridement with a total area of 0.09 sq cm performed by [name] Maudlin, MD. With the following instrument(s): Curette to remove Non-Viable tissue/material. Material removed includes Eschar, Callus, and Slough after  achieving pain control using Lidocaine 4% Topical Solution. No specimens were taken. A time out was conducted at 14:40, prior to the start of the procedure. A Minimum amount of bleeding was controlled with Pressure. The procedure was tolerated well with a pain level of 3 throughout and a pain level of 1 following the procedure. Post Debridement Measurements: 0.3cm length x 0.3cm width x 0.1cm depth; 0.007cm^3 volume. Character of Wound/Ulcer Post Debridement is improved. Severity of Tissue Post Debridement is: Fat layer exposed. Post procedure Diagnosis Wound #1: Same as Pre-Procedure Plan Follow-up Appointments: Return Appointment in 2 weeks. - Dr. Celine Ahr RM 1 Anesthetic: Wound #1 Medial T Great: oe (In clinic) Topical Lidocaine 4% applied to wound bed - in clinic, prior to debridement Bathing/ Shower/ Hygiene: May shower and wash wound with soap and water. - On days when dressing is changed, every other day Edema Control - Lymphedema / SCD / Other: Elevate legs to the level of the heart or above for 30 minutes daily and/or when sitting, a frequency of: Avoid standing for long periods of time. Moisturize legs daily. WOUND #1: - T Great Wound Laterality: Medial oe Cleanser: Soap and Water Every Other Day/30 Days Discharge Instructions: May shower and wash wound with dial antibacterial soap and water prior to dressing change. Peri-Wound Care: Sween Lotion (Moisturizing lotion) Every Other Day/30  Days Discharge Instructions: Apply moisturizing lotion as directed Prim Dressing: KerraCel Ag Gelling Fiber Dressing, 2x2 in (silver alginate) (Generic) Every Other Day/30 Days ary Discharge Instructions: Apply silver alginate to wound bed as instructed Prim Dressing: Optifoam Non-Adhesive Dressing, 4x4 in (Generic) Every Other Day/30 Days ary Discharge Instructions: Apply to wound bed cut to make foam donut Secured With: Conforming Stretch Gauze Bandage, Sterile 2x75 (in/in) Every Other Day/30  Days Discharge Instructions: Secure with stretch gauze as directed. Secured With: Transpore Surgical T ape, 2x10 (in/yd) (Generic) Every Other Day/30 Days Discharge Instructions: Secure dressing with tape as directed. 05/04/2022: The wound continues to contract. The orifice has narrowed a bit with buildup of callus and eschar. The wound base is pink. I used a curette to reopen the wound orifice, removing callus, eschar, and some slough from the wound surface. We will continue to use silver alginate with a foam doughnut. Follow-up in 2 weeks. Electronic Signature(s) Signed: 05/04/2022 2:53:13 PM By: Duanne Guess MD FACS Entered By: Duanne Guess on 05/04/2022 14:53:13 [name] Wallace ([id number]) [id number] Page 7 of 9 -------------------------------------------------------------------------------- HxROS Details Patient Name: Date of Service: [name], [name] 05/04/2022 2:00 PM Medical Record Number: [id number] Patient Account Number: [id number] Date of Birth/Sex: Treating RN: Mar 17, 1929 (88 y.o. M) Primary Care Provider: Maudie Wallace Other Clinician: Referring Provider: Treating Provider/Extender: Mellody Memos in Treatment: 2 Eyes Medical History: Negative for: Cataracts; Glaucoma; Optic Neuritis Ear/Nose/Mouth/Throat Medical History: Negative for: Chronic sinus problems/congestion; Middle ear problems Hematologic/Lymphatic Medical History: Negative for: Anemia; Hemophilia; Human Immunodeficiency Virus; Lymphedema; Sickle Cell Disease Respiratory Medical History: Negative for: Aspiration; Asthma; Chronic Obstructive Pulmonary Disease (COPD); Pneumothorax; Sleep Apnea; Tuberculosis Cardiovascular Medical History: Positive for: Congestive Heart Failure; Coronary Artery Disease; Myocardial Infarction Past Medical History Notes: Atrial Fibrillation Endocrine Medical History: Positive for: Type II Diabetes Time  with diabetes: 10 years Blood sugar tested every day: No Genitourinary Medical History: Past Medical History Notes: BPH Integumentary (Skin) Medical History: Negative for: History of Burn Musculoskeletal Medical History: Negative for: Gout; Rheumatoid Arthritis; Osteoarthritis; Osteomyelitis Neurologic Medical History: Positive for: Neuropathy Oncologic Medical History: Negative for: Received Chemotherapy; Received Radiation Psychiatric Medical History: [name] Wallace, [name] Wallace ([id number]) [id number] Page 8 of 9 Negative for: Anorexia/bulimia; Confinement Anxiety Immunizations Pneumococcal Vaccine: Received Pneumococcal Vaccination: Yes Received Pneumococcal Vaccination On or After 60th Birthday: Yes Implantable Devices Yes Hospitalization / Surgery History Type of Hospitalization/Surgery AICD Family and Social History Unknown History: Yes; Cancer: No; Diabetes: Yes - Paternal Grandparents,Maternal Grandparents; Never smoker; Marital Status - Married; Alcohol Use: Rarely; Drug Use: No History; Caffeine Use: Daily; Financial Concerns: No; Food, Clothing or Shelter Needs: No; Support System Lacking: No; Transportation Concerns: No Electronic Signature(s) Signed: 05/04/2022 3:43:56 PM By: Duanne Guess MD FACS Entered By: Duanne Guess on 05/04/2022 14:51:48 -------------------------------------------------------------------------------- SuperBill Details Patient Name: Date of Service: [name], [name] 05/04/2022 Medical Record Number: [id number] Patient Account Number: [id number] Date of Birth/Sex: Treating RN: [birth year] (88 y.o. M) Primary Care Provider: Maudie Wallace Other Clinician: Referring Provider: Treating Provider/Extender: Mellody Memos in Treatment: 2 Diagnosis Coding ICD-10 Codes Code Description [phone] Non-pressure chronic ulcer of other part of left foot with fat layer exposed I50.22 Chronic  systolic (congestive) heart failure I25.10 Atherosclerotic heart disease of native coronary artery without angina pectoris E11.621 Type 2 diabetes mellitus with foot ulcer N18.9 Chronic kidney disease, unspecified Facility Procedures : CPT4 Code: [id number] Description: 97597 - DEBRIDE WOUND 1ST 20 SQ CM OR < ICD-10 Diagnosis  Description L97.522 Non-pressure chronic ulcer of other part of left foot with fat layer exposed Modifier: Quantity: 1 Physician Procedures : CPT4 Code Description Modifier [id number] 99213 - WC PHYS LEVEL 3 - EST PT 25 ICD-10 Diagnosis Description L97.522 Non-pressure chronic ulcer of other part of left foot with fat layer exposed I50.22 Chronic systolic (congestive) heart failure E11.621 Type  2 diabetes mellitus with foot ulcer I25.10 Atherosclerotic heart disease of native coronary artery without angina pectoris Quantity: 1 : [id number] 97597 - WC PHYS DEBR WO ANESTH 20 SQ CM ICD-10 Diagnosis Description L97.522 Non-pressure chronic ulcer of other part of left foot with fat layer exposed [name] KeensBRADFORD, [name] Wallace ([id number]) [id number] Quantity: 1 Page 9 of 9 Electronic Signature(s) Signed: 05/04/2022 2:53:31 PM By: Duanne Guessannon, Arshi Duarte MD FACS Entered By: Duanne Guessannon, Tyeesha Riker on 05/04/2022 14:53:31

## 2022-05-05 NOTE — Progress Notes (Signed)
NOX, TALENT ([id number]) [id number] Page 1 of 7 Visit Report for 05/04/2022 Arrival Information Details Patient Name: Date of Service: [name], Wallace Wallace 05/04/2022 2:00 PM Medical Record Number: [id number] Patient Account Number: [id number] Date of Birth/Sex: Treating RN: 12/25/1928 (88 y.o. M) Primary Care Wallace Wallace: Thereasa Distance Other Clinician: Referring Wallace Wallace: Treating Wallace Wallace/Extender: Wallace Wallace in Treatment: 2 Visit Information History Since Last Visit All ordered tests and consults were completed: No Patient Arrived: Wallace Wallace Added or deleted any medications: No Arrival Time: 14:06 Any new allergies or adverse reactions: No Accompanied By: son Had Wallace fall or experienced change in No Transfer Assistance: None activities of daily living that may affect Patient Identification Verified: Yes risk of falls: Secondary Verification Process Completed: Yes Signs or symptoms of abuse/neglect since last visito No Patient Requires Transmission-Based Precautions: No Hospitalized since last visit: No Patient Has Alerts: No Implantable device outside of the clinic excluding No cellular tissue based products placed in the center since last visit: Pain Present Now: No Electronic Signature(s) Signed: 05/04/2022 3:30:06 PM By: Wallace Wallace Entered By: Wallace Wallace on 05/04/2022 14:07:31 -------------------------------------------------------------------------------- Encounter Discharge Information Details Patient Name: Date of Service: [name], Wallace LBERT J. 05/04/2022 2:00 PM Medical Record Number: [id number] Patient Account Number: [id number] Date of Birth/Sex: Treating RN: 10-12-1928 (88 y.o. Wallace Wallace Primary Care Wallace Wallace: Thereasa Distance Other Clinician: Referring Khamani Fairley: Treating Wallace Wallace/Extender: Wallace Wallace in Treatment: 2 Encounter Discharge Information Items Post  Procedure Vitals Discharge Condition: Stable Temperature (F): 97.6 Ambulatory Status: Walker Pulse (bpm): 50 Discharge Destination: Home Respiratory Rate (breaths/min): 18 Transportation: Private Auto Blood Pressure (mmHg): 150/87 Accompanied By: son Schedule Follow-up Appointment: Yes Clinical Summary of Care: Patient Declined Electronic Signature(s) Signed: 05/04/2022 5:43:43 PM By: Wallace Gouty RN, BSN Entered By: Wallace Wallace on 05/04/2022 14:53:13 Wallace Wallace ([id number]) [id number] Page 2 of 7 -------------------------------------------------------------------------------- Lower Extremity Assessment Details Patient Name: Date of Service: [name], Wallace LBERT J. 05/04/2022 2:00 PM Medical Record Number: [id number] Patient Account Number: [id number] Date of Birth/Sex: Treating RN: 24-May-1929 (88 y.o. Wallace Wallace Primary Care Wallace Wallace: Thereasa Distance Other Clinician: Referring Ananda Sitzer: Treating Wallace Wallace/Extender: Wallace Wallace in Treatment: 2 Edema Assessment Assessed: Wallace Wallace: No] Wallace Wallace: No] [Left: Edema] [Right: :] Calf Left: Right: Point of Measurement: From Medial Instep 33 cm Ankle Left: Right: Point of Measurement: From Medial Instep 20 cm Vascular Assessment Pulses: Dorsalis Pedis Palpable: [Left:Yes] Electronic Signature(s) Signed: 05/04/2022 5:43:43 PM By: Wallace Gouty RN, BSN Entered By: Wallace Wallace on 05/04/2022 14:29:16 -------------------------------------------------------------------------------- Multi Wound Chart Details Patient Name: Date of Service: [name], Wallace LBERT J. 05/04/2022 2:00 PM Medical Record Number: [id number] Patient Account Number: [id number] Date of Birth/Sex: Treating RN: December 19, 1928 (88 y.o. M) Primary Care Wallace Wallace: Thereasa Distance Other Clinician: Referring Wallace Wallace: Treating Wallace Wallace/Extender: Wallace Wallace in Treatment:  2 Vital Signs Height(in): 73 Capillary Blood Glucose(mg/dl): 136 Weight(lbs): 169.7 Pulse(bpm): 60 Body Mass Index(BMI): 22.4 Blood Pressure(mmHg): 150/87 Temperature(F): Respiratory Rate(breaths/min): 18 [1:Photos:] [N/Wallace:N/Wallace] Medial T [place] oe N/Wallace N/Wallace Wound Location: Blister N/Wallace N/Wallace Wounding Event: Diabetic Wound/Ulcer of the Lower N/Wallace N/Wallace Primary Etiology: Extremity Congestive Heart Failure, Coronary N/Wallace N/Wallace Comorbid History: Artery Disease, Myocardial Infarction, Type II Diabetes, Neuropathy 03/22/2022 N/Wallace N/Wallace Date Acquired: 2 N/Wallace N/Wallace Weeks of Treatment: Open N/Wallace N/Wallace Wound Status: No N/Wallace N/Wallace Wound Recurrence: 0.2x0.2x0.1 N/Wallace N/Wallace Measurements L x W x D (cm) 0.031 N/Wallace N/Wallace Wallace (cm) : rea 0.003 N/Wallace  N/Wallace Volume (cm) : 86.90% N/Wallace N/Wallace % Reduction in Wallace rea: 87.50% N/Wallace N/Wallace % Reduction in Volume: Grade 2 N/Wallace N/Wallace Classification: Small N/Wallace N/Wallace Exudate Wallace mount: Serosanguineous N/Wallace N/Wallace Exudate Type: red, brown N/Wallace N/Wallace Exudate Color: Distinct, outline attached N/Wallace N/Wallace Wound Margin: Medium (34-66%) N/Wallace N/Wallace Granulation Wallace mount: Pink N/Wallace N/Wallace Granulation Quality: Medium (34-66%) N/Wallace N/Wallace Necrotic Wallace mount: Fat Layer (Subcutaneous Tissue): Yes N/Wallace N/Wallace Exposed Structures: Fascia: No Tendon: No Muscle: No Joint: No Bone: No Small (1-33%) N/Wallace N/Wallace Epithelialization: Debridement - Selective/Open Wound N/Wallace N/Wallace Debridement: Pre-procedure Verification/Time Out 14:40 N/Wallace N/Wallace Taken: Lidocaine 4% Topical Solution N/Wallace N/Wallace Pain Control: Necrotic/Eschar, Callus, Slough N/Wallace N/Wallace Tissue Debrided: Non-Viable Tissue N/Wallace N/Wallace Level: 0.09 N/Wallace N/Wallace Debridement Wallace (sq cm): rea Curette N/Wallace N/Wallace Instrument: Minimum N/Wallace N/Wallace Bleeding: Pressure N/Wallace N/Wallace Hemostasis Wallace chieved: 3 N/Wallace N/Wallace Procedural Pain: 1 N/Wallace N/Wallace Post Procedural Pain: Procedure was tolerated well N/Wallace N/Wallace Debridement Treatment Response: 0.3x0.3x0.1 N/Wallace N/Wallace Post Debridement Measurements L x W x D (cm) 0.007 N/Wallace  N/Wallace Post Debridement Volume: (cm) Callus: Yes N/Wallace N/Wallace Periwound Skin Texture: Dry/Scaly: Yes N/Wallace N/Wallace Periwound Skin Moisture: Maceration: No No Abnormalities Noted N/Wallace N/Wallace Periwound Skin Color: No Abnormality N/Wallace N/Wallace Temperature: Debridement N/Wallace N/Wallace Procedures Performed: Treatment Notes Electronic Signature(s) Signed: 05/04/2022 2:50:20 PM By: Fredirick Maudlin MD FACS Entered By: Fredirick Maudlin on 05/04/2022 14:50:20 -------------------------------------------------------------------------------- Multi-Disciplinary Care Plan Details Patient Name: Date of Service: [name], Wallace LBERT J. 05/04/2022 2:00 PM Medical Record Number: [id number] Patient Account Number: [id number] Date of Birth/Sex: Treating RN: 12-11-1928 (88 y.o. Wallace Wallace Primary Care Reizel Calzada: Thereasa Distance Other Clinician: Referring Dayne Dekay: Treating Marelin Tat/Extender: Wallace Wallace in Treatment: 2 [place] reviewed with physician Wallace Wallace, Wallace Wallace ([id number]) [id number] Page 4 of 7 Active Inactive Wound/Skin Impairment Nursing Diagnoses: Impaired tissue integrity Goals: Patient will have Wallace decrease in wound volume by X% from date: (specify in notes) Date Initiated: 04/18/2022 Date Inactivated: 04/26/2022 Target Resolution Date: 05/16/2022 Goal Status: Met Patient/caregiver will verbalize understanding of skin care regimen Date Initiated: 04/26/2022 Target Resolution Date: 05/17/2022 Goal Status: Active Ulcer/skin breakdown will have Wallace volume reduction of 30% by week 4 Date Initiated: 04/26/2022 Target Resolution Date: 05/17/2022 Goal Status: Active Interventions: Assess patient/caregiver ability to obtain necessary supplies Assess ulceration(s) every visit Treatment Activities: Skin care regimen initiated : 04/18/2022 Notes: Electronic Signature(s) Signed: 05/04/2022 5:43:43 PM By: Wallace Gouty RN, BSN Entered  By: Wallace Wallace on 05/04/2022 14:33:56 -------------------------------------------------------------------------------- Pain Assessment Details Patient Name: Date of Service: [name], Wallace LBERT J. 05/04/2022 2:00 PM Medical Record Number: [id number] Patient Account Number: [id number] Date of Birth/Sex: Treating RN: March 01, 1929 (88 y.o. M) Primary Care Tayte Mcwherter: Thereasa Distance Other Clinician: Referring Axten Pascucci: Treating Wallace Wallace/Extender: Wallace Wallace in Treatment: 2 Active Problems Location of Pain Severity and Description of Pain Patient Has Paino No Site Locations With Dressing Change: Yes Duration of the Pain. Constant / Intermittento Intermittent Rate the pain. Current Pain Level: 0 Worst Pain Level: 4 Least Pain Level: 0 Character of Pain Describe the Pain: Throbbing Pain Management and Medication Wallace Wallace, Wallace Wallace ([id number]) [id number] Page 5 of 7 Current Pain Management: Other: time, reposition Is the Current Pain Management Adequate: Adequate How does your wound impact your activities of daily livingo Sleep: No Bathing: No Appetite: No Relationship With Others: No Bladder Continence: No Emotions: No Bowel Continence: No Work: No Toileting: No Drive: No Dressing: No Hobbies: No Electronic  Signature(s) Signed: 05/04/2022 5:43:43 PM By: Wallace Gouty RN, BSN Entered By: Wallace Wallace on 05/04/2022 14:29:07 -------------------------------------------------------------------------------- Patient/Caregiver Education Details Patient Name: Date of Service: [name], Wallace Wallace 11/2/2023andnbsp2:00 PM Medical Record Number: [id number] Patient Account Number: [id number] Date of Birth/Gender: Treating RN: [birth year] (88 y.o. Wallace Wallace Primary Care Physician: Thereasa Distance Other Clinician: Referring Physician: Treating Physician/Extender: Wallace Wallace in Treatment:  2 Education Assessment Education Provided To: Patient Education Topics Provided Offloading: Methods: Explain/Verbal Responses: Reinforcements needed, State content correctly Wound/Skin Impairment: Methods: Explain/Verbal Responses: Reinforcements needed, State content correctly Electronic Signature(s) Signed: 05/04/2022 5:43:43 PM By: Wallace Gouty RN, BSN Entered By: Wallace Wallace on 05/04/2022 14:34:26 -------------------------------------------------------------------------------- Wound Assessment Details Patient Name: Date of Service: [name], Wallace LBERT J. 05/04/2022 2:00 PM Medical Record Number: [id number] Patient Account Number: [id number] Date of Birth/Sex: Treating RN: 12/27/1928 (88 y.o. M) Primary Care Leenah Seidner: Thereasa Distance Other Clinician: Referring Wallace Wallace: Treating Wallace Wallace/Extender: Wallace Wallace in Treatment: 2 Wound Status Wound Number: 1 Primary Diabetic Wound/Ulcer of the Lower Extremity Etiology: Wallace Wallace, Wallace Wallace ([id number]) [id number] Page 6 of 7 Etiology: Wound Location: Medial T [place] oe Wound Open Wounding Event: Blister Status: Date Acquired: 03/22/2022 Comorbid Congestive Heart Failure, Coronary Artery Disease, Myocardial Weeks Of Treatment: 2 History: Infarction, Type II Diabetes, Neuropathy Clustered Wound: No Photos Wound Measurements Length: (cm) 0.2 Width: (cm) 0.2 Depth: (cm) 0.1 Area: (cm) 0.031 Volume: (cm) 0.003 % Reduction in Area: 86.9% % Reduction in Volume: 87.5% Epithelialization: Small (1-33%) Tunneling: No Undermining: No Wound Description Classification: Grade 2 Wound Margin: Distinct, outline attached Exudate Amount: Small Exudate Type: Serosanguineous Exudate Color: red, brown Foul Odor After Cleansing: No Slough/Fibrino Yes Wound Bed Granulation Amount: Medium (34-66%) Exposed Structure Granulation Quality: Pink Fascia Exposed: No Necrotic  Amount: Medium (34-66%) Fat Layer (Subcutaneous Tissue) Exposed: Yes Necrotic Quality: Adherent Slough Tendon Exposed: No Muscle Exposed: No Joint Exposed: No Bone Exposed: No Periwound Skin Texture Texture Color No Abnormalities Noted: No No Abnormalities Noted: Yes Callus: Yes Temperature / Pain Temperature: No Abnormality Moisture No Abnormalities Noted: No Dry / Scaly: Yes Maceration: No Treatment Notes Wound #1 (Toe Great) Wound Laterality: Medial Cleanser Soap and Water Discharge Instruction: May shower and wash wound with dial antibacterial soap and water prior to dressing change. Peri-Wound Care Sween Lotion (Moisturizing lotion) Discharge Instruction: Apply moisturizing lotion as directed Topical Primary Dressing KerraCel Ag Gelling Fiber Dressing, 2x2 in (silver alginate) Discharge Instruction: Apply silver alginate to wound bed as instructed Optifoam Non-Adhesive Dressing, 4x4 in Discharge Instruction: Apply to wound bed cut to make foam donut Secondary Dressing MAXEY, Wallace Wallace ([id number]) [id number] Page 7 of 7 Secured With Conforming Stretch Gauze Bandage, Sterile 2x75 (in/in) Discharge Instruction: Secure with stretch gauze as directed. Transpore Surgical Tape, 2x10 (in/yd) Discharge Instruction: Secure dressing with tape as directed. Compression Wrap Compression Stockings Add-Ons Electronic Signature(s) Signed: 05/04/2022 5:43:43 PM By: Wallace Gouty RN, BSN Entered By: Wallace Wallace on 05/04/2022 14:29:53 -------------------------------------------------------------------------------- Vitals Details Patient Name: Date of Service: [name], Wallace LBERT J. 05/04/2022 2:00 PM Medical Record Number: [id number] Patient Account Number: [id number] Date of Birth/Sex: Treating RN: 03-26-1929 (88 y.o. M) Primary Care Shenaya Lebo: Thereasa Distance Other Clinician: Referring Paton Crum: Treating Marcelles Clinard/Extender: Wallace Wallace in Treatment: 2 Vital Signs Time Taken: 14:00 Pulse (bpm): 60 Height (in): 73 Respiratory Rate (breaths/min): 18 Weight (lbs): 169.7 Blood Pressure (mmHg): 150/87 Body Mass Index (BMI): 22.4 Capillary Blood Glucose (mg/dl): 136 Reference Range:  80 - 120 mg / dl Electronic Signature(s) Signed: 05/04/2022 3:30:06 PM By: Wallace Wallace Entered By: Wallace Wallace on 05/04/2022 14:08:01

## 2022-05-18 ENCOUNTER — Encounter (HOSPITAL_BASED_OUTPATIENT_CLINIC_OR_DEPARTMENT_OTHER): Payer: Medicare Other | Admitting: General Surgery

## 2022-05-18 DIAGNOSIS — E11621 Type 2 diabetes mellitus with foot ulcer: Secondary | ICD-10-CM | POA: Diagnosis not present

## 2022-05-18 NOTE — Progress Notes (Signed)
NASSIM, COSMA ([id number]) [id number] Page 1 of 7 Visit Report for 05/18/2022 Chief Complaint Document Details Patient Name: Date of Service: [name], [name] 05/18/2022 1:15 PM Medical Record Number: [id number] Patient Account Number: [id number] Date of Birth/Sex: Treating RN: [birth year] (88 y.o. M) Primary Care Provider: Maudie Flakes Other Clinician: Referring Provider: Treating Provider/Extender: Mellody Memos in Treatment: 4 Information Obtained from: Patient Chief Complaint Patients presents for treatment of an open diabetic ulcer Electronic Signature(s) Signed: 05/18/2022 2:12:26 PM By: Duanne Guess MD FACS Entered By: Duanne Guess on 05/18/2022 14:12:25 -------------------------------------------------------------------------------- HPI Details Patient Name: Date of Service: [name], Jeremy LBERT Wallace. 05/18/2022 1:15 PM Medical Record Number: [id number] Patient Account Number: [id number] Date of Birth/Sex: Treating RN: 1929/04/20 (88 y.o. M) Primary Care Provider: Maudie Flakes Other Clinician: Referring Provider: Treating Provider/Extender: Mellody Memos in Treatment: 4 History of Present Illness HPI Description: ADMISSION 04/18/2022 This is Jeremy 88 year old man with Jeremy past medical history significant for CHF New [place] Heart Association class III, chronic respiratory failure with hypoxia, type 2 diabetes, AICD, and chronic kidney disease. He recently purchased Jeremy new pair of boots and unfortunately, they rubbed blister on his left great toe. Due to neuropathy, patient thought initially for this. Since then, the blister broke down and he now has an ulcer on the dorsal medial aspect of his great toe. His family has been managing it with hydrocolloid dressings and topical triple antibiotic ointment. It had not been improving, so he was referred to the wound care center for further  evaluation and management. His family does not check his blood glucose regularly but says that it is generally around 140 when they do so. ABI in clinic today was 0.76. The last hemoglobin A1c I have available for my review was from February 2022, at which time it was 7.6. There is Jeremy circular wound on the dorsal medial aspect of the patient's left great toe. It has Jeremy thick layer of fibrinous slough on the surface. It does not probe to bone. There is no purulent drainage, erythema, or malodor. 04/26/2022: The wound is Jeremy little bit smaller today. There is light slough accumulation over an otherwise clean base. Mild periwound eschar buildup. No concern for infection. 05/04/2022: The wound continues to contract. The orifice has narrowed Jeremy bit with buildup of callus and eschar. The wound base is pink. 05/18/2022: His wound is healed. Electronic Signature(s) Signed: 05/18/2022 2:12:43 PM By: Duanne Guess MD FACS Entered By: Duanne Guess on 05/18/2022 14:12:43 [name], [name] ([id number]) [id number] Page 2 of 7 -------------------------------------------------------------------------------- Physical Exam Details Patient Name: Date of Service: [name], Jeremy LBERT Wallace. 05/18/2022 1:15 PM Medical Record Number: [id number] Patient Account Number: [id number] Date of Birth/Sex: Treating RN: April 20, 1929 (88 y.o. M) Primary Care Provider: Maudie Flakes Other Clinician: Referring Provider: Treating Provider/Extender: Mellody Memos in Treatment: 4 Constitutional Within normal range for this patient. . . . No acute distress.Marland Kitchen Respiratory Normal work of breathing on supplemental oxygen.. Notes 05/18/2022: His wound is healed. Electronic Signature(s) Signed: 05/18/2022 2:16:03 PM By: Duanne Guess MD FACS Previous Signature: 05/18/2022 2:13:29 PM Version By: Duanne Guess MD FACS Entered By: Duanne Guess on 05/18/2022  14:16:03 -------------------------------------------------------------------------------- Physician Orders Details Patient Name: Date of Service: [name], Jeremy LBERT Wallace. 05/18/2022 1:15 PM Medical Record Number: [id number] Patient Account Number: [id number] Date of Birth/Sex: Treating RN: Sep 29, 1928 (88 y.o. Damaris Schooner Primary Care Provider: Maudie Flakes Other Clinician: Referring  Provider: Treating Provider/Extender: Mellody Memos in Treatment: 4 Verbal / Phone Orders: No Diagnosis Coding ICD-10 Coding Code Description [phone] Non-pressure chronic ulcer of other part of left foot with fat layer exposed I50.22 Chronic systolic (congestive) heart failure I25.10 Atherosclerotic heart disease of native coronary artery without angina pectoris E11.621 Type 2 diabetes mellitus with foot ulcer N18.9 Chronic kidney disease, unspecified Discharge From [facility] Services Discharge from Wound Care Center - Congratulations! Edema Control - Lymphedema / SCD / Other Elevate legs to the level of the heart or above for 30 minutes daily and/or when sitting, Jeremy frequency of: Avoid standing for long periods of time. Moisturize legs daily. Non Wound Condition Protect area with: - healed left toe with Jeremy callous or corn cusion Electronic Signature(s) Signed: 05/18/2022 2:14:36 PM By: Duanne Guess MD FACS Entered By: Duanne Guess on 05/18/2022 14:14:35 [name] ([id number]) [id number] Page 3 of 7 -------------------------------------------------------------------------------- Problem List Details Patient Name: Date of Service: [name], [name] 05/18/2022 1:15 PM Medical Record Number: [id number] Patient Account Number: [id number] Date of Birth/Sex: Treating RN: 02-14-1929 (88 y.o. Damaris Schooner Primary Care Provider: Maudie Flakes Other Clinician: Referring Provider: Treating Provider/Extender: Mellody Memos in Treatment: 4 Active Problems ICD-10 Encounter Code Description Active Date MDM Diagnosis [phone] Non-pressure chronic ulcer of other part of left foot with fat layer exposed 04/18/2022 No Yes I50.22 Chronic systolic (congestive) heart failure 04/18/2022 No Yes I25.10 Atherosclerotic heart disease of native coronary artery without angina pectoris 04/18/2022 No Yes E11.621 Type 2 diabetes mellitus with foot ulcer 04/18/2022 No Yes N18.9 Chronic kidney disease, unspecified 04/18/2022 No Yes Inactive Problems Resolved Problems Electronic Signature(s) Signed: 05/18/2022 2:11:42 PM By: Duanne Guess MD FACS Previous Signature: 05/18/2022 1:56:30 PM Version By: Duanne Guess MD FACS Entered By: Duanne Guess on 05/18/2022 14:11:42 -------------------------------------------------------------------------------- Progress Note Details Patient Name: Date of Service: [name], Jeremy LBERT Wallace. 05/18/2022 1:15 PM Medical Record Number: [id number] Patient Account Number: [id number] Date of Birth/Sex: Treating RN: August 11, 1928 (88 y.o. M) Primary Care Provider: Maudie Flakes Other Clinician: Referring Provider: Treating Provider/Extender: Mellody Memos in Treatment: 4 Subjective Chief Complaint Information obtained from Patient Patients presents for treatment of an open diabetic ulcer [name], [name] ([id number]) [id number] Page 4 of 7 History of Present Illness (HPI) ADMISSION 04/18/2022 This is Jeremy 88 year old man with Jeremy past medical history significant for CHF New [place] Heart Association class III, chronic respiratory failure with hypoxia, type 2 diabetes, AICD, and chronic kidney disease. He recently purchased Jeremy new pair of boots and unfortunately, they rubbed blister on his left great toe. Due to neuropathy, patient thought initially for this. Since then, the blister broke down and he now has an  ulcer on the dorsal medial aspect of his great toe. His family has been managing it with hydrocolloid dressings and topical triple antibiotic ointment. It had not been improving, so he was referred to the wound care center for further evaluation and management. His family does not check his blood glucose regularly but says that it is generally around 140 when they do so. ABI in clinic today was 0.76. The last hemoglobin A1c I have available for my review was from February 2022, at which time it was 7.6. There is Jeremy circular wound on the dorsal medial aspect of the patient's left great toe. It has Jeremy thick layer of fibrinous slough on the surface. It does not probe to bone. There is no purulent  drainage, erythema, or malodor. 04/26/2022: The wound is Jeremy little bit smaller today. There is light slough accumulation over an otherwise clean base. Mild periwound eschar buildup. No concern for infection. 05/04/2022: The wound continues to contract. The orifice has narrowed Jeremy bit with buildup of callus and eschar. The wound base is pink. 05/18/2022: His wound is healed. Patient History Family History Unknown History, Diabetes - Paternal Grandparents,Maternal Grandparents, No family history of Cancer. Social History Never smoker, Marital Status - Married, Alcohol Use - Rarely, Drug Use - No History, Caffeine Use - Daily. Medical History Eyes Denies history of Cataracts, Glaucoma, Optic Neuritis Ear/Nose/Mouth/Throat Denies history of Chronic sinus problems/congestion, Middle ear problems Hematologic/Lymphatic Denies history of Anemia, Hemophilia, Human Immunodeficiency Virus, Lymphedema, Sickle Cell Disease Respiratory Denies history of Aspiration, Asthma, Chronic Obstructive Pulmonary Disease (COPD), Pneumothorax, Sleep Apnea, Tuberculosis Cardiovascular Patient has history of Congestive Heart Failure, Coronary Artery Disease, Myocardial Infarction Endocrine Patient has history of Type II  Diabetes Integumentary (Skin) Denies history of History of Burn Musculoskeletal Denies history of Gout, Rheumatoid Arthritis, Osteoarthritis, Osteomyelitis Neurologic Patient has history of Neuropathy Oncologic Denies history of Received Chemotherapy, Received Radiation Psychiatric Denies history of Anorexia/bulimia, Confinement Anxiety Hospitalization/Surgery History - AICD. Medical Jeremy Surgical History Notes nd Cardiovascular Atrial Fibrillation Genitourinary BPH Objective Constitutional Within normal range for this patient. No acute distress.. Vitals Time Taken: 1:46 PM, Height: 73 in, Weight: 169.7 lbs, BMI: 22.4, Temperature: 97.5 F, Pulse: 60 bpm, Respiratory Rate: 18 breaths/min, Blood Pressure: 99/58 mmHg, Capillary Blood Glucose: 138 mg/dl. General Notes: glucose per pt report last week Respiratory Normal work of breathing on supplemental oxygen.. General Notes: 05/18/2022: His wound is healed. Integumentary (Hair, Skin) Wound #1 status is Healed - Epithelialized. Original cause of wound was Blister. The date acquired was: 03/22/2022. The wound has been in treatment 4 weeks. The wound is located on the Left,Medial T [place]. The wound measures 0cm length x 0cm width x 0cm depth; 0cm^2 area and 0cm^3 volume. There is Fat oe Layer (Subcutaneous Tissue) exposed. There is no tunneling or undermining noted. There is Jeremy small amount of serosanguineous drainage noted. The wound Jeremy KeensBRADFORD, [name] ([id number]) [id number] Page 5 of 7 margin is distinct with the outline attached to the wound base. There is no granulation within the wound bed. There is no necrotic tissue within the wound bed. The periwound skin appearance had no abnormalities noted for color. The periwound skin appearance exhibited: Callus. The periwound skin appearance did not exhibit: Dry/Scaly, Maceration. Periwound temperature was noted as No Abnormality. General Notes:  scabbed Assessment Active Problems ICD-10 Non-pressure chronic ulcer of other part of left foot with fat layer exposed Chronic systolic (congestive) heart failure Atherosclerotic heart disease of native coronary artery without angina pectoris Type 2 diabetes mellitus with foot ulcer Chronic kidney disease, unspecified [facility] Discharge from W[facility] Care Center - Congratulations! Edema Control - Lymphedema / SCD / Other: Elevate legs to the level of the heart or above for 30 minutes daily and/or when sitting, Jeremy frequency of: Avoid standing for long periods of time. Moisturize legs daily. Non Wound Condition: Protect area with: - healed left toe with Jeremy callous or corn cusion 05/18/2022: His wound is healed. I did recommend that due to the bony prominence on his toe where the wound occurred, that he keep the area padded and protected with something like Jeremy callus or corn pad or Jeremy foam doughnut. We will discharge him from the wound care center. He may follow-up  on an as-needed basis. Electronic Signature(s) Signed: 05/18/2022 2:16:16 PM By: Duanne Guess MD FACS Previous Signature: 05/18/2022 2:15:22 PM Version By: Duanne Guess MD FACS Entered By: Duanne Guess on 05/18/2022 14:16:16 -------------------------------------------------------------------------------- HxROS Details Patient Name: Date of Service: [name], Jeremy LBERT Wallace. 05/18/2022 1:15 PM Medical Record Number: [id number] Patient Account Number: [id number] Date of Birth/Sex: Treating RN: Jul 13, 1928 (88 y.o. M) Primary Care Provider: Maudie Flakes Other Clinician: Referring Provider: Treating Provider/Extender: Mellody Memos in Treatment: 4 Eyes Medical History: Negative for: Cataracts; Glaucoma; Optic Neuritis Ear/Nose/Mouth/Throat Medical History: Negative for: Chronic sinus problems/congestion; Middle ear problems Hematologic/Lymphatic Medical  History: Negative for: Anemia; Hemophilia; Human Immunodeficiency Virus; Lymphedema; Sickle Cell Disease [name], [name] ([id number]) [id number] Page 6 of 7 Respiratory Medical History: Negative for: Aspiration; Asthma; Chronic Obstructive Pulmonary Disease (COPD); Pneumothorax; Sleep Apnea; Tuberculosis Cardiovascular Medical History: Positive for: Congestive Heart Failure; Coronary Artery Disease; Myocardial Infarction Past Medical History Notes: Atrial Fibrillation Endocrine Medical History: Positive for: Type II Diabetes Time with diabetes: 10 years Blood sugar tested every day: No Genitourinary Medical History: Past Medical History Notes: BPH Integumentary (Skin) Medical History: Negative for: History of Burn Musculoskeletal Medical History: Negative for: Gout; Rheumatoid Arthritis; Osteoarthritis; Osteomyelitis Neurologic Medical History: Positive for: Neuropathy Oncologic Medical History: Negative for: Received Chemotherapy; Received Radiation Psychiatric Medical History: Negative for: Anorexia/bulimia; Confinement Anxiety Immunizations Pneumococcal Vaccine: Received Pneumococcal Vaccination: Yes Received Pneumococcal Vaccination On or After 60th Birthday: Yes Implantable Devices Yes Hospitalization / Surgery History Type of Hospitalization/Surgery AICD Family and Social History Unknown History: Yes; Cancer: No; Diabetes: Yes - Paternal Grandparents,Maternal Grandparents; Never smoker; Marital Status - Married; Alcohol Use: Rarely; Drug Use: No History; Caffeine Use: Daily; Financial Concerns: No; Food, Clothing or Shelter Needs: No; Support System Lacking: No; Transportation Concerns: No Electronic Signature(s) Signed: 05/18/2022 3:05:34 PM By: Duanne Guess MD FACS Entered By: Duanne Guess on 05/18/2022 14:13:02 [name], [name] ([id number]) [id number] Page 7 of  7 -------------------------------------------------------------------------------- SuperBill Details Patient Name: Date of Service: [name], Jeremy Elita Boone 05/18/2022 Medical Record Number: [id number] Patient Account Number: [id number] Date of Birth/Sex: Treating RN: [birth year] (88 y.o. Damaris Schooner Primary Care Provider: Maudie Flakes Other Clinician: Referring Provider: Treating Provider/Extender: Mellody Memos in Treatment: 4 Diagnosis Coding ICD-10 Codes Code Description [phone] Non-pressure chronic ulcer of other part of left foot with fat layer exposed I50.22 Chronic systolic (congestive) heart failure I25.10 Atherosclerotic heart disease of native coronary artery without angina pectoris E11.621 Type 2 diabetes mellitus with foot ulcer N18.9 Chronic kidney disease, unspecified Facility Procedures : CPT4 Code: [id number] Description: 99213 - WOUND CARE VISIT-LEV 3 EST PT Modifier: Quantity: 1 Physician Procedures : CPT4 Code Description Modifier [id number] - WC PHYS LEVEL 3 - EST PT ICD-10 Diagnosis Description L97.522 Non-pressure chronic ulcer of other part of left foot with fat layer exposed I50.22 Chronic systolic (congestive) heart failure E11.621 Type 2  diabetes mellitus with foot ulcer I25.10 Atherosclerotic heart disease of native coronary artery without angina pectoris Quantity: 1 Electronic Signature(s) Signed: 05/18/2022 2:16:24 PM By: Duanne Guess MD FACS Previous Signature: 05/18/2022 2:15:45 PM Version By: Duanne Guess MD FACS Entered By: Duanne Guess on 05/18/2022 14:16:24

## 2022-05-19 NOTE — Progress Notes (Signed)
OLUWASEMILORE, BAHL ([id number]) [id number] Page 1 of 8 Visit Report for 05/18/2022 Arrival Information Details Patient Name: Date of Service: [name], [name] 05/18/2022 1:15 PM Medical Record Number: [id number] Patient Account Number: [id number] Date of Birth/Sex: Treating RN: [birth year] (88 y.o. [name] Primary Care Shanteria Laye: Maudie Flakes Other Clinician: Referring Shanessa Hodak: Treating Yerania Chamorro/Extender: Mellody Memos in Treatment: 4 Visit Information History Since Last Visit Added or deleted any medications: No Patient Arrived: [name] Any new allergies or adverse reactions: No Arrival Time: 13:41 Had Jeremy fall or experienced change in No Accompanied By: son activities of daily living that may affect Transfer Assistance: None risk of falls: Patient Identification Verified: Yes Signs or symptoms of abuse/neglect since last visito No Secondary Verification Process Completed: Yes Hospitalized since last visit: No Patient Requires Transmission-Based Precautions: No Implantable device outside of the clinic excluding No Patient Has Alerts: No cellular tissue based products placed in the center since last visit: Has Dressing in Place as Prescribed: Yes Pain Present Now: Yes Electronic Signature(s) Signed: 05/18/2022 5:26:30 PM By: Zenaida Deed RN, BSN Entered By: Zenaida Deed on 05/18/2022 13:46:48 -------------------------------------------------------------------------------- Clinic Level of Care Assessment Details Patient Name: Date of Service: [name], Jeremy LBERT Wallace. 05/18/2022 1:15 PM Medical Record Number: [id number] Patient Account Number: [id number] Date of Birth/Sex: Treating RN: Nov 25, 1928 (88 y.o. [name] Primary Care Olyn Landstrom: Maudie Flakes Other Clinician: Referring Tiphanie Vo: Treating Xabi Wittler/Extender: Mellody Memos in Treatment: 4 Clinic Level of Care  Assessment Items TOOL 4 Quantity Score []  - 0 Use when only an EandM is performed on FOLLOW-UP visit ASSESSMENTS - Nursing Assessment / Reassessment X- 1 10 Reassessment of Co-morbidities (includes updates in patient status) X- 1 5 Reassessment of Adherence to Treatment Plan ASSESSMENTS - Wound and Skin Jeremy ssessment / Reassessment X - Simple Wound Assessment / Reassessment - one wound 1 5 []  - 0 Complex Wound Assessment / Reassessment - multiple wounds []  - 0 Dermatologic / Skin Assessment (not related to wound area) ASSESSMENTS - Focused Assessment []  - 0 Circumferential Edema Measurements - multi extremities []  - 0 Nutritional Assessment / Counseling / Intervention [name], [name] ( ) [id number] Page 2 of 8 X- 1 5 Lower Extremity Assessment (monofilament, tuning fork, pulses) []  - 0 Peripheral Arterial Disease Assessment (using hand held doppler) ASSESSMENTS - Ostomy and/or Continence Assessment and Care []  - 0 Incontinence Assessment and Management []  - 0 Ostomy Care Assessment and Management (repouching, etc.) PROCESS - Coordination of Care X - Simple Patient / Family Education for ongoing care 1 15 []  - 0 Complex (extensive) Patient / Family Education for ongoing care X- 1 10 Staff obtains , Records, T Results / Process Orders est []  - 0 Staff telephones HHA, Nursing Homes / Clarify orders / etc []  - 0 Routine Transfer to another Facility (non-emergent condition) []  - 0 Routine Hospital Admission (non-emergent condition) []  - 0 New Admissions / / Ordering NPWT Apligraf, etc. , []  - 0 Emergency Hospital Admission (emergent condition) X- 1 10 Simple Discharge Coordination []  - 0 Complex (extensive) Discharge Coordination PROCESS - Special Needs []  - 0 Pediatric / Minor Patient Management []  - 0 Isolation Patient Management []  - 0 Hearing / Language / Visual special needs []  -  0 Assessment of Community assistance (transportation, D/C planning, etc.) []  - 0 Additional assistance / Altered mentation []  - 0 Support Surface(s) Assessment (bed, cushion, seat, etc.) INTERVENTIONS - Wound Cleansing / Measurement X -  Simple Wound Cleansing - one wound 1 5 []  - 0 Complex Wound Cleansing - multiple wounds X- 1 5 Wound Imaging (photographs - any number of wounds) []  - 0 Wound Tracing (instead of photographs) []  - 0 Simple Wound Measurement - one wound []  - 0 Complex Wound Measurement - multiple wounds INTERVENTIONS - Wound Dressings X - Small Wound Dressing one or multiple wounds 1 10 []  - 0 Medium Wound Dressing one or multiple wounds []  - 0 Large Wound Dressing one or multiple wounds []  - 0 Application of Medications - topical []  - 0 Application of Medications - injection INTERVENTIONS - Miscellaneous []  - 0 External ear exam []  - 0 Specimen Collection (cultures, biopsies, blood, body fluids, etc.) []  - 0 Specimen(s) / Culture(s) sent or taken to Lab for analysis []  - 0 Patient Transfer (multiple staff / / Similar devices) []  - 0 Simple Staple / Suture removal (25 or less) []  - 0 Complex Staple / Suture removal (26 or more) []  - 0 Hypo / Hyperglycemic Management (close monitor of Blood Glucose) [name], [name] ( ) [id number] Page 3 of 8 []  - 0 Ankle / Brachial Index (ABI) - do not check if billed separately X- 1 5 Vital Signs Has the patient been seen at the hospital within the last three years: Yes Total Score: 85 Level Of Care: New/Established - Level 3 Electronic Signature(s) Signed: 05/18/2022 5:26:30 PM By: RN, BSN Entered By: on 05/18/2022 14:09:42 -------------------------------------------------------------------------------- Encounter Discharge Information Details Patient Name: Date of Service: [name], Jeremy LBERT Wallace. 05/18/2022 1:15 PM Medical Record  Number: Patient Account Number: Date of Birth/Sex: Treating RN: Nov 06, 1928 (88 y.o. Primary Care Joseph Johns: Other Clinician: Referring Gustie Bobb: Treating Peter Keyworth/Extender: in Treatment: 4 Encounter Discharge Information Items Discharge Condition: Stable Ambulatory Status: Walker Discharge Destination: Home Transportation: Private Auto Accompanied By: son Schedule Follow-up Appointment: Yes Clinical Summary of Care: Patient Declined Electronic Signature(s) Signed: 05/18/2022 5:26:30 PM By: [id number] RN, BSN Entered By: 04-[birth year]-1976 on 05/18/2022 14:11:00 -------------------------------------------------------------------------------- Lower Extremity Assessment Details Patient Name: Date of Service: [name], Jeremy LBERT Wallace. 05/18/2022 1:15 PM Medical Record Number: Zenaida Deed Patient Account Number: Zenaida Deed Date of Birth/Sex: Treating RN: 06/13/1929 (88 y.o. [id number] Primary Care Wyman Meschke: [id number] Other Clinician: Referring Adiana Smelcer: Treating Paizlee Kinder/Extender: 10/04/1928 in Treatment: 4 Edema Assessment Assessed: 82: No] [name]: No] Edema: [Left: N] [Right: o] Calf Left: Right: Point of Measurement: From Medial Instep 33 cm Ankle Left: Right: Point of Measurement: From Medial Instep 20 cm Vascular Assessment [name], [name] (Mellody Memos) [Right:[id number] Page 4 of 8] Pulses: Dorsalis Pedis Palpable: [Left:Yes] Electronic Signature(s) Signed: 05/18/2022 5:26:30 PM By: Zenaida Deed RN, BSN Entered By: Zenaida Deed on 05/18/2022 13:48:35 -------------------------------------------------------------------------------- Multi Wound Chart Details Patient Name: Date of Service: [name], Jeremy LBERT Wallace. 05/18/2022 1:15 PM Medical Record Number: [id number] Patient Account Number:  [id number] Date of Birth/Sex: Treating RN: 05-19-1929 (88 y.o. M) Primary Care Zarie Kosiba: [name] Other Clinician: Referring Davanna He: Treating Shanesha Bednarz/Extender: Maudie Flakes in Treatment: 4 Vital Signs Height(in): 73 Capillary Blood Glucose(mg/dl): Mellody Memos Weight(lbs): Kyra Searles Pulse(bpm): 60 Body Mass Index(BMI): 22.4 Blood Pressure(mmHg): 99/58 Temperature(F): 97.5 Respiratory Rate(breaths/min): 18 [1:Photos:] [N/Jeremy:N/Jeremy] Left, Medial T Great oe N/Jeremy N/Jeremy Wound Location: Blister N/Jeremy N/Jeremy Wounding Event: Diabetic Wound/Ulcer of the Lower N/Jeremy N/Jeremy Primary Etiology: Extremity Congestive Heart Failure, Coronary N/Jeremy N/Jeremy Comorbid History:  Artery Disease, Myocardial Infarction, Type II Diabetes, Neuropathy 03/22/2022 N/Jeremy N/Jeremy Date Acquired: 4 N/Jeremy N/Jeremy Weeks of Treatment: Healed - Epithelialized N/Jeremy N/Jeremy Wound Status: No N/Jeremy N/Jeremy Wound Recurrence: 0x0x0 N/Jeremy N/Jeremy Measurements L x W x D (cm) 0 N/Jeremy N/Jeremy Jeremy (cm) : rea 0 N/Jeremy N/Jeremy Volume (cm) : 100.00% N/Jeremy N/Jeremy % Reduction in Jeremy rea: 100.00% N/Jeremy N/Jeremy % Reduction in Volume: Grade 2 N/Jeremy N/Jeremy Classification: Small N/Jeremy N/Jeremy Exudate Jeremy mount: Serosanguineous N/Jeremy N/Jeremy Exudate Type: red, brown N/Jeremy N/Jeremy Exudate Color: Distinct, outline attached N/Jeremy N/Jeremy Wound Margin: None Present (0%) N/Jeremy N/Jeremy Granulation Jeremy mount: None Present (0%) N/Jeremy N/Jeremy Necrotic Jeremy mount: Fat Layer (Subcutaneous Tissue): Yes N/Jeremy N/Jeremy Exposed Structures: Fascia: No Tendon: No Muscle: No Joint: No Bone: No Large (67-100%) N/Jeremy N/Jeremy Epithelialization: Callus: Yes N/Jeremy N/Jeremy Periwound Skin Texture: Maceration: No N/Jeremy N/Jeremy Periwound Skin Moisture: Dry/Scaly: No No Abnormalities Noted N/Jeremy N/Jeremy Periwound Skin Color: No Abnormality N/Jeremy N/Jeremy TemperatureNorberta Wallace: Nusser, Prudencio Wallace ([id number]) [id number] Page 5 of 8 scabbed N/Jeremy N/Jeremy Assessment Notes: Treatment Notes Wound #1 (Toe Great) Wound Laterality: Left,  Medial Cleanser Peri-Wound Care Topical Primary Dressing Secondary Dressing Secured With Compression Wrap Compression Stockings Add-Ons Electronic Signature(s) Signed: 05/18/2022 2:11:46 PM By: Duanne Guessannon, Jennifer MD FACS Entered By: Duanne Guessannon, Jennifer on 05/18/2022 14:11:46 -------------------------------------------------------------------------------- Multi-Disciplinary Care Plan Details Patient Name: Date of Service: [name], Jeremy LBERT Wallace. 05/18/2022 1:15 PM Medical Record Number: [id number] Patient Account Number: [id number] Date of Birth/Sex: Treating RN: 07/05/1928 (88 y.o. [name]) Boehlein, Linda Primary Care Delesia Martinek: Maudie FlakesFeldpausch, Dale Other Clinician: Referring Nikesh Teschner: Treating Shannia Jacuinde/Extender: Mellody Memosannon, Jennifer Feldpausch, Dale Weeks in Treatment: 4 Multidisciplinary Care Plan reviewed with physician Active Inactive Electronic Signature(s) Signed: 05/18/2022 5:26:30 PM By: Zenaida DeedBoehlein, Linda RN, BSN Entered By: Zenaida DeedBoehlein, Linda on 05/18/2022 14:02:16 -------------------------------------------------------------------------------- Pain Assessment Details Patient Name: Date of Service: [name], Jeremy LBERT Wallace. 05/18/2022 1:15 PM Medical Record Number: [id number] Patient Account Number: [id number] Date of Birth/Sex: Treating RN: 02/20/1929 (88 y.o. [name]) Boehlein, Linda Primary Care Kyndra Condron: Maudie FlakesFeldpausch, Dale Other Clinician: Referring Treanna Dumler: Treating Anuel Sitter/Extender: Mellody Memosannon, Jennifer Feldpausch, Dale Weeks in Treatment: 4 Active Problems Location of Pain Severity and Description of Pain Patient Has Paino No Jeremy KeensBRADFORD, [name] ([id number]) [id number] Page 6 of 8 Patient Has Paino No Site Locations Rate the pain. Current Pain Level: 0 Pain Management and Medication Current Pain Management: Electronic Signature(s) Signed: 05/18/2022 5:26:30 PM By: Zenaida DeedBoehlein, Linda RN, BSN Entered By: Zenaida DeedBoehlein, Linda on 05/18/2022  13:47:48 -------------------------------------------------------------------------------- Patient/Caregiver Education Details Patient Name: Date of Service: [name], Jeremy LemmingA LBERT Wallace. 11/16/2023andnbsp1:15 PM Medical Record Number: [id number] Patient Account Number: [id number] Date of Birth/Gender: Treating RN: 09/22/1928 (88 y.o. [name]) Boehlein, Linda Primary Care Physician: Maudie FlakesFeldpausch, Dale Other Clinician: Referring Physician: Treating Physician/Extender: Mellody Memosannon, Jennifer Feldpausch, Dale Weeks in Treatment: 4 Education Assessment Education Provided To: Patient Education Topics Provided Offloading: Methods: Explain/Verbal Responses: Reinforcements needed, State content correctly Wound/Skin Impairment: Methods: Explain/Verbal Responses: Reinforcements needed, State content correctly Electronic Signature(s) Signed: 05/18/2022 5:26:30 PM By: Zenaida DeedBoehlein, Linda RN, BSN Entered By: Zenaida DeedBoehlein, Linda on 05/18/2022 13:56:12 Jeremy KeensBRADFORD, [name] ([id number]) [id number] Page 7 of 8 -------------------------------------------------------------------------------- Wound Assessment Details Patient Name: Date of Service: [name], [name] LBERT Wallace. 05/18/2022 1:15 PM Medical Record Number: [id number] Patient Account Number: [id number] Date of Birth/Sex: Treating RN: 02/22/1929 (88 y.o. [name]) Boehlein, Linda Primary Care Shelbylynn Walczyk: Maudie FlakesFeldpausch, Dale Other Clinician: Referring Afnan Emberton: Treating Lollie Gunner/Extender: Mellody Memosannon, Jennifer Feldpausch, Dale Weeks in Treatment: 4 Wound Status Wound Number: 1 Primary Diabetic Wound/Ulcer  of the Lower Extremity Etiology: Wound Location: Left, Medial T Great oe Wound Healed - Epithelialized Wounding Event: Blister Status: Date Acquired: 03/22/2022 Comorbid Congestive Heart Failure, Coronary Artery Disease, Myocardial Weeks Of Treatment: 4 History: Infarction, Type II Diabetes, Neuropathy Clustered Wound: No Photos Wound Measurements Length:  (cm) Width: (cm) Depth: (cm) Area: (cm) Volume: (cm) 0 % Reduction in Area: 100% 0 % Reduction in Volume: 100% 0 Epithelialization: Large (67-100%) 0 Tunneling: No 0 Undermining: No Wound Description Classification: Grade 2 Wound Margin: Distinct, outline attached Exudate Amount: Small Exudate Type: Serosanguineous Exudate Color: red, brown Foul Odor After Cleansing: No Slough/Fibrino Yes Wound Bed Granulation Amount: None Present (0%) Exposed Structure Necrotic Amount: None Present (0%) Fascia Exposed: No Fat Layer (Subcutaneous Tissue) Exposed: Yes Tendon Exposed: No Muscle Exposed: No Joint Exposed: No Bone Exposed: No Periwound Skin Texture Texture Color No Abnormalities Noted: No No Abnormalities Noted: Yes Callus: Yes Temperature / Pain Temperature: No Abnormality Moisture No Abnormalities Noted: No Dry / Scaly: No Maceration: No Assessment Notes scabbed Treatment Notes Wound #1 (Toe Great) Wound Laterality: Left, Medial Cleanser Peri-Wound Care [name], [name] ([id number]) [id number] Page 8 of 8 Topical Primary Dressing Secondary Dressing Secured With Compression Wrap Compression Stockings Add-Ons Electronic Signature(s) Signed: 05/18/2022 5:26:30 PM By: Zenaida Deed RN, BSN Entered By: Zenaida Deed on 05/18/2022 14:01:39 -------------------------------------------------------------------------------- Vitals Details Patient Name: Date of Service: [name], Jeremy LBERT Wallace. 05/18/2022 1:15 PM Medical Record Number: [id number] Patient Account Number: [id number] Date of Birth/Sex: Treating RN: June 26, 1929 (88 y.o. [name] Primary Care Hasana Alcorta: Maudie Flakes Other Clinician: Referring Yajaira Doffing: Treating Donyae Kohn/Extender: Mellody Memos in Treatment: 4 Vital Signs Time Taken: 13:46 Temperature (F): 97.5 Height (in): 73 Pulse (bpm): 60 Weight (lbs): 169.7 Respiratory Rate  (breaths/min): 18 Body Mass Index (BMI): 22.4 Blood Pressure (mmHg): 99/58 Capillary Blood Glucose (mg/dl): 144 Reference Range: 80 - 120 mg / dl Notes glucose per pt report last week Electronic Signature(s) Signed: 05/18/2022 5:26:30 PM By: Zenaida Deed RN, BSN Entered By: Zenaida Deed on 05/18/2022 13:47:41

## 2022-08-04 ENCOUNTER — Ambulatory Visit
Admission: EM | Admit: 2022-08-04 | Discharge: 2022-08-04 | Disposition: A | Discharge status: Home / Self Care | Payer: Medicare Other | Attending: Nurse Practitioner | Admitting: Nurse Practitioner

## 2022-08-04 DIAGNOSIS — Z9359 Other cystostomy status: Secondary | ICD-10-CM | POA: Diagnosis not present

## 2022-08-04 DIAGNOSIS — T83510A Infection and inflammatory reaction due to cystostomy catheter, initial encounter: Secondary | ICD-10-CM

## 2022-08-04 MED ORDER — CEFDINIR 300 MG PO CAPS: 300.0000 mg | ORAL_CAPSULE | Freq: Two times a day (BID) | ORAL | 0 refills | Status: AC

## 2022-08-04 NOTE — ED Provider Notes (Signed)
[facility]    CSN: [id number] Arrival date & time: 08/04/22  1435      History   Chief Complaint Chief Complaint  Patient presents with   Groin Swelling    HPI [name] is a 89 y.o. male for evaluation of supra pubic catheter infection.  Patient is accompanied by his son.  Patient has an indwelling suprapubic catheter that is changed by his ER nurse daughter-in-law every 3 weeks.  It was changed 2 days ago.  Daughter-in-law noticed some redness at that time for the past couple days there has been some purulent discharge.  Attempted to contact the urologist but was advised they cannot be seen till next week.  Patient states that the first time his catheter has gotten infected.  He denies any fevers, chills.  No MRSA history.  Patient states he feels "fine".  No other concerns at this time.  HPI  Past Medical History:  Diagnosis Date   CAD (coronary artery disease)    Cardiac defibrillator in place 01/2020   CHF (congestive heart failure) (HCC)    Diabetes mellitus without complication ([place])    History of myocardial infarction 01/2020   Peripheral neuropathy    Restless leg syndrome     Patient Active Problem List   Diagnosis Date Noted   Community acquired pneumonia 10/24/2020   Acute respiratory failure with hypoxia ([place]) 10/24/2020   Acute-on-chronic kidney injury ([place]) 10/24/2020   Influenza A 10/24/2020   Elevated troponin 08/23/2020   Diabetes mellitus without complication ([place]) 25/36/6440   BPH (benign prostatic hyperplasia) 08/23/2020   Postural dizziness with presyncope 08/23/2020   AICD (automatic cardioverter/defibrillator) present 08/23/2020   Carotid stenosis 08/23/2020   Sacral decubitus ulcer 08/23/2020   Near syncope 34/74/2595   Chronic systolic CHF (congestive heart failure), NYHA class 3 ([place]) 07/20/2020   Coronary artery disease involving native coronary artery of native heart 07/20/2020   Hyperlipidemia, mixed 07/20/2020    Atrial fibrillation ([place]) 07/06/2020    Past Surgical History:  Procedure Laterality Date   APPENDECTOMY     CARDIAC DEFIBRILLATOR PLACEMENT Left 01/2020   PARTIAL KNEE ARTHROPLASTY Left        Home Medications    Prior to Admission medications   Medication Sig Start Date End Date Taking? Authorizing Provider  acetaminophen (TYLENOL) 500 MG tablet Take 1 tablet (500 mg total) by mouth every 6 (six) hours as needed. 08/24/20  Yes Jennye Boroughs, MD  albuterol (VENTOLIN HFA) 108 (90 Base) MCG/ACT inhaler Inhale 2 puffs into the lungs every 6 (six) hours as needed for wheezing or shortness of breath. Use 2 times daily x5 days, then every 6 hours as needed. 10/27/20  Yes Eugenie Filler, MD  amiodarone (PACERONE) 200 MG tablet Take 200 mg by mouth daily. 04/10/22  Yes [provider]  aspirin 81 MG EC tablet Take 81 mg by mouth daily.   Yes [provider]  brimonidine (ALPHAGAN P) 0.1 % SOLN Place 1 drop into the left eye 2 (two) times daily.   Yes [provider]  cefdinir (OMNICEF) 300 MG capsule Take 1 capsule (300 mg total) by mouth 2 (two) times daily for 7 days. 08/04/22 08/11/22 Yes Melynda Ripple, NP  dorzolamide (TRUSOPT) 2 % ophthalmic solution Place 1 drop into both eyes 2 (two) times daily.   Yes [provider]  ENTRESTO 24-26 MG SMARTSIG:1 Tablet(s) By Mouth Every 12 Hours 11/01/20  Yes Eugenie Filler, MD  furosemide (LASIX) 40  MG tablet Take 1 tablet (40 mg total) by mouth daily. 11/01/20  Yes Eugenie Filler, MD  gabapentin (NEURONTIN) 300 MG capsule Take 300 mg by mouth 2 (two) times daily. 07/06/20  Yes [provider]  guaiFENesin (MUCINEX) 600 MG 12 hr tablet Take 600 mg by mouth 2 (two) times daily.   Yes [provider]  latanoprost (XALATAN) 0.005 % ophthalmic solution Place 1 drop into both eyes at bedtime.   Yes [provider]  metFORMIN (GLUCOPHAGE) 500 MG tablet Take 500 mg by mouth daily with breakfast.    Yes [provider]  metoprolol succinate (TOPROL-XL) 25 MG 24 hr tablet Take 12.5 mg by mouth daily. 08/11/20  Yes [provider]  midodrine (PROAMATINE) 5 MG tablet Take 1 tablet (5 mg total) by mouth 3 (three) times daily with meals. 10/27/20  Yes Eugenie Filler, MD  Multiple Vitamin (MULTI-VITAMIN) tablet Take 1 tablet by mouth daily.   Yes [provider]  tamsulosin (FLOMAX) 0.4 MG CAPS capsule Take 0.4 mg by mouth daily.   Yes [provider]    Family History History reviewed. No pertinent family history.  Social History Social History   Tobacco Use   Smoking status: Never   Smokeless tobacco: Never  Vaping Use   Vaping Use: Never used  Substance Use Topics   Alcohol use: Not Currently   Drug use: Not Currently     Allergies   Patient has no known allergies.   Review of Systems Review of Systems  Skin:        Infection of suprapubic catheter     Physical Exam Triage Vital Signs ED Triage Vitals  Enc Vitals Group     BP 08/04/22 1515 (!) 97/55     Pulse Rate 08/04/22 1515 90     Resp 08/04/22 1515 18     Temp 08/04/22 1515 (!) 97.5 F (36.4 C)     Temp Source 08/04/22 1515 Oral     SpO2 08/04/22 1515 91 %     Weight 08/04/22 1514 162 lb (73.5 kg)     Height 08/04/22 1514 5\' 10"  (1.778 m)     Head Circumference --      Peak Flow --      Pain Score 08/04/22 1514 0     Pain Loc --      Pain Edu? --      Excl. in [place]? --    No data found.  Updated Vital Signs BP (!) 97/55 (BP Location: Left Arm)   Pulse 90   Temp (!) 97.5 F (36.4 C) (Oral)   Resp 18   Ht 5\' 10"  (1.778 m)   Wt 162 lb (73.5 kg)   SpO2 91%   BMI 23.24 kg/m   Visual Acuity Right Eye Distance:   Left Eye Distance:   Bilateral Distance:    Right Eye Near:   Left Eye Near:    Bilateral Near:     Physical Exam Vitals and nursing note reviewed.  Constitutional:      Appearance: Normal appearance.  HENT:     Head: Normocephalic and  atraumatic.  Eyes:     Pupils: Pupils are equal, round, and reactive to light.  Cardiovascular:     Rate and Rhythm: Normal rate.  Pulmonary:     Effort: Pulmonary effort is normal.  Skin:    General: Skin is warm and dry.     Comments: There is an indwelling suprapubic catheter in  place.  Mild erythema to site without swelling or induration.  Mild mucopurulent discharge noted to the opening.  Area is nontender to palpation.  Neurological:     General: No focal deficit present.     Mental Status: He is alert and oriented to person, place, and time.  Psychiatric:        Mood and Affect: Mood normal.        Behavior: Behavior normal.      UC Treatments / Results  Labs (all labs ordered are listed, but only abnormal results are displayed) Labs Reviewed - No data to display  EKG   Radiology No results found.  Procedures Procedures (including critical care time)  Medications Ordered in UC Medications - No data to display  Initial Impression / Assessment and Plan / UC Course  I have reviewed the triage vital signs and the nursing notes.  Pertinent labs & imaging results that were available during my care of the patient were reviewed by me and considered in my medical decision making (see chart for details).     Reviewed exam and symptoms with patient and son.  No red flags on exam. Per son he tolerates cefdinir well and Bactrim makes him dizzy.  Will start cefdinir twice daily for 7 days Continue catheter care as needed Follow-up with urologist at scheduled appointment next week Strict ER precautions reviewed and patient and son both verbalized understanding Final Clinical Impressions(s) / UC Diagnoses   Final diagnoses:  Suprapubic catheter ([place])  Infection of bladder catheter, initial encounter [facility])     Discharge Instructions      Start cefdinir twice daily for 7 days Keep area clean and dry Please follow-up with urology as scheduled appointment next  week Please go to the emergency room for any worsening symptoms    ED Prescriptions     Medication Sig Dispense Auth. Provider   cefdinir (OMNICEF) 300 MG capsule Take 1 capsule (300 mg total) by mouth 2 (two) times daily for 7 days. 14 capsule Melynda Ripple, NP      PDMP not reviewed this encounter.   Melynda Ripple, NP 08/04/22 1556

## 2022-08-04 NOTE — ED Triage Notes (Signed)
Pt is with his son  Pt was evaluated by his daughter in law and believes it is infected. Pt has a pubic catheter that was oozing a white color for a week before turning a yellow color.   Pt daughter in law changes the catheter once a month.   Pt son states bactrim causes dizziness.

## 2022-08-04 NOTE — Discharge Instructions (Signed)
Start cefdinir twice daily for 7 days Keep area clean and dry Please follow-up with urology as scheduled appointment next week Please go to the emergency room for any worsening symptoms

## 2022-10-04 DIAGNOSIS — Z95810 Presence of automatic (implantable) cardiac defibrillator: Secondary | ICD-10-CM | POA: Insufficient documentation

## 2022-10-04 DIAGNOSIS — I5023 Acute on chronic systolic (congestive) heart failure: Secondary | ICD-10-CM | POA: Insufficient documentation

## 2022-10-11 ENCOUNTER — Other Ambulatory Visit: Admission: RE | Admit: 2022-10-11 | Payer: Medicare Other | Source: Ambulatory Visit

## 2022-10-11 ENCOUNTER — Other Ambulatory Visit
Admission: RE | Admit: 2022-10-11 | Discharge: 2022-10-11 | Disposition: A | Discharge status: Home / Self Care | Payer: Medicare Other | Source: Ambulatory Visit | Attending: Specialist | Admitting: Specialist

## 2022-10-11 DIAGNOSIS — R0602 Shortness of breath: Secondary | ICD-10-CM | POA: Insufficient documentation

## 2022-10-11 DIAGNOSIS — J84112 Idiopathic pulmonary fibrosis: Secondary | ICD-10-CM | POA: Insufficient documentation

## 2022-10-11 LAB — BRAIN NATRIURETIC PEPTIDE: B Natriuretic Peptide: 618.1 pg/mL — ABNORMAL HIGH (ref 0.0–100.0)

## 2022-12-31 IMAGING — DX DG CHEST 1V PORT
1 series · 1 of 1 positions shown · non-contrast
Comparison: None.

CLINICAL DATA: Near syncope

EXAM:
PORTABLE CHEST 1 VIEW

[chest ap]
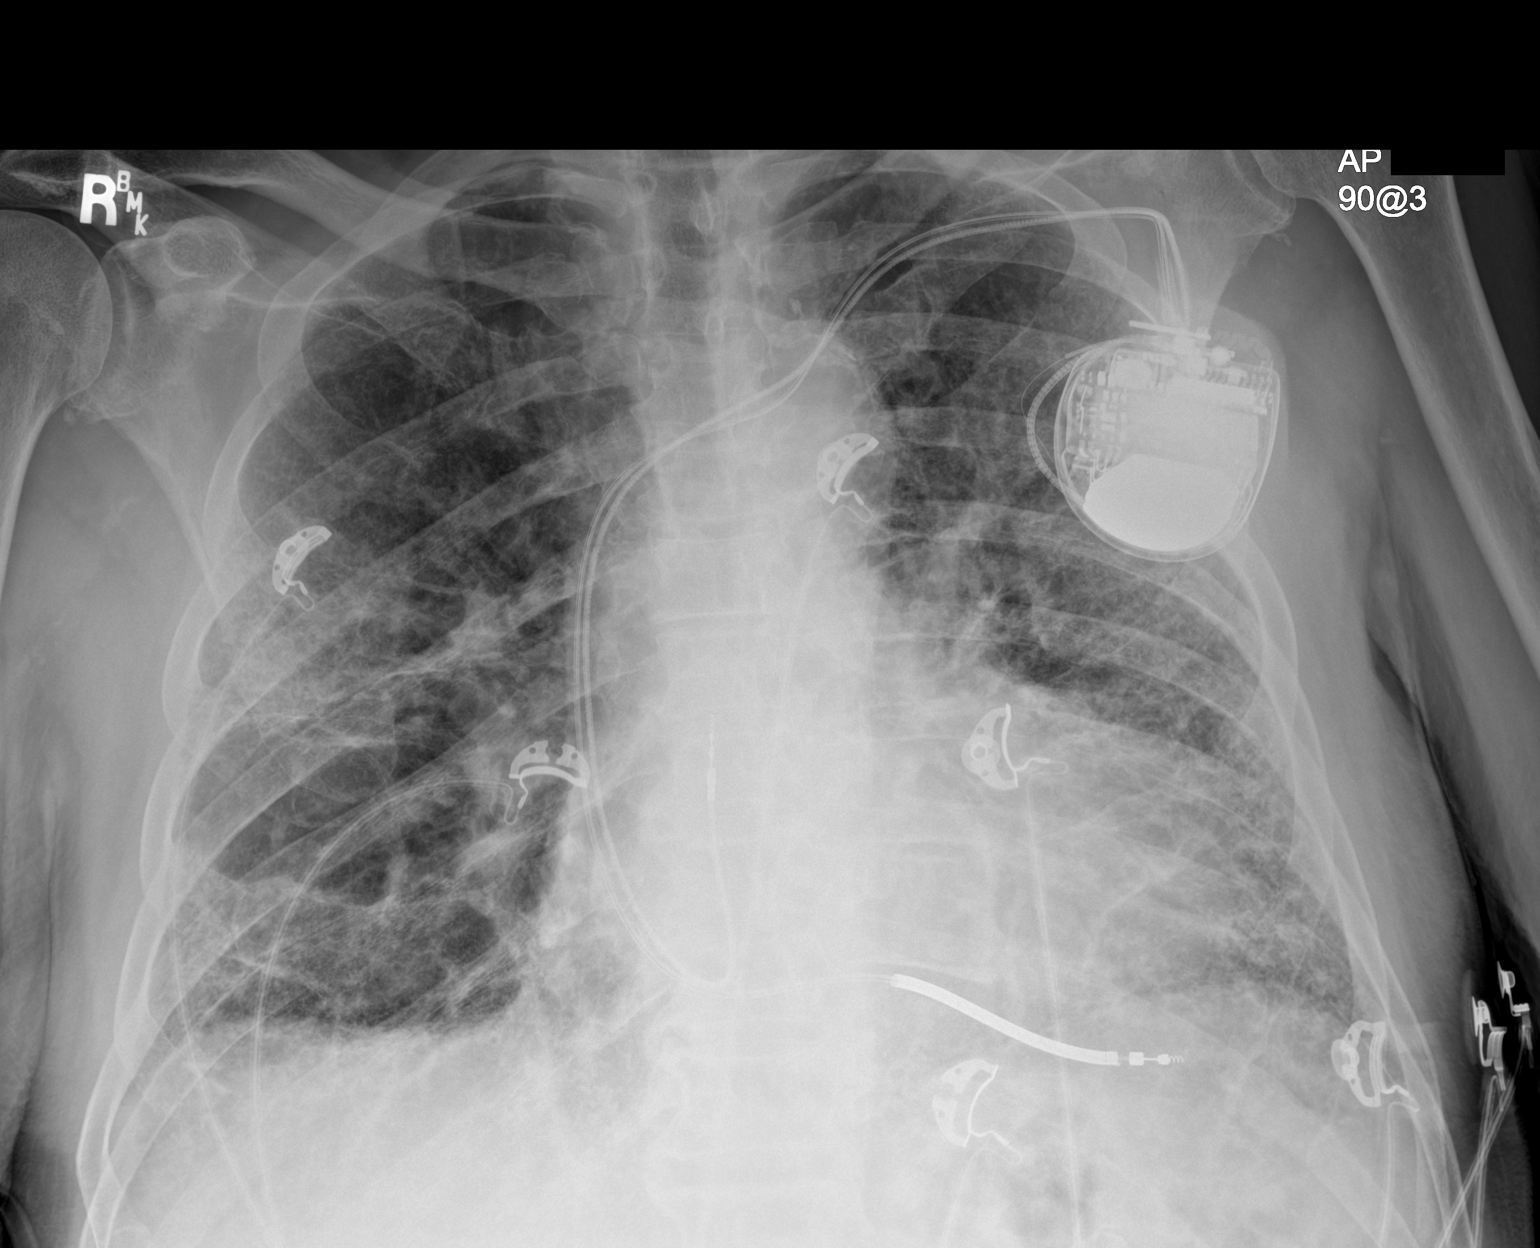

[1 of 1 positions shown; findings below may reference images not displayed]

FINDINGS: Patchy bilateral pulmonary opacity. A similar pattern was not
reported on outside chest x-ray 07/09/2020. Cardiomegaly with
dual-chamber ICD/pacer leads. No visible effusion or pneumothorax.
IMPRESSION: Patchy bilateral pulmonary infiltrate primarily worrisome for
pneumonia.

Cardiomegaly.

## 2022-12-31 IMAGING — US US CAROTID DUPLEX BILAT
1 series · 13 of 24 positions shown · non-contrast
Comparison: None.

CLINICAL DATA: Syncope

Hypertension
EXAM:
BILATERAL CAROTID DUPLEX ULTRASOUND
TECHNIQUE: Gray scale imaging, color Doppler and duplex ultrasound were
performed of bilateral carotid and vertebral arteries in the neck.

[Series 1: us carotid bilateral · 13 of 64 slices shown]
[im 1/64]
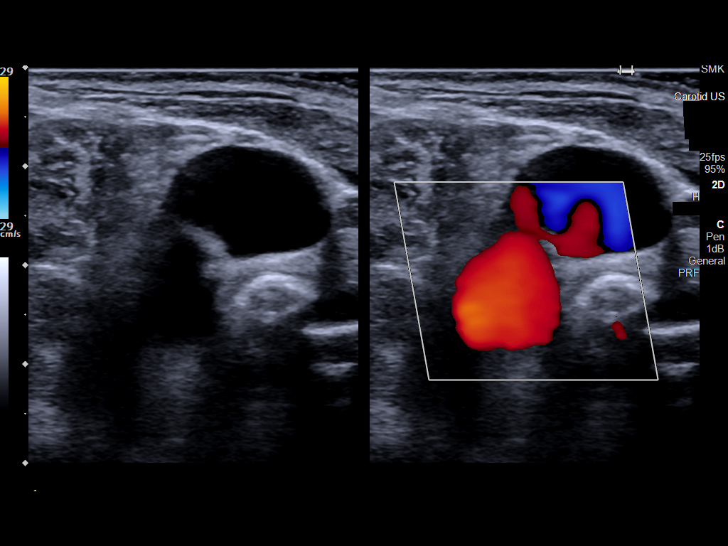
[im 6/64]
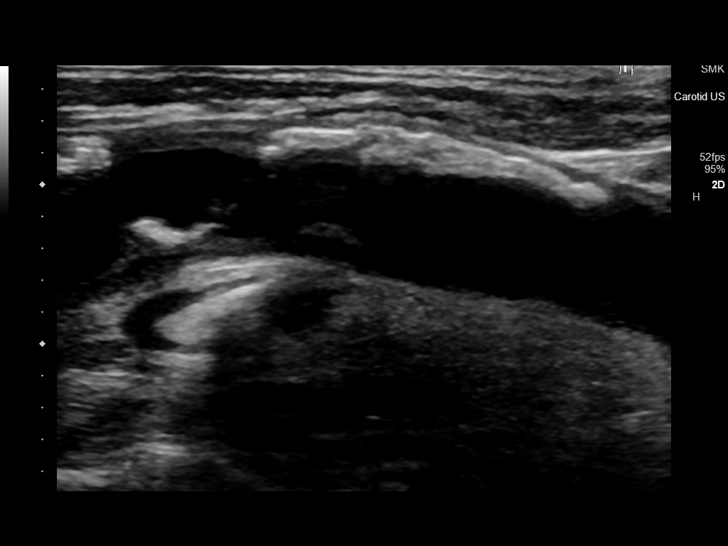
[im 11/64]
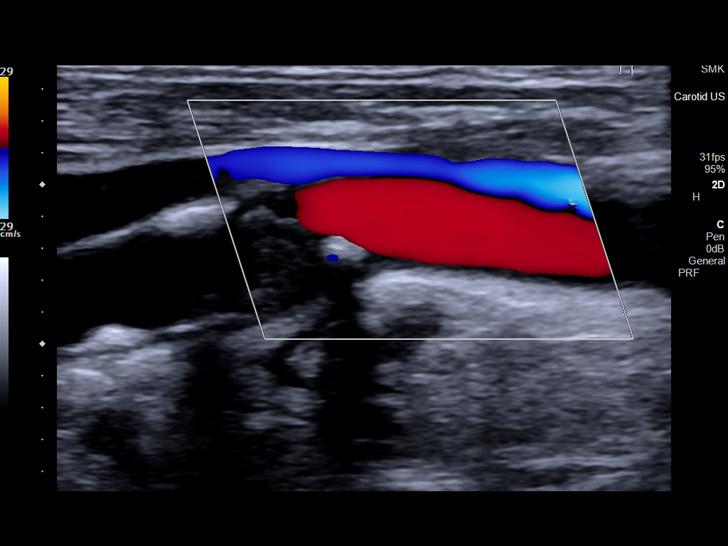
[im 17/64]
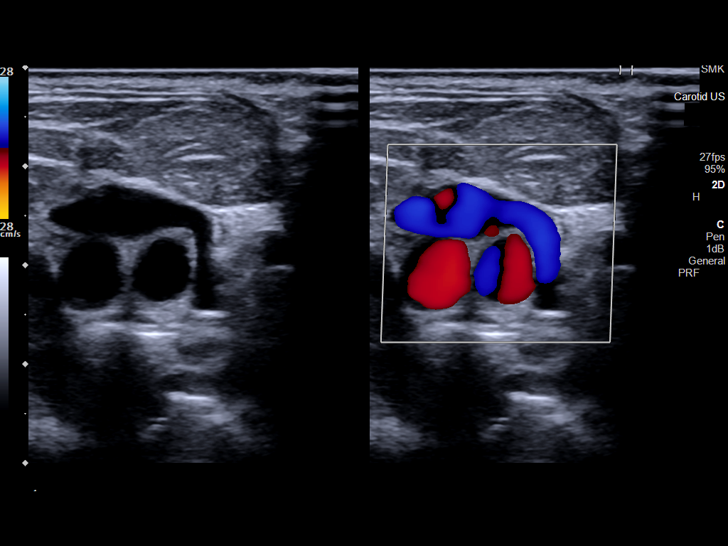
[im 22/64]
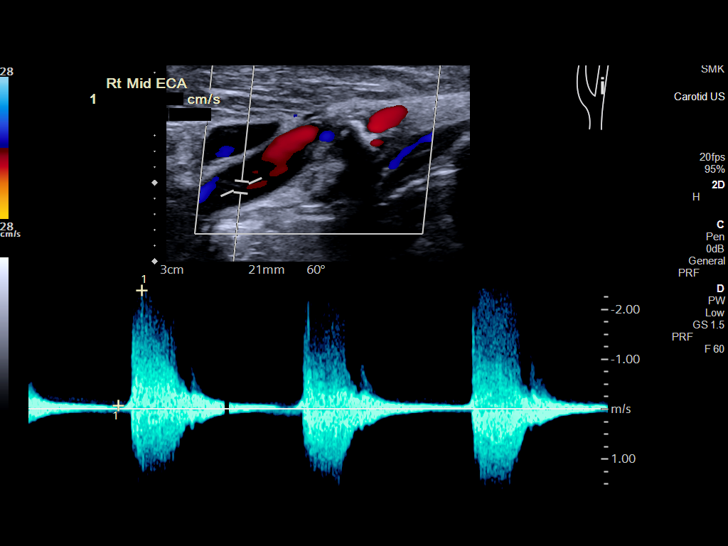
[im 28/64]
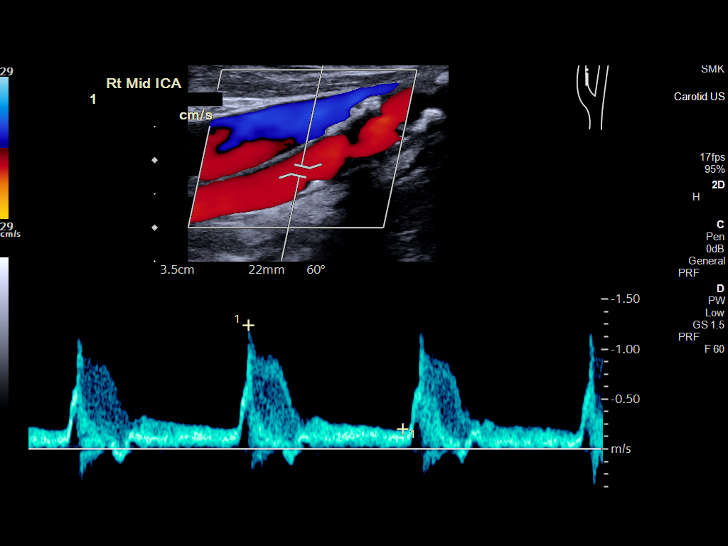
[im 33/64]
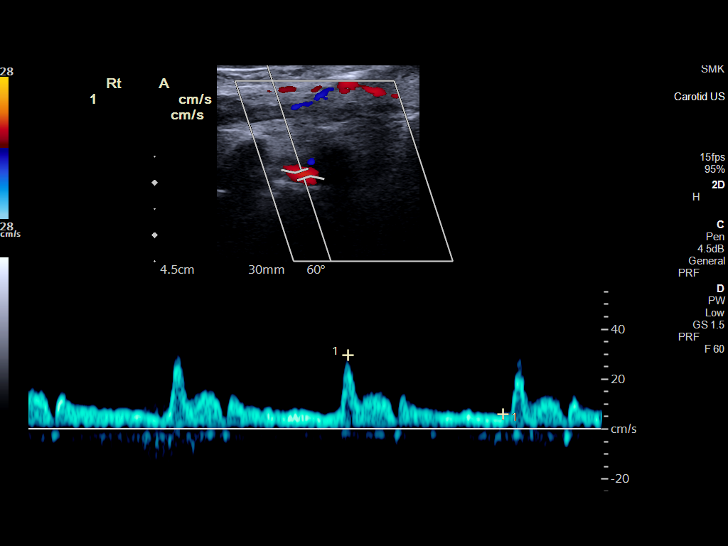
[im 36/64]
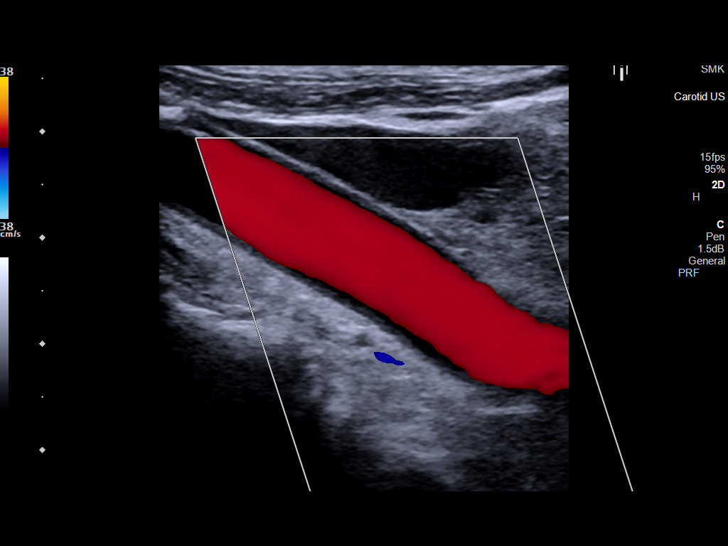
[im 42/64]
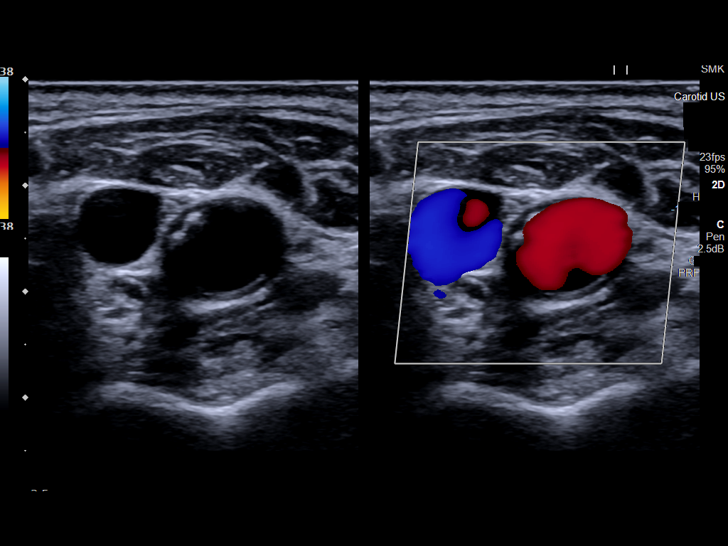
[im 47/64]
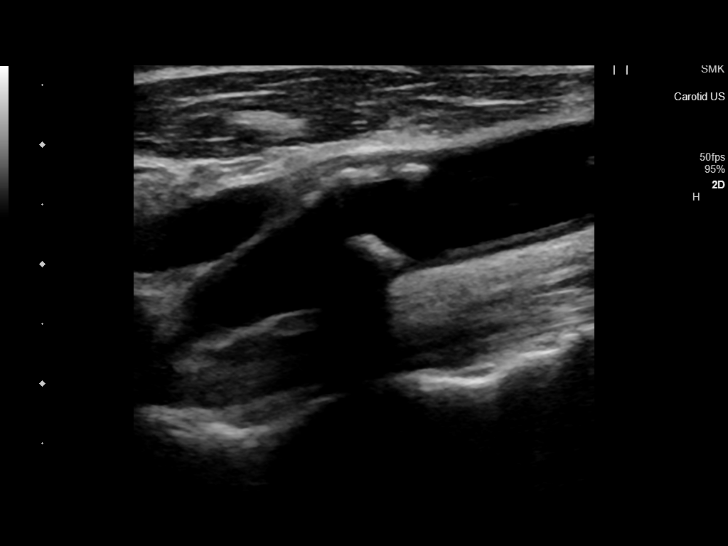
[im 53/64]
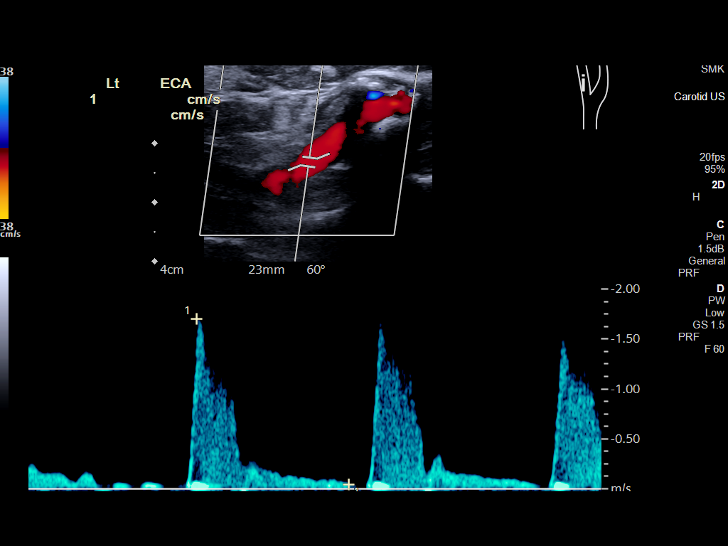
[im 58/64]
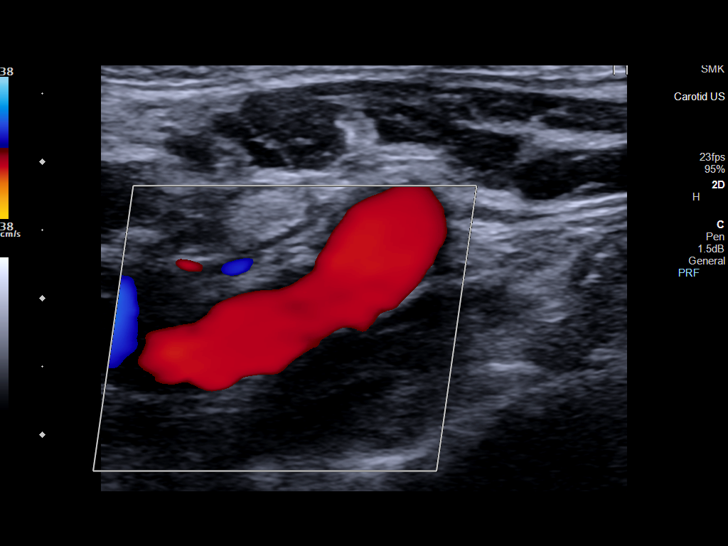
[im 64/64]
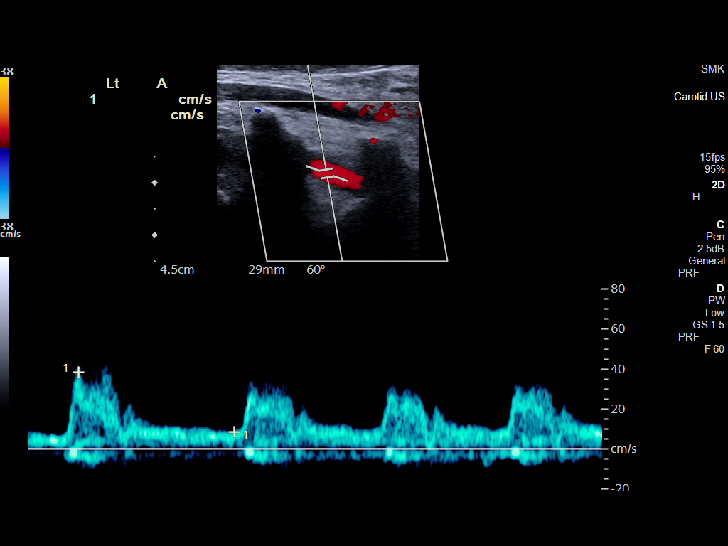

[13 of 24 positions shown; findings below may reference images not displayed]

FINDINGS: Criteria: Quantification of carotid stenosis is based on velocity
parameters that correlate the residual internal carotid diameter
with NASCET-based stenosis levels, using the diameter of the distal
internal carotid lumen as the denominator for stenosis measurement.

The following velocity measurements were obtained:

RIGHT

ICA: 124/20 cm/sec

CCA: 75/12 cm/sec

SYSTOLIC ICA/CCA RATIO:

ECA: 235 cm/sec

LEFT

ICA: 79/19 cm/sec

CCA: 117/12 cm/sec

SYSTOLIC ICA/CCA RATIO:

ECA: 170 cm/sec

RIGHT CAROTID ARTERY: Multifocal heterogeneous atheromatous plaque
of the distal common and proximal internal carotid arteries.

RIGHT VERTEBRAL ARTERY:  Antegrade flow.

LEFT CAROTID ARTERY: Multifocal heterogeneous atheromatous plaque of
the distal common and proximal internal carotid arteries.

LEFT VERTEBRAL ARTERY:  Antegrade flow.
IMPRESSION: Extensive atheromatous plaque of the distal common internal carotid
arteries. Although velocities of the internal carotid arteries are
within normal limits, presence of shadowing plaque limits
evaluation. Further evaluation with CT angiography of the neck
should be considered for better characterization of degree of
stenosis.

## 2023-01-06 ENCOUNTER — Ambulatory Visit
Admission: EM | Admit: 2023-01-06 | Discharge: 2023-01-06 | Disposition: A | Discharge status: Home / Self Care | Payer: Medicare Other | Attending: Physician Assistant | Admitting: Physician Assistant

## 2023-01-06 DIAGNOSIS — N3000 Acute cystitis without hematuria: Secondary | ICD-10-CM | POA: Diagnosis not present

## 2023-01-06 LAB — URINALYSIS, W/ REFLEX TO CULTURE (INFECTION SUSPECTED)
Bilirubin Urine: NEGATIVE
Glucose, UA: NEGATIVE mg/dL
Ketones, ur: NEGATIVE mg/dL
Nitrite: NEGATIVE
Protein, ur: 100 mg/dL — AB
Specific Gravity, Urine: 1.02 (ref 1.005–1.030)
Squamous Epithelial / HPF: NONE SEEN /HPF (ref 0–5)
pH: 6 (ref 5.0–8.0)

## 2023-01-06 MED ORDER — CEPHALEXIN 500 MG PO CAPS: 500.0000 mg | ORAL_CAPSULE | Freq: Two times a day (BID) | ORAL | 0 refills | Status: DC

## 2023-01-06 NOTE — ED Triage Notes (Signed)
Pt is with his son.  Pt c/o possible UTI. Pt son states that his wife is an [employer] fund[employer] and changes his catheter bag and noticed cloudy urine.

## 2023-01-06 NOTE — Discharge Instructions (Addendum)
You were seen today for cloudy urine with urine odor.  Your urinalysis concerning for infection.  I am starting you on antibiotics twice daily for the next 5 days.  Please increase your water intake.  Follow-up with your PCP for new or worsening symptoms.

## 2023-01-06 NOTE — ED Provider Notes (Signed)
[facility]    CSN: [id number] Arrival date & time: 01/06/23  1122      History   Chief Complaint Chief Complaint  Patient presents with   Urinary Tract Infection    HPI [name] is a 89 y.o. male with a history of urinary retention presents to urgent care today accompanied by son with complaint of cloudy urine and urine odor.  Per his son, this just started yesterday.  They noticed the cloudy urine and urine odor when changing his suprapubic catheter bag.  He denies fever, chills, nausea, vomiting, abdominal pain.  He has not noticed any blood in his urine.  His son reports that he had a UTI about 1 month ago and it seems to be associated with the device that is holding the bag on.  They have decided to stop using this type of device.  They have not tried anything OTC for this.  HPI  Past Medical History:  Diagnosis Date   CAD (coronary artery disease)    Cardiac defibrillator in place 01/2020   CHF (congestive heart failure) (HCC)    Diabetes mellitus without complication (HCC)    History of myocardial infarction 01/2020   Peripheral neuropathy    Restless leg syndrome     Patient Active Problem List   Diagnosis Date Noted   Community acquired pneumonia 10/24/2020   Acute respiratory failure with hypoxia (HCC) 10/24/2020   Acute-on-chronic kidney injury (HCC) 10/24/2020   Influenza A 10/24/2020   Elevated troponin 08/23/2020   Diabetes mellitus without complication (HCC) 08/23/2020   BPH (benign prostatic hyperplasia) 08/23/2020   Postural dizziness with presyncope 08/23/2020   AICD (automatic cardioverter/defibrillator) present 08/23/2020   Carotid stenosis 08/23/2020   Sacral decubitus ulcer 08/23/2020   Near syncope 08/23/2020   Chronic systolic CHF (congestive heart failure), NYHA class 3 (HCC) 07/20/2020   Coronary artery disease involving native coronary artery of native heart 07/20/2020   Hyperlipidemia, mixed 07/20/2020   Atrial  fibrillation (HCC) 07/06/2020    Past Surgical History:  Procedure Laterality Date   APPENDECTOMY     CARDIAC DEFIBRILLATOR PLACEMENT Left 01/2020   PARTIAL KNEE ARTHROPLASTY Left        Home Medications    Prior to Admission medications   Medication Sig Start Date End Date Taking? Authorizing Provider  acetaminophen (TYLENOL) 500 MG tablet Take 1 tablet (500 mg total) by mouth every 6 (six) hours as needed. 08/24/20  Yes Lurene Shadow, MD  albuterol (VENTOLIN HFA) 108 (90 Base) MCG/ACT inhaler Inhale 2 puffs into the lungs every 6 (six) hours as needed for wheezing or shortness of breath. Use 2 times daily x5 days, then every 6 hours as needed. 10/27/20  Yes Rodolph Bong, MD  amiodarone (PACERONE) 200 MG tablet Take 200 mg by mouth daily. 04/10/22  Yes [provider]  aspirin 81 MG EC tablet Take 81 mg by mouth daily.   Yes [provider]  brimonidine (ALPHAGAN P) 0.1 % SOLN Place 1 drop into the left eye 2 (two) times daily.   Yes [provider]  cephALEXin (KEFLEX) 500 MG capsule Take 1 capsule (500 mg total) by mouth 2 (two) times daily. 01/06/23  Yes Adi Doro, Salvadore Oxford, NP  dorzolamide (TRUSOPT) 2 % ophthalmic solution Place 1 drop into both eyes 2 (two) times daily.   Yes [provider]  ENTRESTO 24-26 MG SMARTSIG:1 Tablet(s) By Mouth Every 12 Hours 11/01/20  Yes Rodolph Bong, MD  furosemide (LASIX)  40 MG tablet Take 1 tablet (40 mg total) by mouth daily. 11/01/20  Yes Rodolph Bong, MD  gabapentin (NEURONTIN) 300 MG capsule Take 300 mg by mouth 2 (two) times daily. 07/06/20  Yes [provider]  guaiFENesin (MUCINEX) 600 MG 12 hr tablet Take 600 mg by mouth 2 (two) times daily.   Yes [provider]  latanoprost (XALATAN) 0.005 % ophthalmic solution Place 1 drop into both eyes at bedtime.   Yes [provider]  metFORMIN (GLUCOPHAGE) 500 MG tablet Take 500 mg by mouth daily with breakfast.   Yes [provider]  metoprolol succinate (TOPROL-XL) 25 MG 24 hr tablet Take 12.5 mg by mouth daily. 08/11/20  Yes [provider]  midodrine (PROAMATINE) 5 MG tablet Take 1 tablet (5 mg total) by mouth 3 (three) times daily with meals. 10/27/20  Yes Rodolph Bong, MD  Multiple Vitamin (MULTI-VITAMIN) tablet Take 1 tablet by mouth daily.   Yes [provider]  tamsulosin (FLOMAX) 0.4 MG CAPS capsule Take 0.4 mg by mouth daily.   Yes [provider]    Family History History reviewed. No pertinent family history.  Social History Social History   Tobacco Use   Smoking status: Never   Smokeless tobacco: Never  Vaping Use   Vaping Use: Never used  Substance Use Topics   Alcohol use: Not Currently   Drug use: Not Currently     Allergies   Sulfamethoxazole-trimethoprim   Review of Systems Review of Systems  Constitutional:  Negative for appetite change, chills and fever.  Respiratory:  Positive for shortness of breath. Negative for cough and chest tightness.        Has chronic shortness of breath, on continuous oxygen  Gastrointestinal:  Negative for abdominal distention, abdominal pain, nausea and vomiting.  Genitourinary:  Negative for flank pain.       Has suprapubic cath with cloudy urine and urine odor  Musculoskeletal:  Negative for arthralgias and myalgias.  Neurological:  Negative for weakness.  Psychiatric/Behavioral:  Negative for confusion.      Physical Exam Triage Vital Signs ED Triage Vitals  Enc Vitals Group     BP 01/06/23 1149 (!) 108/50     Pulse Rate 01/06/23 1149 62     Resp --      Temp 01/06/23 1149 97.7 F (36.5 C)     Temp Source 01/06/23 1149 Oral     SpO2 01/06/23 1149 94 %     Weight 01/06/23 1148 160 lb (72.6 kg)     Height 01/06/23 1148 5\' 11"  (1.803 m)     Head Circumference --      Peak Flow --      Pain Score 01/06/23 1148 0     Pain Loc --      Pain Edu? --      Excl. in [place]? --    No data  found.  Updated Vital Signs BP (!) 108/50 (BP Location: Left Arm)   Pulse 62   Temp 97.7 F (36.5 C) (Oral)   Ht 5\' 11"  (1.803 m)   Wt 160 lb (72.6 kg)   SpO2 94%   BMI 22.32 kg/m      Physical Exam Constitutional:      General: He is not in acute distress. Cardiovascular:     Rate and Rhythm: Normal rate and regular rhythm.     Heart sounds: Normal heart sounds.  Pulmonary:     Effort: Pulmonary effort is  normal.     Breath sounds: Normal breath sounds.  Abdominal:     General: Abdomen is flat.     Palpations: Abdomen is soft.     Tenderness: There is no abdominal tenderness.     Comments: Suprapubic catheter site intact without erythema  Skin:    General: Skin is warm and dry.  Neurological:     Mental Status: He is alert. Mental status is at baseline.      UC Treatments / Results  Labs Labs Reviewed  URINALYSIS, W/ REFLEX TO CULTURE (INFECTION SUSPECTED) - Abnormal; Notable for the following components:      Result Value   APPearance HAZY (*)    Hgb urine dipstick SMALL (*)    Protein, ur 100 (*)    Leukocytes,Ua SMALL (*)    Bacteria, UA FEW (*)    All other components within normal limits  URINE CULTURE      EKG   Radiology    Medications Ordered in UC Medications - No data to display  Initial Impression / Assessment and Plan / UC Course  I have reviewed the triage vital signs and the nursing notes.  Pertinent labs & imaging results that were available during my care of the patient were reviewed by me and considered in my medical decision making (see chart for details).     89 year old male with suprapubic catheter with 1 day history of cloudy urine and urine odor.  He is nontoxic-appearing.  Urinalysis concerning for infection.  Will reflex to urine culture.  Will treat with Keflex 500 mg twice daily x 7 days.  Encourage adequate water intake.  Follow-up with your PCP for new or worsening symptoms.  Final Clinical Impressions(s) / UC  Diagnoses   Final diagnoses:  Acute cystitis without hematuria     Discharge Instructions      You were seen today for cloudy urine with urine odor.  Your urinalysis concerning for infection.  I am starting you on antibiotics twice daily for the next 5 days.  Please increase your water intake.  Follow-up with your PCP for new or worsening symptoms.     ED Prescriptions     Medication Sig Dispense Auth. Provider   cephALEXin (KEFLEX) 500 MG capsule Take 1 capsule (500 mg total) by mouth 2 (two) times daily. 14 capsule Lorre Munroe, NP      PDMP not reviewed this encounter.   Lorre Munroe, NP 01/06/23 1219

## 2023-01-07 LAB — URINE CULTURE: Culture: 10000 — AB

## 2023-01-08 ENCOUNTER — Telehealth (HOSPITAL_COMMUNITY): Payer: Self-pay | Admitting: Emergency Medicine

## 2023-01-08 LAB — URINE CULTURE

## 2023-01-08 MED ORDER — CEFUROXIME AXETIL 250 MG PO TABS: 250.0000 mg | ORAL_TABLET | Freq: Two times a day (BID) | ORAL | 0 refills | Status: AC

## 2023-02-24 ENCOUNTER — Ambulatory Visit
Admission: RE | Admit: 2023-02-24 | Discharge: 2023-02-24 | Disposition: A | Discharge status: Home / Self Care | Payer: Medicare Other | Source: Ambulatory Visit

## 2023-02-24 VITALS — BP 106/61 | HR 62 | Temp 97.5°F | Resp 20 | Wt 158.0 lb

## 2023-02-24 DIAGNOSIS — T83510A Infection and inflammatory reaction due to cystostomy catheter, initial encounter: Secondary | ICD-10-CM

## 2023-02-24 DIAGNOSIS — N390 Urinary tract infection, site not specified: Secondary | ICD-10-CM

## 2023-02-24 LAB — URINALYSIS, W/ REFLEX TO CULTURE (INFECTION SUSPECTED)
Bilirubin Urine: NEGATIVE
Glucose, UA: NEGATIVE mg/dL
Ketones, ur: NEGATIVE mg/dL
Nitrite: POSITIVE — AB
Protein, ur: 100 mg/dL — AB
Specific Gravity, Urine: 1.02 (ref 1.005–1.030)
WBC, UA: >50 WBC/hpf (ref 0–5)
pH: 7 (ref 5.0–8.0)

## 2023-02-24 MED ORDER — CEFDINIR 300 MG PO CAPS: 300.0000 mg | ORAL_CAPSULE | Freq: Two times a day (BID) | ORAL | 0 refills | Status: AC

## 2023-02-24 NOTE — ED Provider Notes (Signed)
[facility]    CSN: [id number] Arrival date & time: 02/24/23  1101      History   Chief Complaint Chief Complaint  Patient presents with   Urinary Frequency    He has a uti we believe - Entered by patient    HPI [name] is a 89 y.o. male.   HPI  89 year old male with a past medical history significant for CAD with AICD placement, CHF, diabetes, history of MI, and RLS presents for evaluation of possible UTI.  He has an indwelling suprapubic catheter and it was most recently changed approximately 3 to 4 days ago.  The patient reports that he is having to urinate more frequently and also he has noticed an odor to his urine.  He denies any fever, change in mental status, nausea or vomiting, or blood in his urine.  Past Medical History:  Diagnosis Date   CAD (coronary artery disease)    Cardiac defibrillator in place 01/2020   CHF (congestive heart failure) (HCC)    Diabetes mellitus without complication (HCC)    History of myocardial infarction 01/2020   Peripheral neuropathy    Restless leg syndrome     Patient Active Problem List   Diagnosis Date Noted   Community acquired pneumonia 10/24/2020   Acute respiratory failure with hypoxia (HCC) 10/24/2020   Acute-on-chronic kidney injury (HCC) 10/24/2020   Influenza A 10/24/2020   Elevated troponin 08/23/2020   Diabetes mellitus without complication (HCC) 08/23/2020   BPH (benign prostatic hyperplasia) 08/23/2020   Postural dizziness with presyncope 08/23/2020   AICD (automatic cardioverter/defibrillator) present 08/23/2020   Carotid stenosis 08/23/2020   Sacral decubitus ulcer 08/23/2020   Near syncope 08/23/2020   Chronic systolic CHF (congestive heart failure), NYHA class 3 (HCC) 07/20/2020   Coronary artery disease involving native coronary artery of native heart 07/20/2020   Hyperlipidemia, mixed 07/20/2020   Atrial fibrillation (HCC) 07/06/2020    Past Surgical History:  Procedure  Laterality Date   APPENDECTOMY     CARDIAC DEFIBRILLATOR PLACEMENT Left 01/2020   PARTIAL KNEE ARTHROPLASTY Left        Home Medications    Prior to Admission medications   Medication Sig Start Date End Date Taking? Authorizing Provider  cefdinir (OMNICEF) 300 MG capsule Take 1 capsule (300 mg total) by mouth 2 (two) times daily for 10 days. 02/24/23 03/06/23 Yes Becky Augusta, NP  acetaminophen (TYLENOL) 500 MG tablet Take 1 tablet (500 mg total) by mouth every 6 (six) hours as needed. 08/24/20   Lurene Shadow, MD  albuterol (VENTOLIN HFA) 108 (90 Base) MCG/ACT inhaler Inhale 2 puffs into the lungs every 6 (six) hours as needed for wheezing or shortness of breath. Use 2 times daily x5 days, then every 6 hours as needed. 10/27/20   Rodolph Bong, MD  amiodarone (PACERONE) 200 MG tablet Take 200 mg by mouth daily. 04/10/22   [provider]  aspirin 81 MG EC tablet Take 81 mg by mouth daily.    [provider]  brimonidine (ALPHAGAN P) 0.1 % SOLN Place 1 drop into the left eye 2 (two) times daily.    [provider]  dorzolamide (TRUSOPT) 2 % ophthalmic solution Place 1 drop into both eyes 2 (two) times daily.    [provider]  ENTRESTO 24-26 MG SMARTSIG:1 Tablet(s) By Mouth Every 12 Hours 11/01/20   Rodolph Bong, MD  furosemide (LASIX) 40 MG tablet Take 1 tablet (40 mg total) by mouth daily.  11/01/20   Rodolph Bong, MD  gabapentin (NEURONTIN) 300 MG capsule Take 300 mg by mouth 2 (two) times daily. 07/06/20   [provider]  guaiFENesin (MUCINEX) 600 MG 12 hr tablet Take 600 mg by mouth 2 (two) times daily.    [provider]  latanoprost (XALATAN) 0.005 % ophthalmic solution Place 1 drop into both eyes at bedtime.    [provider]  metFORMIN (GLUCOPHAGE) 500 MG tablet Take 500 mg by mouth daily with breakfast.    [provider]  metoprolol succinate (TOPROL-XL) 25 MG 24 hr tablet Take 12.5 mg by mouth daily.  08/11/20   [provider]  midodrine (PROAMATINE) 5 MG tablet Take 1 tablet (5 mg total) by mouth 3 (three) times daily with meals. 10/27/20   Rodolph Bong, MD  Multiple Vitamin (MULTI-VITAMIN) tablet Take 1 tablet by mouth daily.    [provider]  tamsulosin (FLOMAX) 0.4 MG CAPS capsule Take 0.4 mg by mouth daily.    [provider]    Family History History reviewed. No pertinent family history.  Social History Social History   Tobacco Use   Smoking status: Never   Smokeless tobacco: Never  Vaping Use   Vaping status: Never Used  Substance Use Topics   Alcohol use: Not Currently   Drug use: Not Currently     Allergies   Sulfamethoxazole-trimethoprim   Review of Systems Review of Systems  Constitutional:  Negative for fever.  Gastrointestinal:  Negative for nausea and vomiting.  Genitourinary:  Positive for frequency. Negative for hematuria.  Psychiatric/Behavioral:  Negative for confusion.      Physical Exam Triage Vital Signs ED Triage Vitals  Encounter Vitals Group     BP 02/24/23 1115 106/61     Systolic BP Percentile --      Diastolic BP Percentile --      Pulse Rate 02/24/23 1115 62     Resp 02/24/23 1115 20     Temp 02/24/23 1113 (!) 97.5 F (36.4 C)     Temp Source 02/24/23 1115 Oral     SpO2 02/24/23 1115 (!) 89 %     Weight 02/24/23 1112 158 lb (71.7 kg)     Height --      Head Circumference --      Peak Flow --      Pain Score 02/24/23 1112 0     Pain Loc --      Pain Education --      Exclude from Growth Chart --    No data found.  Updated Vital Signs BP 106/61 (BP Location: Left Arm)   Pulse 62   Temp (!) 97.5 F (36.4 C) (Oral)   Resp 20   Wt 158 lb (71.7 kg)   SpO2 (!) 89%   BMI 22.04 kg/m   Visual Acuity Right Eye Distance:   Left Eye Distance:   Bilateral Distance:    Right Eye Near:   Left Eye Near:    Bilateral Near:     Physical Exam Vitals and nursing note reviewed.   Constitutional:      Appearance: Normal appearance. He is not ill-appearing.  HENT:     Head: Normocephalic and atraumatic.  Cardiovascular:     Rate and Rhythm: Normal rate and regular rhythm.     Pulses: Normal pulses.     Heart sounds: Normal heart sounds. No murmur heard.    No friction rub. No gallop.  Pulmonary:  Effort: Pulmonary effort is normal.     Breath sounds: Normal breath sounds. No wheezing, rhonchi or rales.  Abdominal:     Tenderness: There is no right CVA tenderness or left CVA tenderness.  Skin:    General: Skin is warm and dry.     Capillary Refill: Capillary refill takes less than 2 seconds.     Findings: No rash.  Neurological:     General: No focal deficit present.     Mental Status: He is alert and oriented to person, place, and time.      UC Treatments / Results  Labs (all labs ordered are listed, but only abnormal results are displayed) Labs Reviewed  URINALYSIS, W/ REFLEX TO CULTURE (INFECTION SUSPECTED) - Abnormal; Notable for the following components:      Result Value   APPearance CLOUDY (*)    Hgb urine dipstick MODERATE (*)    Protein, ur 100 (*)    Nitrite POSITIVE (*)    Leukocytes,Ua LARGE (*)    Bacteria, UA MANY (*)    All other components within normal limits  URINE CULTURE    EKG   Radiology No results found.  Procedures Procedures (including critical care time)  Medications Ordered in UC Medications - No data to display  Initial Impression / Assessment and Plan / UC Course  I have reviewed the triage vital signs and the nursing notes.  Pertinent labs & imaging results that were available during my care of the patient were reviewed by me and considered in my medical decision making (see chart for details).   Patient is a nontoxic-appearing 89 year old male presenting for evaluation for possible UTI given the increased urinary frequency but no other symptoms.  He does have an indwelling suprapubic catheter that was  changed 3 to 4 days ago.  His daughter is a Engineer, civil (consulting) who does the changing.  He is reporting increased urinary frequency as well as an odor to the urine.  A specimen was collected after cleaning off the discharge port of the Foley catheter with alcohol.  The urine does have a strong odor and it is cloudy in appearance.  No appreciable blood.  I will send urine for urinalysis.  Urinalysis shows cloudy appearance with moderate hemoglobin, 100 protein, nitrite positive with large leukocyte esterase.  Reflex microscopy shows greater than 50 WBCs, 21-50 RBCs, many bacteria.  Urine will reflex to culture.  I will discharge patient home with a diagnosis of UTI and started on cefdinir 300 mg twice daily for 10 days for treatment of catheter associated UTI.  ER precautions given to include development of fever, nausea, vomiting, or changes in mental status.   Final Clinical Impressions(s) / UC Diagnoses   Final diagnoses:  Urinary tract infection associated with cystostomy catheter, initial encounter [facility])     Discharge Instructions      Take the cefdinir 300 mg twice daily for 10 days for treatment of your UTI.  Increase your oral fluid intake such increased urine production and help flush your urinary tract.  We will send the urine for culture to see what bacteria is the causative agent.  If cefdinir will not treat the bacteria we will call you and make an adjustment based upon those results.  If you develop any fever, belly pain, back pain, nausea and vomiting and cannot keep down fluids or medications, or changes in mental status you need to go to the ER for evaluation.     ED Prescriptions  Medication Sig Dispense Auth. Provider   cefdinir (OMNICEF) 300 MG capsule Take 1 capsule (300 mg total) by mouth 2 (two) times daily for 10 days. 20 capsule Becky Augusta, NP      PDMP not reviewed this encounter.   Becky Augusta, NP 02/24/23 1234

## 2023-02-24 NOTE — ED Triage Notes (Signed)
Pt presents with UTI. Daughter in law is a nurse who changes his cath. Smell in urine.

## 2023-02-24 NOTE — Discharge Instructions (Signed)
Take the cefdinir 300 mg twice daily for 10 days for treatment of your UTI.  Increase your oral fluid intake such increased urine production and help flush your urinary tract.  We will send the urine for culture to see what bacteria is the causative agent.  If cefdinir will not treat the bacteria we will call you and make an adjustment based upon those results.  If you develop any fever, belly pain, back pain, nausea and vomiting and cannot keep down fluids or medications, or changes in mental status you need to go to the ER for evaluation.

## 2023-02-26 LAB — URINE CULTURE: Culture: >=100000 — AB

## 2023-04-15 ENCOUNTER — Other Ambulatory Visit: Payer: Self-pay

## 2023-04-15 ENCOUNTER — Inpatient Hospital Stay
Admission: EM | Admit: 2023-04-15 | Discharge: 2023-04-17 | DRG: 309 | Disposition: A | Discharge status: Home Health | Payer: Medicare Other | Attending: Internal Medicine | Admitting: Internal Medicine

## 2023-04-15 ENCOUNTER — Emergency Department: Payer: Medicare Other

## 2023-04-15 DIAGNOSIS — E1165 Type 2 diabetes mellitus with hyperglycemia: Secondary | ICD-10-CM | POA: Diagnosis present

## 2023-04-15 DIAGNOSIS — Z4502 Encounter for adjustment and management of automatic implantable cardiac defibrillator: Principal | ICD-10-CM

## 2023-04-15 DIAGNOSIS — E1140 Type 2 diabetes mellitus with diabetic neuropathy, unspecified: Secondary | ICD-10-CM | POA: Diagnosis present

## 2023-04-15 DIAGNOSIS — I259 Chronic ischemic heart disease, unspecified: Secondary | ICD-10-CM

## 2023-04-15 DIAGNOSIS — Z882 Allergy status to sulfonamides status: Secondary | ICD-10-CM

## 2023-04-15 DIAGNOSIS — I5023 Acute on chronic systolic (congestive) heart failure: Secondary | ICD-10-CM

## 2023-04-15 DIAGNOSIS — Z96652 Presence of left artificial knee joint: Secondary | ICD-10-CM | POA: Diagnosis present

## 2023-04-15 DIAGNOSIS — R7989 Other specified abnormal findings of blood chemistry: Secondary | ICD-10-CM

## 2023-04-15 DIAGNOSIS — N400 Enlarged prostate without lower urinary tract symptoms: Secondary | ICD-10-CM | POA: Diagnosis present

## 2023-04-15 DIAGNOSIS — E785 Hyperlipidemia, unspecified: Secondary | ICD-10-CM | POA: Diagnosis present

## 2023-04-15 DIAGNOSIS — Z66 Do not resuscitate: Secondary | ICD-10-CM | POA: Diagnosis not present

## 2023-04-15 DIAGNOSIS — N179 Acute kidney failure, unspecified: Secondary | ICD-10-CM | POA: Diagnosis present

## 2023-04-15 DIAGNOSIS — I252 Old myocardial infarction: Secondary | ICD-10-CM

## 2023-04-15 DIAGNOSIS — I4891 Unspecified atrial fibrillation: Secondary | ICD-10-CM | POA: Diagnosis present

## 2023-04-15 DIAGNOSIS — Z79899 Other long term (current) drug therapy: Secondary | ICD-10-CM

## 2023-04-15 DIAGNOSIS — I5022 Chronic systolic (congestive) heart failure: Secondary | ICD-10-CM | POA: Diagnosis present

## 2023-04-15 DIAGNOSIS — G2581 Restless legs syndrome: Secondary | ICD-10-CM | POA: Diagnosis present

## 2023-04-15 DIAGNOSIS — Z7982 Long term (current) use of aspirin: Secondary | ICD-10-CM

## 2023-04-15 DIAGNOSIS — J849 Interstitial pulmonary disease, unspecified: Secondary | ICD-10-CM | POA: Diagnosis present

## 2023-04-15 DIAGNOSIS — I6529 Occlusion and stenosis of unspecified carotid artery: Secondary | ICD-10-CM | POA: Diagnosis present

## 2023-04-15 DIAGNOSIS — I6523 Occlusion and stenosis of bilateral carotid arteries: Secondary | ICD-10-CM | POA: Diagnosis present

## 2023-04-15 DIAGNOSIS — Z9981 Dependence on supplemental oxygen: Secondary | ICD-10-CM

## 2023-04-15 DIAGNOSIS — I472 Ventricular tachycardia, unspecified: Secondary | ICD-10-CM | POA: Diagnosis not present

## 2023-04-15 DIAGNOSIS — E1122 Type 2 diabetes mellitus with diabetic chronic kidney disease: Secondary | ICD-10-CM | POA: Diagnosis present

## 2023-04-15 DIAGNOSIS — I9589 Other hypotension: Secondary | ICD-10-CM | POA: Diagnosis present

## 2023-04-15 DIAGNOSIS — N1831 Chronic kidney disease, stage 3a: Secondary | ICD-10-CM | POA: Diagnosis present

## 2023-04-15 DIAGNOSIS — Z95810 Presence of automatic (implantable) cardiac defibrillator: Secondary | ICD-10-CM

## 2023-04-15 DIAGNOSIS — J9611 Chronic respiratory failure with hypoxia: Secondary | ICD-10-CM | POA: Diagnosis present

## 2023-04-15 DIAGNOSIS — I2510 Atherosclerotic heart disease of native coronary artery without angina pectoris: Secondary | ICD-10-CM | POA: Diagnosis present

## 2023-04-15 DIAGNOSIS — I480 Paroxysmal atrial fibrillation: Secondary | ICD-10-CM | POA: Diagnosis present

## 2023-04-15 DIAGNOSIS — I714 Abdominal aortic aneurysm, without rupture, unspecified: Secondary | ICD-10-CM | POA: Diagnosis present

## 2023-04-15 DIAGNOSIS — Z7984 Long term (current) use of oral hypoglycemic drugs: Secondary | ICD-10-CM

## 2023-04-15 LAB — COMPREHENSIVE METABOLIC PANEL
ALT: 10 U/L (ref 0–44)
AST: 24 U/L (ref 15–41)
Albumin: 3.6 g/dL (ref 3.5–5.0)
Alkaline Phosphatase: 72 U/L (ref 38–126)
Anion gap: 11 (ref 5–15)
BUN: 42 mg/dL — ABNORMAL HIGH (ref 8–23)
CO2: 25 mmol/L (ref 22–32)
Calcium: 8.7 mg/dL — ABNORMAL LOW (ref 8.9–10.3)
Chloride: 101 mmol/L (ref 98–111)
Creatinine, Ser: 1.47 mg/dL — ABNORMAL HIGH (ref 0.61–1.24)
GFR, Estimated: 44 mL/min — ABNORMAL LOW (ref >60)
Glucose, Bld: 264 mg/dL — ABNORMAL HIGH (ref 70–99)
Potassium: 4.3 mmol/L (ref 3.5–5.1)
Sodium: 137 mmol/L (ref 135–145)
Total Bilirubin: 0.7 mg/dL (ref 0.3–1.2)
Total Protein: 7.9 g/dL (ref 6.5–8.1)

## 2023-04-15 LAB — CBC WITH DIFFERENTIAL/PLATELET
Abs Immature Granulocytes: 0.02 10*3/uL (ref 0.00–0.07)
Basophils Absolute: 0.1 10*3/uL (ref 0.0–0.1)
Basophils Relative: 1 %
Eosinophils Absolute: 0.6 10*3/uL — ABNORMAL HIGH (ref 0.0–0.5)
Eosinophils Relative: 6 %
HCT: 42 % (ref 39.0–52.0)
Hemoglobin: 13.3 g/dL (ref 13.0–17.0)
Immature Granulocytes: 0 %
Lymphocytes Relative: 24 %
Lymphs Abs: 2.3 10*3/uL (ref 0.7–4.0)
MCH: 33.9 pg (ref 26.0–34.0)
MCHC: 31.7 g/dL (ref 30.0–36.0)
MCV: 107.1 fL — ABNORMAL HIGH (ref 80.0–100.0)
Monocytes Absolute: 1 10*3/uL (ref 0.1–1.0)
Monocytes Relative: 10 %
Neutro Abs: 5.5 10*3/uL (ref 1.7–7.7)
Neutrophils Relative %: 59 %
Platelets: 164 10*3/uL (ref 150–400)
RBC: 3.92 MIL/uL — ABNORMAL LOW (ref 4.22–5.81)
RDW: 12.6 % (ref 11.5–15.5)
Smear Review: NORMAL
WBC: 9.5 10*3/uL (ref 4.0–10.5)
nRBC: 0 % (ref 0.0–0.2)

## 2023-04-15 LAB — TROPONIN I (HIGH SENSITIVITY): Troponin I (High Sensitivity): 201 ng/L (ref <18)

## 2023-04-15 LAB — MAGNESIUM: Magnesium: 2.2 mg/dL (ref 1.7–2.4)

## 2023-04-15 NOTE — ED Triage Notes (Signed)
Pt BIB [facility] from home after defibrillator fired this evening. Pt states that he was sitting in his recliner and he felt a shock. No CP, No SHOB.  VSS  CBG 233

## 2023-04-15 NOTE — ED Provider Notes (Signed)
[facility] Provider Note    Event Date/Time   First MD Initiated Contact with Patient 04/15/23 2304     (approximate)   History   AICD firing   HPI  [name] is a 89 y.o. male brought to the ED via EMS from home after his defibrillator fired prior to arrival.  Patient had just gotten up to use the restroom and was returning to the recliner when he felt his defibrillator fire once.  Denies preceding fever/chills, cough, chest pain, shortness of breath, abdominal pain, nausea, vomiting or dizziness.     Past Medical History   Past Medical History:  Diagnosis Date   CAD (coronary artery disease)    Cardiac defibrillator in place 01/2020   CHF (congestive heart failure) (HCC)    Diabetes mellitus without complication (HCC)    History of myocardial infarction 01/2020   Peripheral neuropathy    Restless leg syndrome      Active Problem List   Patient Active Problem List   Diagnosis Date Noted   Community acquired pneumonia 10/24/2020   Acute respiratory failure with hypoxia (HCC) 10/24/2020   Acute-on-chronic kidney injury (HCC) 10/24/2020   Influenza A 10/24/2020   Elevated troponin 08/23/2020   Diabetes mellitus without complication (HCC) 08/23/2020   BPH (benign prostatic hyperplasia) 08/23/2020   Postural dizziness with presyncope 08/23/2020   AICD (automatic cardioverter/defibrillator) present 08/23/2020   Carotid stenosis 08/23/2020   Sacral decubitus ulcer 08/23/2020   Near syncope 08/23/2020   Chronic systolic CHF (congestive heart failure), NYHA class 3 (HCC) 07/20/2020   Coronary artery disease involving native coronary artery of native heart 07/20/2020   Hyperlipidemia, mixed 07/20/2020   Atrial fibrillation (HCC) 07/06/2020     Past Surgical History   Past Surgical History:  Procedure Laterality Date   APPENDECTOMY     CARDIAC DEFIBRILLATOR PLACEMENT Left 01/2020   PARTIAL KNEE ARTHROPLASTY Left      Home  Medications   Prior to Admission medications   Medication Sig Start Date End Date Taking? Authorizing Provider  acetaminophen (TYLENOL) 500 MG tablet Take 1 tablet (500 mg total) by mouth every 6 (six) hours as needed. 08/24/20   Lurene Shadow, MD  albuterol (VENTOLIN HFA) 108 (90 Base) MCG/ACT inhaler Inhale 2 puffs into the lungs every 6 (six) hours as needed for wheezing or shortness of breath. Use 2 times daily x5 days, then every 6 hours as needed. 10/27/20   Rodolph Bong, MD  amiodarone (PACERONE) 200 MG tablet Take 200 mg by mouth daily. 04/10/22   [provider]  aspirin 81 MG EC tablet Take 81 mg by mouth daily.    [provider]  brimonidine (ALPHAGAN P) 0.1 % SOLN Place 1 drop into the left eye 2 (two) times daily.    [provider]  dorzolamide (TRUSOPT) 2 % ophthalmic solution Place 1 drop into both eyes 2 (two) times daily.    [provider]  ENTRESTO 24-26 MG SMARTSIG:1 Tablet(s) By Mouth Every 12 Hours 11/01/20   Rodolph Bong, MD  furosemide (LASIX) 40 MG tablet Take 1 tablet (40 mg total) by mouth daily. 11/01/20   Rodolph Bong, MD  gabapentin (NEURONTIN) 300 MG capsule Take 300 mg by mouth 2 (two) times daily. 07/06/20   [provider]  guaiFENesin (MUCINEX) 600 MG 12 hr tablet Take 600 mg by mouth 2 (two) times daily.    [provider]  latanoprost (XALATAN) 0.005 % ophthalmic solution  Place 1 drop into both eyes at bedtime.    [provider]  metFORMIN (GLUCOPHAGE) 500 MG tablet Take 500 mg by mouth daily with breakfast.    [provider]  metoprolol succinate (TOPROL-XL) 25 MG 24 hr tablet Take 12.5 mg by mouth daily. 08/11/20   [provider]  midodrine (PROAMATINE) 5 MG tablet Take 1 tablet (5 mg total) by mouth 3 (three) times daily with meals. 10/27/20   Rodolph Bong, MD  Multiple Vitamin (MULTI-VITAMIN) tablet Take 1 tablet by mouth daily.    [provider]   tamsulosin (FLOMAX) 0.4 MG CAPS capsule Take 0.4 mg by mouth daily.    [provider]     Allergies  Sulfamethoxazole-trimethoprim   Family History  History reviewed. No pertinent family history.   Physical Exam  Triage Vital Signs: ED Triage Vitals  Encounter Vitals Group     BP      Systolic BP Percentile      Diastolic BP Percentile      Pulse      Resp      Temp      Temp src      SpO2      Weight      Height      Head Circumference      Peak Flow      Pain Score      Pain Loc      Pain Education      Exclude from Growth Chart     Updated Vital Signs: BP 91/63   Pulse 89   Temp 97.8 F (36.6 C)   Resp (!) 27   Ht 5\' 11"  (1.803 m)   Wt 71.7 kg   SpO2 99%   BMI 22.04 kg/m    General: Awake, no distress.  CV:  RRR.  Good peripheral perfusion.  Resp:  Normal effort.  CTAB. Abd:  Nontender.  No distention.  Other:  Bilateral calves are supple without tenderness.   ED Results / Procedures / Treatments  Labs (all labs ordered are listed, but only abnormal results are displayed) Labs Reviewed  CBC WITH DIFFERENTIAL/PLATELET  COMPREHENSIVE METABOLIC PANEL  MAGNESIUM  TROPONIN I (HIGH SENSITIVITY)     EKG  ED ECG REPORT I, Alysah Carton J, the attending physician, personally viewed and interpreted this ECG.   Date: 04/15/2023  EKG Time: ***  Rate: ***  Rhythm: {ekg findings:315101}  Axis: ***  Intervals:{conduction defects:17367}  ST&T Change: ***    RADIOLOGY I have independently visualized and interpreted patient's imaging study as well as noted the radiology interpretation:  {You MUST document your own interpretation of imaging, as well as the fact that you reviewed the radiologist's report!:1}  Official radiology report(s): No results found.   PROCEDURES:  Critical Care performed: {CriticalCareYesNo:19197::"Yes, see critical care procedure note(s)","No"}  .1-3 Lead EKG Interpretation  Performed by: Irean Hong,  MD Authorized by: Irean Hong, MD     Interpretation: normal     ECG rate:  90   ECG rate assessment: normal     Rhythm: sinus rhythm     Ectopy: none     Conduction: normal   Comments:     Patient placed on cardiac monitor to evaluate for arrhythmias    MEDICATIONS ORDERED IN ED: Medications - No data to display   IMPRESSION / MDM / ASSESSMENT AND PLAN / ED COURSE  I reviewed the triage vital signs and the nursing notes.  89 year old male presenting with AICD firing. Differential diagnosis includes, but is not limited to, ACS, aortic dissection, pulmonary embolism, cardiac tamponade, pneumothorax, pneumonia, pericarditis, myocarditis, GI-related causes including esophagitis/gastritis, and musculoskeletal chest wall pain.   I have personally reviewed patient's records and note a urology office visit on 04/03/2023 for urinary retention.  Patient's presentation is most consistent with acute presentation with potential threat to life or bodily function.  The patient is on the cardiac monitor to evaluate for evidence of arrhythmia and/or significant heart rate changes.  Will obtain cardiac panel, chest x-ray, check magnesium.  Will interrogate AICD.  Will reassess.      FINAL CLINICAL IMPRESSION(S) / ED DIAGNOSES   Final diagnoses:  AICD discharge     Rx / DC Orders   ED Discharge Orders     None        Note:  This document was prepared using Dragon voice recognition software and may include unintentional dictation errors.

## 2023-04-16 ENCOUNTER — Encounter: Payer: Self-pay | Admitting: Internal Medicine

## 2023-04-16 DIAGNOSIS — I472 Ventricular tachycardia, unspecified: Secondary | ICD-10-CM

## 2023-04-16 DIAGNOSIS — N400 Enlarged prostate without lower urinary tract symptoms: Secondary | ICD-10-CM | POA: Diagnosis present

## 2023-04-16 DIAGNOSIS — J9611 Chronic respiratory failure with hypoxia: Secondary | ICD-10-CM | POA: Diagnosis present

## 2023-04-16 DIAGNOSIS — E1165 Type 2 diabetes mellitus with hyperglycemia: Secondary | ICD-10-CM | POA: Diagnosis present

## 2023-04-16 DIAGNOSIS — Z7982 Long term (current) use of aspirin: Secondary | ICD-10-CM | POA: Diagnosis not present

## 2023-04-16 DIAGNOSIS — E785 Hyperlipidemia, unspecified: Secondary | ICD-10-CM | POA: Diagnosis present

## 2023-04-16 DIAGNOSIS — N1831 Chronic kidney disease, stage 3a: Secondary | ICD-10-CM | POA: Diagnosis present

## 2023-04-16 DIAGNOSIS — Z9981 Dependence on supplemental oxygen: Secondary | ICD-10-CM | POA: Diagnosis not present

## 2023-04-16 DIAGNOSIS — I6523 Occlusion and stenosis of bilateral carotid arteries: Secondary | ICD-10-CM | POA: Diagnosis present

## 2023-04-16 DIAGNOSIS — I259 Chronic ischemic heart disease, unspecified: Secondary | ICD-10-CM | POA: Diagnosis not present

## 2023-04-16 DIAGNOSIS — Z79899 Other long term (current) drug therapy: Secondary | ICD-10-CM | POA: Diagnosis not present

## 2023-04-16 DIAGNOSIS — I714 Abdominal aortic aneurysm, without rupture, unspecified: Secondary | ICD-10-CM | POA: Diagnosis present

## 2023-04-16 DIAGNOSIS — E1122 Type 2 diabetes mellitus with diabetic chronic kidney disease: Secondary | ICD-10-CM | POA: Diagnosis present

## 2023-04-16 DIAGNOSIS — G2581 Restless legs syndrome: Secondary | ICD-10-CM | POA: Diagnosis present

## 2023-04-16 DIAGNOSIS — Z96652 Presence of left artificial knee joint: Secondary | ICD-10-CM | POA: Diagnosis present

## 2023-04-16 DIAGNOSIS — Z4502 Encounter for adjustment and management of automatic implantable cardiac defibrillator: Principal | ICD-10-CM

## 2023-04-16 DIAGNOSIS — I5022 Chronic systolic (congestive) heart failure: Secondary | ICD-10-CM

## 2023-04-16 DIAGNOSIS — Z95810 Presence of automatic (implantable) cardiac defibrillator: Secondary | ICD-10-CM | POA: Diagnosis not present

## 2023-04-16 DIAGNOSIS — I252 Old myocardial infarction: Secondary | ICD-10-CM | POA: Diagnosis not present

## 2023-04-16 DIAGNOSIS — I480 Paroxysmal atrial fibrillation: Secondary | ICD-10-CM

## 2023-04-16 DIAGNOSIS — N179 Acute kidney failure, unspecified: Secondary | ICD-10-CM

## 2023-04-16 DIAGNOSIS — I9589 Other hypotension: Secondary | ICD-10-CM

## 2023-04-16 DIAGNOSIS — Z66 Do not resuscitate: Secondary | ICD-10-CM | POA: Diagnosis not present

## 2023-04-16 DIAGNOSIS — J849 Interstitial pulmonary disease, unspecified: Secondary | ICD-10-CM | POA: Diagnosis present

## 2023-04-16 DIAGNOSIS — E1140 Type 2 diabetes mellitus with diabetic neuropathy, unspecified: Secondary | ICD-10-CM | POA: Diagnosis present

## 2023-04-16 DIAGNOSIS — Z7984 Long term (current) use of oral hypoglycemic drugs: Secondary | ICD-10-CM | POA: Diagnosis not present

## 2023-04-16 DIAGNOSIS — I2510 Atherosclerotic heart disease of native coronary artery without angina pectoris: Secondary | ICD-10-CM | POA: Diagnosis present

## 2023-04-16 LAB — CBC
HCT: 41.4 % (ref 39.0–52.0)
Hemoglobin: 13 g/dL (ref 13.0–17.0)
MCH: 33.3 pg (ref 26.0–34.0)
MCHC: 31.4 g/dL (ref 30.0–36.0)
MCV: 106.2 fL — ABNORMAL HIGH (ref 80.0–100.0)
Platelets: 159 10*3/uL (ref 150–400)
RBC: 3.9 MIL/uL — ABNORMAL LOW (ref 4.22–5.81)
RDW: 12.7 % (ref 11.5–15.5)
WBC: 10 10*3/uL (ref 4.0–10.5)
nRBC: 0 % (ref 0.0–0.2)

## 2023-04-16 LAB — CBG MONITORING, ED
Glucose-Capillary: 214 mg/dL — ABNORMAL HIGH (ref 70–99)
Glucose-Capillary: 116 mg/dL — ABNORMAL HIGH (ref 70–99)
Glucose-Capillary: 61 mg/dL — ABNORMAL LOW (ref 70–99)
Glucose-Capillary: 216 mg/dL — ABNORMAL HIGH (ref 70–99)

## 2023-04-16 LAB — HEMOGLOBIN A1C
Hgb A1c MFr Bld: 8.7 % — ABNORMAL HIGH (ref 4.8–5.6)
Mean Plasma Glucose: 202.99 mg/dL

## 2023-04-16 LAB — TROPONIN I (HIGH SENSITIVITY): Troponin I (High Sensitivity): 201 ng/L (ref <18)

## 2023-04-16 LAB — GLUCOSE, CAPILLARY: Glucose-Capillary: 140 mg/dL — ABNORMAL HIGH (ref 70–99)

## 2023-04-16 MED ORDER — ACETAMINOPHEN 650 MG RE SUPP: 650.0000 mg | Freq: Four times a day (QID) | RECTAL | Status: DC | PRN

## 2023-04-16 MED ORDER — ENOXAPARIN SODIUM 40 MG/0.4ML IJ SOSY
40.0000 mg | PREFILLED_SYRINGE | INTRAMUSCULAR | Status: DC
  Administered 2023-04-16 – 2023-04-17 (×2): 40 mg via SUBCUTANEOUS
  Filled 2023-04-16 (×2): qty 0.4

## 2023-04-16 MED ORDER — AMIODARONE HCL 200 MG PO TABS
400.0000 mg | ORAL_TABLET | Freq: Two times a day (BID) | ORAL | Status: DC
  Administered 2023-04-16 – 2023-04-17 (×3): 400 mg via ORAL
  Filled 2023-04-16 (×3): qty 2

## 2023-04-16 MED ORDER — AMIODARONE HCL 200 MG PO TABS: 200.0000 mg | ORAL_TABLET | Freq: Every day | ORAL | Status: DC

## 2023-04-16 MED ORDER — AMIODARONE HCL 200 MG PO TABS: 400.0000 mg | ORAL_TABLET | Freq: Every day | ORAL | Status: DC

## 2023-04-16 MED ORDER — HYDROCODONE-ACETAMINOPHEN 5-325 MG PO TABS: 1.0000 | ORAL_TABLET | ORAL | Status: DC | PRN

## 2023-04-16 MED ORDER — SODIUM CHLORIDE 0.9 % IV BOLUS
500.0000 mL | Freq: Once | INTRAVENOUS | Status: AC
  Administered 2023-04-16: 500 mL via INTRAVENOUS

## 2023-04-16 MED ORDER — MELATONIN 5 MG PO TABS
5.0000 mg | ORAL_TABLET | Freq: Every day | ORAL | Status: DC
  Administered 2023-04-16 (×2): 5 mg via ORAL
  Filled 2023-04-16 (×2): qty 1

## 2023-04-16 MED ORDER — ASPIRIN 81 MG PO TBEC
81.0000 mg | DELAYED_RELEASE_TABLET | Freq: Every day | ORAL | Status: DC
  Administered 2023-04-16 – 2023-04-17 (×2): 81 mg via ORAL
  Filled 2023-04-16 (×2): qty 1

## 2023-04-16 MED ORDER — BRIMONIDINE TARTRATE 0.2 % OP SOLN
1.0000 [drp] | Freq: Two times a day (BID) | OPHTHALMIC | Status: DC
  Administered 2023-04-16 – 2023-04-17 (×3): 1 [drp] via OPHTHALMIC
  Filled 2023-04-16: qty 5

## 2023-04-16 MED ORDER — MIDODRINE HCL 5 MG PO TABS
5.0000 mg | ORAL_TABLET | Freq: Three times a day (TID) | ORAL | Status: DC
  Administered 2023-04-16 – 2023-04-17 (×4): 5 mg via ORAL
  Filled 2023-04-16 (×4): qty 1

## 2023-04-16 MED ORDER — INSULIN ASPART 100 UNIT/ML IJ SOLN
0.0000 [IU] | Freq: Every day | INTRAMUSCULAR | Status: DC
  Administered 2023-04-16: 2 [IU] via SUBCUTANEOUS
  Filled 2023-04-16: qty 1

## 2023-04-16 MED ORDER — ACETAMINOPHEN 325 MG PO TABS: 650.0000 mg | ORAL_TABLET | Freq: Four times a day (QID) | ORAL | Status: DC | PRN

## 2023-04-16 MED ORDER — ALBUTEROL SULFATE (2.5 MG/3ML) 0.083% IN NEBU: 2.5000 mg | INHALATION_SOLUTION | Freq: Four times a day (QID) | RESPIRATORY_TRACT | Status: DC | PRN

## 2023-04-16 MED ORDER — ONDANSETRON HCL 4 MG PO TABS: 4.0000 mg | ORAL_TABLET | Freq: Four times a day (QID) | ORAL | Status: DC | PRN

## 2023-04-16 MED ORDER — ONDANSETRON HCL 4 MG/2ML IJ SOLN: 4.0000 mg | Freq: Four times a day (QID) | INTRAMUSCULAR | Status: DC | PRN

## 2023-04-16 MED ORDER — GABAPENTIN 300 MG PO CAPS
300.0000 mg | ORAL_CAPSULE | Freq: Two times a day (BID) | ORAL | Status: DC
  Administered 2023-04-16 – 2023-04-17 (×3): 300 mg via ORAL
  Filled 2023-04-16 (×3): qty 1

## 2023-04-16 MED ORDER — TAMSULOSIN HCL 0.4 MG PO CAPS
0.4000 mg | ORAL_CAPSULE | Freq: Every day | ORAL | Status: DC
  Administered 2023-04-16: 0.4 mg via ORAL
  Filled 2023-04-16: qty 1

## 2023-04-16 MED ORDER — METOPROLOL SUCCINATE ER 25 MG PO TB24
12.5000 mg | ORAL_TABLET | Freq: Every day | ORAL | Status: DC
  Administered 2023-04-16 – 2023-04-17 (×2): 12.5 mg via ORAL
  Filled 2023-04-16 (×2): qty 1

## 2023-04-16 MED ORDER — INSULIN ASPART 100 UNIT/ML IJ SOLN
0.0000 [IU] | Freq: Three times a day (TID) | INTRAMUSCULAR | Status: DC
  Administered 2023-04-16: 3 [IU] via SUBCUTANEOUS
  Administered 2023-04-17: 2 [IU] via SUBCUTANEOUS
  Filled 2023-04-16 (×2): qty 1

## 2023-04-16 NOTE — Assessment & Plan Note (Deleted)
Coronary artery disease Suspect secondary to AICD discharge Patient denies chest pain Continue to trend Will hold off on heparin for now Cardiology consult

## 2023-04-16 NOTE — Assessment & Plan Note (Signed)
Clinically euvolemic to dry Holding furosemide, Entresto tonight due to soft blood pressure and AKI to resume as appropriate Continue metoprolol

## 2023-04-16 NOTE — H&P (Signed)
History and Physical    Patient: [name] ZOX:[id number] DOB: [birth year] DOA: 04/15/2023 DOS: the patient was seen and examined on 04/16/2023 PCP: Myrene Buddy, NP  Patient coming from: Home  Chief Complaint:  Chief Complaint  Patient presents with   AICD Problem    HPI: [name] is a 89 y.o. male with medical history significant for Chronic systolic HF s/p AICD, Atrial fibrillation not on anticoagulation, CAD with stable angina, ventricular tachycardia on amiodarone, bilateral carotid stenosis, AAA, Type 2 DM, chronic hypotension on midodrine, who presents to the ED because of an AICD discharge as he was walking back to his recliner after using the bathroom   He denies previously feeling unwell.  And felt like he was in his usual state of health.  Had no preceding palpitations, chest pain, shortness of breath, lightheadedness.  Has no recent or current respiratory or GI symptoms.  While he was sitting in his recliner.  Patient was last seen by his cardiologist on 7/25.  He was doing well and given a 36-month follow-up. ED course and data review: Soft BP of 91/63 in the ED and mild tachypnea to 27, otherwise unremarkable.  Heart rate 89. Labs: First troponin 201. CBC unremarkable CMP notable For creatinine of 1.7 up from his baseline of 1.03, and elevated blood glucose of 264.  Potassium normal at 4.3 and magnesium at 2.2. EKG, pending. Chest x-ray showing pulmonary vascular congestion as follows: IMPRESSION: 1. Cardiomegaly with central pulmonary vascular congestion. 2. Unchanged right perihilar nodular density measuring 13 mm. 3. Stable chronic interstitial lung disease.  Patient ordered a 500 cc NS bolus Hospitalist consulted for admission.   Review of Systems: As mentioned in the history of present illness. All other systems reviewed and are negative.  Past Medical History:  Diagnosis Date   CAD (coronary artery disease)    Cardiac defibrillator in  place 01/2020   CHF (congestive heart failure) (HCC)    Diabetes mellitus without complication (HCC)    History of myocardial infarction 01/2020   Peripheral neuropathy    Restless leg syndrome    Past Surgical History:  Procedure Laterality Date   APPENDECTOMY     CARDIAC DEFIBRILLATOR PLACEMENT Left 01/2020   PARTIAL KNEE ARTHROPLASTY Left    Social History:  reports that he has never smoked. He has never used smokeless tobacco. He reports that he does not currently use alcohol. He reports that he does not currently use drugs.  Allergies  Allergen Reactions   Sulfamethoxazole-Trimethoprim Other (See Comments)    History reviewed. No pertinent family history.  Prior to Admission medications   Medication Sig Start Date End Date Taking? Authorizing Provider  acetaminophen (TYLENOL) 500 MG tablet Take 1 tablet (500 mg total) by mouth every 6 (six) hours as needed. 08/24/20   Lurene Shadow, MD  albuterol (VENTOLIN HFA) 108 (90 Base) MCG/ACT inhaler Inhale 2 puffs into the lungs every 6 (six) hours as needed for wheezing or shortness of breath. Use 2 times daily x5 days, then every 6 hours as needed. 10/27/20   Rodolph Bong, MD  amiodarone (PACERONE) 200 MG tablet Take 200 mg by mouth daily. 04/10/22   [provider]  aspirin 81 MG EC tablet Take 81 mg by mouth daily.    [provider]  brimonidine (ALPHAGAN P) 0.1 % SOLN Place 1 drop into the left eye 2 (two) times daily.    [provider]  dorzolamide (TRUSOPT) 2 % ophthalmic solution Place  1 drop into both eyes 2 (two) times daily.    [provider]  ENTRESTO 24-26 MG SMARTSIG:1 Tablet(s) By Mouth Every 12 Hours 11/01/20   Rodolph Bong, MD  furosemide (LASIX) 40 MG tablet Take 1 tablet (40 mg total) by mouth daily. 11/01/20   Rodolph Bong, MD  gabapentin (NEURONTIN) 300 MG capsule Take 300 mg by mouth 2 (two) times daily. 07/06/20   [provider]  guaiFENesin (MUCINEX) 600  MG 12 hr tablet Take 600 mg by mouth 2 (two) times daily.    [provider]  latanoprost (XALATAN) 0.005 % ophthalmic solution Place 1 drop into both eyes at bedtime.    [provider]  metFORMIN (GLUCOPHAGE) 500 MG tablet Take 500 mg by mouth daily with breakfast.    [provider]  metoprolol succinate (TOPROL-XL) 25 MG 24 hr tablet Take 12.5 mg by mouth daily. 08/11/20   [provider]  midodrine (PROAMATINE) 5 MG tablet Take 1 tablet (5 mg total) by mouth 3 (three) times daily with meals. 10/27/20   Rodolph Bong, MD  Multiple Vitamin (MULTI-VITAMIN) tablet Take 1 tablet by mouth daily.    [provider]  tamsulosin (FLOMAX) 0.4 MG CAPS capsule Take 0.4 mg by mouth daily.    [provider]    Physical Exam: Vitals:   04/15/23 2307  BP: 91/63  Pulse: 89  Resp: (!) 27  Temp: 97.8 F (36.6 C)  SpO2: 99%  Weight: 71.7 kg  Height: 5\' 11"  (1.803 m)   Physical Exam Vitals and nursing note reviewed.  Constitutional:      General: He is not in acute distress. HENT:     Head: Normocephalic and atraumatic.  Cardiovascular:     Rate and Rhythm: Normal rate and regular rhythm.     Heart sounds: Normal heart sounds.  Pulmonary:     Effort: Pulmonary effort is normal.     Breath sounds: Normal breath sounds.  Abdominal:     Palpations: Abdomen is soft.     Tenderness: There is no abdominal tenderness.  Neurological:     Mental Status: Mental status is at baseline.     Labs on Admission: I have personally reviewed following labs and imaging studies  CBC: Recent Labs  Lab 04/15/23 2317  WBC 9.5  NEUTROABS 5.5  HGB 13.3  HCT 42.0  MCV 107.1*  PLT 164   Basic Metabolic Panel: Recent Labs  Lab 04/15/23 2317  NA 137  K 4.3  CL 101  CO2 25  GLUCOSE 264*  BUN 42*  CREATININE 1.47*  CALCIUM 8.7*  MG 2.2   GFR: Estimated Creatinine Clearance: 32.5 mL/min (A) (by C-G formula based on SCr of 1.47 mg/dL  (H)). Liver Function Tests: Recent Labs  Lab 04/15/23 2317  AST 24  ALT 10  ALKPHOS 72  BILITOT 0.7  PROT 7.9  ALBUMIN 3.6   No results for input(s): "LIPASE", "AMYLASE" in the last 168 hours. No results for input(s): "AMMONIA" in the last 168 hours. Coagulation Profile: No results for input(s): "INR", "PROTIME" in the last 168 hours. Cardiac Enzymes: No results for input(s): "CKTOTAL", "CKMB", "CKMBINDEX", "TROPONINI" in the last 168 hours. BNP (last 3 results) No results for input(s): "PROBNP" in the last 8760 hours. HbA1C: No results for input(s): "HGBA1C" in the last 72 hours. CBG: No results for input(s): "GLUCAP" in the last 168 hours. Lipid Profile: No results for input(s): "CHOL", "HDL", "LDLCALC", "TRIG", "CHOLHDL", "LDLDIRECT" in the  last 72 hours. Thyroid Function Tests: No results for input(s): "TSH", "T4TOTAL", "FREET4", "T3FREE", "THYROIDAB" in the last 72 hours. Anemia Panel: No results for input(s): "VITAMINB12", "FOLATE", "FERRITIN", "TIBC", "IRON", "RETICCTPCT" in the last 72 hours. Urine analysis:    Component Value Date/Time   COLORURINE YELLOW 02/24/2023 1135   APPEARANCEUR CLOUDY (A) 02/24/2023 1135   LABSPEC 1.020 02/24/2023 1135   PHURINE 7.0 02/24/2023 1135   GLUCOSEU NEGATIVE 02/24/2023 1135   HGBUR MODERATE (A) 02/24/2023 1135   BILIRUBINUR NEGATIVE 02/24/2023 1135   KETONESUR NEGATIVE 02/24/2023 1135   PROTEINUR 100 (A) 02/24/2023 1135   NITRITE POSITIVE (A) 02/24/2023 1135   LEUKOCYTESUR LARGE (A) 02/24/2023 1135    Radiological Exams on Admission: DG Chest Port 1 View  Result Date: 04/16/2023 CLINICAL DATA:  Defibrillator shock EXAM: PORTABLE CHEST 1 VIEW COMPARISON:  Chest x-ray 04/24/2022 FINDINGS: The heart is enlarged, unchanged. Left-sided pacemaker is again seen. There central pulmonary vascular congestion. Right perihilar nodular density measuring 13 mm again noted. There scattered multifocal areas of interstitial opacities in the  mid and lower lungs similar to the prior study. There is no new focal lung infiltrate, pleural effusion or pneumothorax. No acute fractures are seen. IMPRESSION: 1. Cardiomegaly with central pulmonary vascular congestion. 2. Unchanged right perihilar nodular density measuring 13 mm. 3. Stable chronic interstitial lung disease. Electronically Signed   By: Darliss Cheney M.D.   On: 04/16/2023 00:00     Data Reviewed: Relevant notes from primary care and specialist visits, past discharge summaries as available in EHR, including Care Everywhere. Prior diagnostic testing as pertinent to current admission diagnoses Updated medications and problem lists for reconciliation ED course, including vitals, labs, imaging, treatment and response to treatment Triage notes, nursing and pharmacy notes and ED provider's notes Notable results as noted in HPI   Assessment and Plan: * AICD discharge Ventricular tachycardia s/p AICD, on amiodarone Patient asymptomatic prior to and following discharge Magnesium and potassium optimal Continuous cardiac monitoring overnight If repeated discharges might load with IV amiodarone Continue to monitor Cardiology consult Will interrogate AICD  Elevated troponin in the setting of AICD discharge Coronary artery disease Suspect secondary to AICD discharge Patient denies chest pain Continue to trend Will hold off on heparin for now Cardiology consult  AKI (acute kidney injury) (HCC) Creatinine 1.7 up from baseline of 1.03 Possibly related to hypotension, BP soft at 91/63 Getting a fluid bolus in the ED Continue home midodrine Monitor renal function and avoid nephrotoxins Will hold home Lasix, Entresto and gabapentin tonight  Chronic hypotension Possibly medication related Continue midodrine  Chronic systolic CHF (congestive heart failure), NYHA class 3 (HCC) Clinically euvolemic to dry Holding furosemide, Entresto tonight due to soft blood pressure and AKI to  resume as appropriate Continue metoprolol  Uncontrolled type 2 diabetes mellitus with hyperglycemia, without long-term current use of insulin (HCC) Blood glucose 264 Sliding scale insulin coverage Continue gabapentin for neuropathy  Atrial fibrillation (HCC) Patient is not on anticoagulation for unclear reason Continue aspirin and metoprolol  AAA (abdominal aortic aneurysm) (HCC) Carotid artery stenosis Continue aspirin Not currently on statin per med review  BPH (benign prostatic hyperplasia) Continue tamsulosin    DVT prophylaxis: Lovenox  Consults: Dr Darrold Junker  Advance Care Planning:   Code Status: Prior   Family Communication: none  Disposition Plan: Back to previous home environment  Severity of Illness: The appropriate patient status for this patient is OBSERVATION. Observation status is judged to be reasonable and necessary in order to provide  the required intensity of service to ensure the patient's safety. The patient's presenting symptoms, physical exam findings, and initial radiographic and laboratory data in the context of their medical condition is felt to place them at decreased risk for further clinical deterioration. Furthermore, it is anticipated that the patient will be medically stable for discharge from the hospital within 2 midnights of admission.   Author: Andris Baumann, MD 04/16/2023 12:28 AM  For on call review [email or web address].

## 2023-04-16 NOTE — Assessment & Plan Note (Addendum)
Ventricular tachycardia on interrogation of ICD

## 2023-04-16 NOTE — Hospital Course (Signed)
89 year old man past medical history of chronic systolic congestive heart failure, defibrillator placement, atrial fibrillation, CAD, ventricular tachycardia, carotid stenosis, AAA, type 2 diabetes mellitus, chronic hypotension.  Patient was walking to get back to his chair and his ICD fired.  Patient came to the emergency room.  ICD was interrogated showed ventricular tachycardia.  10/14.  Cardiology will amiodarone load orally.  Will get physical therapy to evaluate. 10/15.  Patient feeling okay offers no complaints.  Patient interested in going home.

## 2023-04-16 NOTE — Evaluation (Signed)
Occupational Therapy Evaluation Patient Details Name: [name] MRN: [id number] DOB: 25-May-1929 Today's Date: 04/16/2023   History of Present Illness 89 year old man past medical history of chronic systolic congestive heart failure, defibrillator placement, atrial fibrillation, CAD, ventricular tachycardia, carotid stenosis, AAA, type 2 diabetes mellitus, chronic hypotension.  Patient was walking to get back to his chair and his ICD fired.  Patient came to the emergency room.  ICD was interrogated showed ventricular tachycardia.   Clinical Impression   Pt was seen for OT evaluation this date. Prior to hospital admission, pt was independent/Mod I with ADL's. Pt reports son and daughter-in-law help with IADL's . Pt lives with wife. Pt presents to acute OT demonstrating impaired ADL performance and functional mobility 2/2 (See OT problem list for additional functional deficits). Upon entering room, pt supine in bed, agreeable to tx. Pt completed supine>sit t/f with supervision. Pt donned shoes seated at the EOB with supervision and set up. Pt completed a sit>stand t/f with supervision +RW. Pt ambulated to bathroom in the hallway with CGA +RW. Pt completed a toilet t/f with supervision and handoff to PT in bathroom. No patient distress noted. Pt would benefit from skilled OT services to address noted impairments and functional limitations (see below for any additional details) in order to maximize safety and independence while minimizing falls risk and caregiver burden. Anticipate the need for follow up OT services upon acute hospital DC.       If plan is discharge home, recommend the following: A little help with walking and/or transfers;A little help with bathing/dressing/bathroom;Assistance with cooking/housework;Assist for transportation    Functional Status Assessment  Patient has had a recent decline in their functional status and demonstrates the ability to make significant improvements  in function in a reasonable and predictable amount of time.  Equipment Recommendations  None recommended by OT    Recommendations for Other Services       Precautions / Restrictions Precautions Precautions: None Restrictions Weight Bearing Restrictions: No      Mobility Bed Mobility Overal bed mobility: Needs Assistance Bed Mobility: Supine to Sit     Supine to sit: Supervision          Transfers Overall transfer level: Needs assistance Equipment used: Rolling walker (2 wheels) Transfers: Sit to/from Stand Sit to Stand: Supervision                  Balance Overall balance assessment: No apparent balance deficits (not formally assessed)                                         ADL either performed or assessed with clinical judgement   ADL Overall ADL's : Needs assistance/impaired                     Lower Body Dressing: Supervision/safety;Set up;Sitting/lateral leans   Toilet Transfer: Supervision/safety;Regular Toilet;Rolling walker (2 wheels)           Functional mobility during ADLs: Contact guard assist;Supervision/safety;Set up;Rolling walker (2 wheels)       Vision         Perception         Praxis         Pertinent Vitals/Pain Pain Assessment Pain Assessment: No/denies pain     Extremity/Trunk Assessment Upper Extremity Assessment Upper Extremity Assessment: Overall WFL for tasks assessed   Lower Extremity Assessment Lower  Extremity Assessment: Defer to PT evaluation       Communication Communication Communication: No apparent difficulties   Cognition Arousal: Alert Behavior During Therapy: WFL for tasks assessed/performed Overall Cognitive Status: Within Functional Limits for tasks assessed                                       General Comments       Exercises     Shoulder Instructions      Home Living Family/patient expects to be discharged to:: Private  residence Living Arrangements: Spouse/significant other Available Help at Discharge: Family Type of Home: House Home Access: Ramped entrance     Home Layout: One level     Bathroom Shower/Tub: Arts development officer Toilet: Handicapped height     Home Equipment: Agricultural consultant (2 wheels);Wheelchair - power;Shower seat;Grab bars - toilet;Grab bars - tub/shower          Prior Functioning/Environment Prior Level of Function : Independent/Modified Independent             Mobility Comments: pt does not drive, son or daughter-in-law help with IADL's ADLs Comments: Independent/Mod I with all ADL's. Pt reports using walker in home and walker to get to WC.        OT Problem List: Decreased strength;Decreased coordination;Decreased range of motion;Decreased activity tolerance;Decreased safety awareness      OT Treatment/Interventions: Self-care/ADL training;Therapeutic exercise;Therapeutic activities;Energy conservation;DME and/or AE instruction    OT Goals(Current goals can be found in the care plan section) Acute Rehab OT Goals Patient Stated Goal: to go home. OT Goal Formulation: With patient Time For Goal Achievement: 04/30/23 Potential to Achieve Goals: Good  OT Frequency: Min 1X/week    Co-evaluation              AM-PAC OT "6 Clicks" Daily Activity     Outcome Measure Help from another person eating meals?: None Help from another person taking care of personal grooming?: A Little Help from another person toileting, which includes using toliet, bedpan, or urinal?: A Little Help from another person bathing (including washing, rinsing, drying)?: A Lot Help from another person to put on and taking off regular upper body clothing?: A Little Help from another person to put on and taking off regular lower body clothing?: A Lot 6 Click Score: 17   End of Session Equipment Utilized During Treatment: Rolling walker (2 wheels) Nurse Communication: Mobility  status  Activity Tolerance: Patient tolerated treatment well Patient left: Other (comment) (handoff to PT)  OT Visit Diagnosis: Unsteadiness on feet (R26.81);History of falling (Z91.81);Other abnormalities of gait and mobility (R26.89);Muscle weakness (generalized) (M62.81)                Time: 1432-1450 OT Time Calculation (min): 18 min Charges:  OT General Charges $OT Visit: 1 Visit OT Evaluation $OT Eval Moderate Complexity: 1 Mod OT Treatments $Self Care/Home Management : 8-22 mins  Butch Penny, SOT

## 2023-04-16 NOTE — Progress Notes (Signed)
Physical Therapy Evaluation Patient Details Name: LAURO TENORIO MRN: [id number] DOB: May 11, 1929 Today's Date: 04/16/2023  History of Present Illness  89 year old man past medical history of chronic systolic congestive heart failure, defibrillator placement, atrial fibrillation, CAD, ventricular tachycardia, carotid stenosis, AAA, type 2 diabetes mellitus, chronic hypotension.  Patient was walking to get back to his chair and his ICD fired.  Patient came to the emergency room.  ICD was interrogated showed ventricular tachycardia.   Clinical Impression  Pt is 89 y.o. in for ventricular tachycardia. Pt was received in the bathroom and agreed to PT session. During the session, pt was on 6L of O2. While seated on toilet, pt demonstrated physical fatigue as well as desat to low 80s. With the use of RW (2wheels) pt performed STS and post-bathroom dressing CGA. Pt amb back to bedroom from bathroom with RW CGA. Pt desat the lowest 78 while amb where verbal cueing necessary throughout session to reinforce pursed lip breathing technique. Pt sat at EOB CGA and performed bed mobility supervision to end the session. Pt tolerated tx fair today and will continue to benefit from skilled PT sessions following d/c to improve activity tolerance, increase endurance, and improve functional mobility.         If plan is discharge home, recommend the following: A little help with walking and/or transfers;Assist for transportation;Help with stairs or ramp for entrance   Can travel by private vehicle        Equipment Recommendations    Recommendations for Other Services       Functional Status Assessment Patient has had a recent decline in their functional status and demonstrates the ability to make significant improvements in function in a reasonable and predictable amount of time.     Precautions / Restrictions Precautions Precautions: None Restrictions Weight Bearing Restrictions: No      Mobility  Bed  Mobility Overal bed mobility: Needs Assistance Bed Mobility: Sit to Supine       Sit to supine: Supervision   General bed mobility comments: Pt performed bed mobility with supervision.    Transfers Overall transfer level: Needs assistance Equipment used: Rolling walker (2 wheels) Transfers: Sit to/from Stand Sit to Stand: Contact guard assist           General transfer comment: Pt performed STS from toilet CGA while post-bathroom dressing.    Ambulation/Gait Ambulation/Gait assistance: Contact guard assist Gait Distance (Feet): 20 Feet Assistive device: Rolling walker (2 wheels) Gait Pattern/deviations: Step-through pattern Gait velocity: decreased     General Gait Details: Pt amb from bathroom to bedroom CGA. Pt desat to as low as 78 when amb. Verbal cues necessary for pursed lip breathing technique throughout session.  Stairs            Wheelchair Mobility     Tilt Bed    Modified Rankin (Stroke Patients Only)       Balance Overall balance assessment: No apparent balance deficits (not formally assessed)                                           Pertinent Vitals/Pain Pain Assessment Pain Assessment: No/denies pain    Home Living Family/patient expects to be discharged to:: Private residence Living Arrangements: Spouse/significant other Available Help at Discharge: Family Type of Home: House Home Access: Ramped entrance       Home Layout: One level Home Equipment:  Rolling Walker (2 wheels);Wheelchair - power;Shower seat;Grab bars - toilet;Grab bars - tub/shower      Prior Function Prior Level of Function : Independent/Modified Independent             Mobility Comments: pt does not drive, son or daughter-in-law help with IADL's ADLs Comments: Independent/Mod I with all ADL's. Pt reports using walker in home and walker to get to WC. WC is used for prolongded mobility.     Extremity/Trunk Assessment   Upper Extremity  Assessment Upper Extremity Assessment: Overall WFL for tasks assessed    Lower Extremity Assessment Lower Extremity Assessment: Overall WFL for tasks assessed       Communication   Communication Communication: No apparent difficulties Cueing Techniques: Verbal cues  Cognition Arousal: Alert Behavior During Therapy: WFL for tasks assessed/performed Overall Cognitive Status: Within Functional Limits for tasks assessed                                 General Comments: AOx4. Pt pleasant and willing to participate in session.        General Comments General comments (skin integrity, edema, etc.): Pt had a skin tear on Left forearm. Nurse informed.    Exercises     Assessment/Plan    PT Assessment Patient needs continued PT services  PT Problem List Decreased activity tolerance;Decreased mobility       PT Treatment Interventions Gait training;DME instruction;Functional mobility training;Therapeutic activities    PT Goals (Current goals can be found in the Care Plan section)  Acute Rehab PT Goals Patient Stated Goal: To go home PT Goal Formulation: With patient Time For Goal Achievement: 04/30/23 Potential to Achieve Goals: Good    Frequency Min 1X/week     Co-evaluation               AM-PAC PT "6 Clicks" Mobility  Outcome Measure Help needed turning from your back to your side while in a flat bed without using bedrails?: None Help needed moving from lying on your back to sitting on the side of a flat bed without using bedrails?: None Help needed moving to and from a bed to a chair (including a wheelchair)?: A Little Help needed standing up from a chair using your arms (e.g., wheelchair or bedside chair)?: A Little Help needed to walk in hospital room?: A Little Help needed climbing 3-5 steps with a railing? : A Lot 6 Click Score: 19    End of Session   Activity Tolerance: Patient tolerated treatment well Patient left: in bed;with call  bell/phone within reach;with bed alarm set Nurse Communication: Mobility status PT Visit Diagnosis: Other abnormalities of gait and mobility (R26.89);History of falling (Z91.81)    Time: 1450-1506 PT Time Calculation (min) (ACUTE ONLY): 16 min   Charges:   PT Evaluation $PT Eval Low Complexity: 1 Low PT Treatments $Gait Training: 8-22 mins PT General Charges $$ ACUTE PT VISIT: 1 Visit         Tashon Capp [place] Howard SPT, LAT, ATC   Maci Eickholt Sau[place]Howard 04/16/2023, 5:10 PM

## 2023-04-16 NOTE — Assessment & Plan Note (Addendum)
Not on major anticoagulation Continue aspirin and metoprolol

## 2023-04-16 NOTE — Assessment & Plan Note (Signed)
Carotid artery stenosis Continue aspirin Not currently on statin per med review

## 2023-04-16 NOTE — Assessment & Plan Note (Signed)
Likely from ICD firing.

## 2023-04-16 NOTE — Consult Note (Signed)
[facility] CLIN[facility] CARDIOLOGY CONSULT NOTE       Patient ID: [name] MRN: [id number] DOB/AGE: 01-03-1929 89 y.o.  Admit date: 04/15/2023 Referring Physician Dr. Lindajo Royal Primary Physician Lenon Oms, NP Primary Cardiologist Dr. Dorothyann Peng Reason for Consultation ventricular tachycardia/AICD discharge  HPI: [name] is a 89 y.o. male  with a past medical history of coronary artery disease, atrial fibrillation not on anticoagulation, chronic systolic CHF, ventricular tachycardia, s/p AICD implant 01/2020, hyperlipidemia, type 2 diabetes, bilateral carotid artery stenosis, AAA who presented to the ED on 04/15/2023 for AICD discharge. Cardiology was consulted for further evaluation.   Patient reports he was in his usual state of health until late yesterday evening.  He got up and walked to the bathroom and as he was walking back in about this in his recliner he experienced sudden onset shortness of breath and within a few seconds was shocked by his ICD.  He came to the ED for evaluation after this.  Workup in the ED notable for creatinine 1.47, potassium 4.3, magnesium 2.2, hemoglobin 13.2.  Troponins mildly elevated and flat, 201 > 201.  EKG in the ED demonstrated normal sinus rhythm with a rate of 67 bpm, nonacute.  Chest x-ray with pulmonary vascular congestion.  He was mildly hypotensive in the ED and was given a fluid bolus.  He denies any symptoms of chest pain, palpitations, dizziness or lightheadedness, syncope prior to his device firing.  He reports that over the last week he has felt more fatigued than usual.  He denies any other recent episodes of AICD discharging, states that he was shocked once a few years ago.  He had previously been on amiodarone which was started after his initial episodes of VT/VF with subsequent ICD discharge in 2022 but it appears that he has not been taking this recently.  States that overall he has been doing well with no significant  concerns.  Review of systems complete and found to be negative unless listed above    Past Medical History:  Diagnosis Date   CAD (coronary artery disease)    Cardiac defibrillator in place 01/2020   CHF (congestive heart failure) (HCC)    Diabetes mellitus without complication (HCC)    History of myocardial infarction 01/2020   Peripheral neuropathy    Restless leg syndrome     Past Surgical History:  Procedure Laterality Date   APPENDECTOMY     CARDIAC DEFIBRILLATOR PLACEMENT Left 01/2020   PARTIAL KNEE ARTHROPLASTY Left     (Not in a hospital admission)  Social History   Socioeconomic History   Marital status: Married    Spouse name: Not on file   Number of children: Not on file   Years of education: Not on file   Highest education level: Not on file  Occupational History   Not on file  Tobacco Use   Smoking status: Never   Smokeless tobacco: Never  Vaping Use   Vaping status: Never Used  Substance and Sexual Activity   Alcohol use: Not Currently   Drug use: Not Currently   Sexual activity: Not on file  Other Topics Concern   Not on file  Social History Narrative   Not on file   Social Determinants of Health   Financial Resource Strain: Low Risk  (10/05/2022)   Received from [facility], [facility]   Overall Financial Resource Strain (CARDIA)    Difficulty of Paying Living Expenses: Not hard at all  Food  Insecurity: No Food Insecurity (10/05/2022)   Received from [facility], [facility]   Hunger Vital Sign    Worried About Running Out of Food in the Last Year: Never true    Ran Out of Food in the Last Year: Never true  Transportation Needs: No Transportation Needs (10/05/2022)   Received from [facility], [facility]   [facility] - Transportation    Lack of Transportation (Medical): No    Lack of Transportation (Non-Medical): No  Physical Activity: Not on file  Stress: Not on file  Social Connections: Not on file  Intimate Partner  Violence: Not on file    History reviewed. No pertinent family history.   Vitals:   04/16/23 0820 04/16/23 0824 04/16/23 0900 04/16/23 1000  BP: 127/76  113/69 (!) 105/90  Pulse: (!) 104  (!) 59 64  Resp: 19  15 (!) 23  Temp:  97.9 F (36.6 C)    TempSrc:  Oral    SpO2: 100%  100% 100%  Weight:      Height:        PHYSICAL EXAM General: Chronically ill-appearing, well nourished, in no acute distress. HEENT: Normocephalic and atraumatic. Neck: No JVD.  Lungs: Normal respiratory effort on 5L [place]. Clear bilaterally to auscultation. No wheezes, crackles, rhonchi.  Heart: HRRR. Normal S1 and S2 without gallops or murmurs.  Abdomen: Non-distended appearing.  Msk: Normal strength and tone for age. Extremities: Warm and well perfused. No clubbing, cyanosis.  No edema.  Neuro: Alert and oriented X 3. Psych: Answers questions appropriately.   Labs: Basic Metabolic Panel: Recent Labs    04/15/23 2317  NA 137  K 4.3  CL 101  CO2 25  GLUCOSE 264*  BUN 42*  CREATININE 1.47*  CALCIUM 8.7*  MG 2.2   Liver Function Tests: Recent Labs    04/15/23 2317  AST 24  ALT 10  ALKPHOS 72  BILITOT 0.7  PROT 7.9  ALBUMIN 3.6   No results for input(s): "LIPASE", "AMYLASE" in the last 72 hours. CBC: Recent Labs    04/15/23 2317 04/16/23 0735  WBC 9.5 10.0  NEUTROABS 5.5  --   HGB 13.3 13.0  HCT 42.0 41.4  MCV 107.1* 106.2*  PLT 164 159   Cardiac Enzymes: Recent Labs    04/15/23 2317 04/16/23 0109  TROPONINIHS 201* 201*   BNP: No results for input(s): "BNP" in the last 72 hours. D-Dimer: No results for input(s): "DDIMER" in the last 72 hours. Hemoglobin A1C: Recent Labs    04/16/23 0220  HGBA1C 8.7*   Fasting Lipid Panel: No results for input(s): "CHOL", "HDL", "LDLCALC", "TRIG", "CHOLHDL", "LDLDIRECT" in the last 72 hours. Thyroid Function Tests: No results for input(s): "TSH", "T4TOTAL", "T3FREE", "THYROIDAB" in the last 72 hours.  Invalid input(s):  "FREET3" Anemia Panel: No results for input(s): "VITAMINB12", "FOLATE", "FERRITIN", "TIBC", "IRON", "RETICCTPCT" in the last 72 hours.   Radiology: [facility] Chest Port 1 View  Result Date: 04/16/2023 CLINICAL DATA:  Defibrillator shock EXAM: PORTABLE CHEST 1 VIEW COMPARISON:  Chest x-ray 04/24/2022 FINDINGS: The heart is enlarged, unchanged. Left-sided pacemaker is again seen. There central pulmonary vascular congestion. Right perihilar nodular density measuring 13 mm again noted. There scattered multifocal areas of interstitial opacities in the mid and lower lungs similar to the prior study. There is no new focal lung infiltrate, pleural effusion or pneumothorax. No acute fractures are seen. IMPRESSION: 1. Cardiomegaly with central pulmonary vascular congestion. 2. Unchanged right perihilar nodular density  measuring 13 mm. 3. Stable chronic interstitial lung disease. Electronically Signed   By: Darliss Cheney M.D.   On: 04/16/2023 00:00    ECHO 10/2022: 1. The left ventricle is mildly dilated in size with normal wall thickness.    2. The left ventricular systolic function is severely decreased, LVEF is  visually estimated at 30%.    3. There is thinning and akinesis of the anteroapical and apical segments.    4. Mitral annular calcification is present (mild).    5. The mitral valve leaflets are mildly thickened with normal leaflet  mobility.   6. The aortic valve is probably trileaflet with mildly thickened leaflets  with mildly reduced excursion.    7. There is mild aortic regurgitation.    8. The right ventricle is normal in size, with normal systolic function.   TELEMETRY reviewed by me 04/16/2023: Sinus rhythm with PACs and PVCs rate 60s  EKG reviewed by me: NSR 1st degree AVB rate 67 bpm  Data reviewed by me 04/16/2023: last 24h vitals tele labs imaging I/O ED provider note, admission H&P  Principal Problem:   AICD discharge Active Problems:   Elevated troponin in the setting of AICD  discharge   Chronic systolic CHF (congestive heart failure), NYHA class 3 (HCC)   Coronary artery disease involving native coronary artery of native heart   Atrial fibrillation (HCC)   BPH (benign prostatic hyperplasia)   Carotid stenosis   AKI (acute kidney injury) (HCC)   Ventricular tachycardia, on amiodarone (HCC)   Chronic hypotension   Uncontrolled type 2 diabetes mellitus with hyperglycemia, without long-term current use of insulin (HCC)   AAA (abdominal aortic aneurysm) (HCC)    ASSESSMENT AND PLAN:  GEORDAN BRASHERS is a 89 y.o. male  with a past medical history of coronary artery disease, atrial fibrillation, chronic systolic CHF, ventricular tachycardia, AICD in place, hyperlipidemia, type 2 diabetes, bilateral carotid artery stenosis, AAA  who presented to the ED on 04/15/2023 for AICD discharge. Cardiology was consulted for further evaluation.   # AICD discharge # Ventricular tachycardia # Elevated troponin Patient with ICD discharge x1 last night, apparent VT on device interrogation. No recurrence in the ED.  -Amiodarone 400 mg bid for 10 days, then taper.  -Troponin elevation most consistent with demand/supply mismatch and not ACS in the setting of AICD discharge.  # Chronic HFrEF # Hypotension With most recent EF 30% on echo 10/2022. Has been tolerating GDMT well. -S/p gentle IV hydration -Home furosemide, entresto currently held given BP and renal function.   # Paroxysmal atrial fibrillation Patient with hx of pAF, on metoprolol for rate control at home. Historically not anticoagulated given hx of bleeding and age. -Continue metoprolol succinate 12.5 mg daily for rate control. Amiodarone as above. -Anticoagulation deferred historically.  # AKI  Cr elevated at 1.47 on admission (baseline ~1.0) -S/p gentle IV hydration -Continue to hold nephrotoxic meds until renal function improves.  # Interstitial lung disease Patient with hx of ILD followed by pulmonology  outpatient. On chronic 4-5L supplemental O2 at baseline. Currently on baseline requirement in the ED. -Monitor respiratory status closely with addition of amiodarone. -Management per primary.    This patient's plan of care was discussed and created with Dr. Melton Alar and she is in agreement.  Signed: Gale Journey, PA-C  04/16/2023, 10:18 AM [facility]

## 2023-04-16 NOTE — Assessment & Plan Note (Addendum)
Hemoglobin A1c 8.7.  Does not appear to be on any medications at home. Sliding scale insulin coverage Continue gabapentin for neuropathy

## 2023-04-16 NOTE — Assessment & Plan Note (Signed)
Patient states he is chronically on 4 L of oxygen at home.  History of fibrosis.

## 2023-04-16 NOTE — Assessment & Plan Note (Deleted)
Creatinine 1.47 up from baseline of 1.03 Fluid bolus in ER.  Recheck creatinine tomorrow.

## 2023-04-16 NOTE — Progress Notes (Signed)
Progress Note   Patient: [name] ZOX:[id number] DOB: [birth year] DOA: 04/15/2023     0 DOS: the patient was seen and examined on 04/16/2023   Brief hospital course: 89 year old man past medical history of chronic systolic congestive heart failure, defibrillator placement, atrial fibrillation, CAD, ventricular tachycardia, carotid stenosis, AAA, type 2 diabetes mellitus, chronic hypotension.  Patient was walking to get back to his chair and his ICD fired.  Patient came to the emergency room.  ICD was interrogated showed ventricular tachycardia.  10/14.  Cardiology will amiodarone load orally.  Will get physical therapy to evaluate.  Assessment and Plan: * Ventricular tachycardia (HCC) Seen on ICD interrogation.  Patient was shocked.  Cardiology saw patient and will load amiodarone.  AICD discharge Ventricular tachycardia on interrogation of ICD  Myocardial ischemia Likely from ICD firing.  AKI (acute kidney injury) (HCC) Creatinine 1.47 up from baseline of 1.03 Fluid bolus in ER.  Recheck creatinine tomorrow.  Chronic hypotension Continue midodrine  Chronic systolic CHF (congestive heart failure), NYHA class 3 (HCC) Clinically euvolemic to dry Holding furosemide, Entresto tonight due to soft blood pressure and AKI to resume as appropriate Continue metoprolol  Uncontrolled type 2 diabetes mellitus with hyperglycemia, without long-term current use of insulin (HCC) Hemoglobin A1c 8.7.  Does not appear to be on any medications at home. Sliding scale insulin coverage Continue gabapentin for neuropathy  Atrial fibrillation (HCC) Not on major anticoagulation Continue aspirin and metoprolol  Chronic respiratory failure with hypoxia [facility]) Patient states he is chronically on 4 L of oxygen at home.  History of fibrosis.  AAA (abdominal aortic aneurysm) (HCC) Carotid artery stenosis Continue aspirin Not currently on statin per med review        Subjective: Patient  has been fatigued for the past week and not feeling up for walking around.  His defibrillator shocked him yesterday and he fell back into his chair.  Interrogation of the defibrillator shows ventricular tachycardia.  Currently feels okay.  Physical Exam: Vitals:   04/16/23 0820 04/16/23 0824 04/16/23 0900 04/16/23 1000  BP: 127/76  113/69 (!) 105/90  Pulse: (!) 104  (!) 59 64  Resp: 19  15 (!) 23  Temp:  97.9 F (36.6 C)    TempSrc:  Oral    SpO2: 100%  100% 100%  Weight:      Height:       Physical Exam HENT:     Head: Normocephalic.     Mouth/Throat:     Pharynx: No oropharyngeal exudate.  Eyes:     General: Lids are normal.     Conjunctiva/sclera: Conjunctivae normal.  Cardiovascular:     Rate and Rhythm: Normal rate and regular rhythm.     Heart sounds: Normal heart sounds, S1 normal and S2 normal.  Pulmonary:     Breath sounds: Examination of the right-lower field reveals decreased breath sounds. Examination of the left-lower field reveals decreased breath sounds. Decreased breath sounds present. No wheezing, rhonchi or rales.  Abdominal:     Palpations: Abdomen is soft.     Tenderness: There is no abdominal tenderness.  Musculoskeletal:     Right lower leg: No swelling.     Left lower leg: No swelling.  Skin:    General: Skin is warm.     Findings: No rash.  Neurological:     Mental Status: He is alert and oriented to person, place, and time.     Data Reviewed:  Hemoglobin 13, platelet count 159, white blood  cell count 10.0, creatinine 1.47  Family Communication: Updated son on the phone  Disposition: Status is: Observation Potential discharge tomorrow.  Will recheck creatinine tomorrow.  Amiodarone oral loading today.  Ambulate with physical therapy.  Planned Discharge Destination: Home    Time spent: 27 minutes  Author: Alford Highland, MD 04/16/2023 12:23 PM  For on call review [email or web address].

## 2023-04-16 NOTE — Assessment & Plan Note (Addendum)
Continue midodrine

## 2023-04-16 NOTE — Assessment & Plan Note (Deleted)
Continue tamsulosin.

## 2023-04-16 NOTE — IPAL (Signed)
  Interdisciplinary Goals of Care Family Meeting   Date carried out: 04/16/2023  Location of the meeting: Bedside  Member's involved: Physician  Durable Power of Attorney or acting medical decision maker: patient    Discussion: We discussed goals of care for [employer] .   I have reviewed medical records including EPIC notes, labs and imaging, met and assessed patient to discuss major active diagnoses, plan of care, natural trajectory, prognosis, GOC, EOL wishes, disposition and options including Full code/DNI/DNR and the concept of comfort care if DNR is elected. Questions and concerns were addressed. They are in agreement to continue current plan of care . Election for DNR status.  Patient states that when the Shaune Pollack is ready for him he is also ready.   Code status:   Code Status: Limited: Do not attempt resuscitation (DNR) -DNR-LIMITED -Do Not Intubate/DNI    Disposition: Continue current acute care  Time spent for the meeting: 30    Andris Baumann, MD  04/16/2023, 1:40 AM

## 2023-04-16 NOTE — Assessment & Plan Note (Signed)
Seen on ICD interrogation.  Patient was shocked.  Cardiology saw patient and will load amiodarone.

## 2023-04-17 DIAGNOSIS — I259 Chronic ischemic heart disease, unspecified: Secondary | ICD-10-CM | POA: Diagnosis not present

## 2023-04-17 DIAGNOSIS — Z4502 Encounter for adjustment and management of automatic implantable cardiac defibrillator: Secondary | ICD-10-CM | POA: Diagnosis not present

## 2023-04-17 DIAGNOSIS — N1831 Chronic kidney disease, stage 3a: Secondary | ICD-10-CM

## 2023-04-17 DIAGNOSIS — I472 Ventricular tachycardia, unspecified: Secondary | ICD-10-CM | POA: Diagnosis not present

## 2023-04-17 LAB — BASIC METABOLIC PANEL
Anion gap: 9 (ref 5–15)
BUN: 38 mg/dL — ABNORMAL HIGH (ref 8–23)
CO2: 27 mmol/L (ref 22–32)
Calcium: 9.1 mg/dL (ref 8.9–10.3)
Chloride: 99 mmol/L (ref 98–111)
Creatinine, Ser: 1.42 mg/dL — ABNORMAL HIGH (ref 0.61–1.24)
GFR, Estimated: 46 mL/min — ABNORMAL LOW (ref >60)
Glucose, Bld: 176 mg/dL — ABNORMAL HIGH (ref 70–99)
Potassium: 4.5 mmol/L (ref 3.5–5.1)
Sodium: 135 mmol/L (ref 135–145)

## 2023-04-17 LAB — GLUCOSE, CAPILLARY: Glucose-Capillary: 193 mg/dL — ABNORMAL HIGH (ref 70–99)

## 2023-04-17 MED ORDER — MIDODRINE HCL 5 MG PO TABS: 5.0000 mg | ORAL_TABLET | Freq: Three times a day (TID) | ORAL | 0 refills | Status: DC

## 2023-04-17 MED ORDER — ACETAMINOPHEN 500 MG PO TABS: 500.0000 mg | ORAL_TABLET | Freq: Every day | ORAL | Status: DC | PRN

## 2023-04-17 MED ORDER — SODIUM CHLORIDE 0.9 % IV BOLUS
250.0000 mL | Freq: Once | INTRAVENOUS | Status: AC
  Administered 2023-04-17: 250 mL via INTRAVENOUS

## 2023-04-17 MED ORDER — AMIODARONE HCL 200 MG PO TABS: ORAL_TABLET | ORAL | 0 refills | Status: DC

## 2023-04-17 NOTE — Progress Notes (Signed)
[facility] CLINIC CARDIOLOGY CONSULT NOTE       Patient ID: [name] MRN: [id number] DOB/AGE: 1928-07-15 89 y.o.  Admit date: 04/15/2023 Referring Physician Dr. Lindajo Royal Primary Physician Lenon Oms, NP Primary Cardiologist Dr. Dorothyann Peng Reason for Consultation ventricular tachycardia/AICD discharge  HPI: [name] is a 89 y.o. male  with a past medical history of coronary artery disease, atrial fibrillation not on anticoagulation, chronic systolic CHF, ventricular tachycardia, s/p AICD implant 01/2020, hyperlipidemia, type 2 diabetes, bilateral carotid artery stenosis, AAA who presented to the ED on 04/15/2023 for AICD discharge. Cardiology was consulted for further evaluation.   Interval history: -Patient feeling well this AM, denies any recurrence of chest pain/shortness of breath.  -Appears to be tolerating amiodarone well.  -Remains in NSR on tele, had episode of atrial fibrillation overnight and converted. Denies palpitations.   Review of systems complete and found to be negative unless listed above    Past Medical History:  Diagnosis Date   CAD (coronary artery disease)    Cardiac defibrillator in place 01/2020   CHF (congestive heart failure) (HCC)    Diabetes mellitus without complication (HCC)    History of myocardial infarction 01/2020   Peripheral neuropathy    Restless leg syndrome     Past Surgical History:  Procedure Laterality Date   APPENDECTOMY     CARDIAC DEFIBRILLATOR PLACEMENT Left 01/2020   PARTIAL KNEE ARTHROPLASTY Left     Medications Prior to Admission  Medication Sig Dispense Refill Last Dose   aspirin 81 MG EC tablet Take 81 mg by mouth daily.   04/15/2023 at 1100   brimonidine (ALPHAGAN) 0.2 % ophthalmic solution Place 1 drop into both eyes 2 (two) times daily.   04/15/2023   cephALEXin (KEFLEX) 250 MG capsule Take 250 mg by mouth every morning.   04/15/2023   cetirizine (ZYRTEC) 10 MG tablet Take 10 mg by mouth daily.    04/15/2023   diphenhydramine-acetaminophen (TYLENOL PM) 25-500 MG TABS tablet Take 2 tablets by mouth at bedtime as needed.   prn   dorzolamide (TRUSOPT) 2 % ophthalmic solution Place 1 drop into both eyes 2 (two) times daily.   04/15/2023   furosemide (LASIX) 40 MG tablet Take 1 tablet (40 mg total) by mouth daily. 30 tablet  04/15/2023   gabapentin (NEURONTIN) 300 MG capsule Take 300 mg by mouth 2 (two) times daily.   04/15/2023   latanoprost (XALATAN) 0.005 % ophthalmic solution Place 1 drop into both eyes at bedtime.      metoprolol succinate (TOPROL-XL) 25 MG 24 hr tablet Take 12.5 mg by mouth daily.   04/15/2023   [DISCONTINUED] acetaminophen (TYLENOL) 500 MG tablet Take 1 tablet (500 mg total) by mouth every 6 (six) hours as needed. (Patient taking differently: Take 500 mg by mouth daily as needed.)  0 prn   albuterol (VENTOLIN HFA) 108 (90 Base) MCG/ACT inhaler Inhale 2 puffs into the lungs every 6 (six) hours as needed for wheezing or shortness of breath. Use 2 times daily x5 days, then every 6 hours as needed. (Patient not taking: Reported on 04/16/2023) 8 g 0 Not Taking   ENTRESTO 24-26 MG SMARTSIG:1 Tablet(s) By Mouth Every 12 Hours (Patient not taking: Reported on 04/16/2023) 60 tablet  Not Taking   [DISCONTINUED] midodrine (PROAMATINE) 5 MG tablet Take 1 tablet (5 mg total) by mouth 3 (three) times daily with meals. (Patient not taking: Reported on 04/16/2023) 90 tablet 1 Not Taking   Social History  Socioeconomic History   Marital status: Married    Spouse name: Not on file   Number of children: Not on file   Years of education: Not on file   Highest education level: Not on file  Occupational History   Not on file  Tobacco Use   Smoking status: Never   Smokeless tobacco: Never  Vaping Use   Vaping status: Never Used  Substance and Sexual Activity   Alcohol use: Not Currently   Drug use: Not Currently   Sexual activity: Not on file  Other Topics Concern   Not on file   Social History Narrative   Not on file   Social Determinants of Health   Financial Resource Strain: Low Risk  (10/05/2022)   Received from [facility]   Overall Financial Resource Strain (CARDIA)    Difficulty of Paying Living Expenses: Not hard at all  Food Insecurity: No Food Insecurity (04/16/2023)   Hunger Vital Sign    Worried About Running Out of Food in the Last Year: Never true    Ran Out of Food in the Last Year: Never true  Transportation Needs: No Transportation Needs (04/16/2023)   PRAPARE - Administrator, Civil Service (Medical): No    Lack of Transportation (Non-Medical): No  Physical Activity: Not on file  Stress: Not on file  Social Connections: Not on file  Intimate Partner Violence: Not At Risk (04/16/2023)   Humiliation, Afraid, Rape, and Kick questionnaire    Fear of Current or Ex-Partner: No    Emotionally Abused: No    Physically Abused: No    Sexually Abused: No    History reviewed. No pertinent family history.   Vitals:   04/16/23 2256 04/16/23 2258 04/17/23 0411 04/17/23 0835  BP: (!) 72/60 96/61 107/68 114/79  Pulse: 67 79 64 81  Resp: 18  18 17   Temp: 97.8 F (36.6 C)  97.7 F (36.5 C) 97.6 F (36.4 C)  TempSrc: Oral  Oral   SpO2: 98%  99% 97%  Weight:      Height:        PHYSICAL EXAM General: Chronically ill-appearing, well nourished, in no acute distress laying nearly flat in hospital bed. HEENT: Normocephalic and atraumatic. Neck: No JVD.  Lungs: Normal respiratory effort on 5L [place]. Clear bilaterally to auscultation. No wheezes, crackles, rhonchi.  Heart: HRRR. Normal S1 and S2 without gallops or murmurs.  Abdomen: Non-distended appearing.  Msk: Normal strength and tone for age. Extremities: Warm and well perfused. No clubbing, cyanosis.  No edema.  Neuro: Alert and oriented X 3. Psych: Answers questions appropriately.   Labs: Basic Metabolic Panel: Recent Labs    04/15/23 2317 04/17/23 0516   NA 137 135  K 4.3 4.5  CL 101 99  CO2 25 27  GLUCOSE 264* 176*  BUN 42* 38*  CREATININE 1.47* 1.42*  CALCIUM 8.7* 9.1  MG 2.2  --    Liver Function Tests: Recent Labs    04/15/23 2317  AST 24  ALT 10  ALKPHOS 72  BILITOT 0.7  PROT 7.9  ALBUMIN 3.6   No results for input(s): "LIPASE", "AMYLASE" in the last 72 hours. CBC: Recent Labs    04/15/23 2317 04/16/23 0735  WBC 9.5 10.0  NEUTROABS 5.5  --   HGB 13.3 13.0  HCT 42.0 41.4  MCV 107.1* 106.2*  PLT 164 159   Cardiac Enzymes: Recent Labs    04/15/23 2317 04/16/23 0109  TROPONINIHS  201* 201*   BNP: No results for input(s): "BNP" in the last 72 hours. D-Dimer: No results for input(s): "DDIMER" in the last 72 hours. Hemoglobin A1C: Recent Labs    04/16/23 0220  HGBA1C 8.7*   Fasting Lipid Panel: No results for input(s): "CHOL", "HDL", "LDLCALC", "TRIG", "CHOLHDL", "LDLDIRECT" in the last 72 hours. Thyroid Function Tests: No results for input(s): "TSH", "T4TOTAL", "T3FREE", "THYROIDAB" in the last 72 hours.  Invalid input(s): "FREET3" Anemia Panel: No results for input(s): "VITAMINB12", "FOLATE", "FERRITIN", "TIBC", "IRON", "RETICCTPCT" in the last 72 hours.   Radiology: [facility] Chest Port 1 View  Result Date: 04/16/2023 CLINICAL DATA:  Defibrillator shock EXAM: PORTABLE CHEST 1 VIEW COMPARISON:  Chest x-ray 04/24/2022 FINDINGS: The heart is enlarged, unchanged. Left-sided pacemaker is again seen. There central pulmonary vascular congestion. Right perihilar nodular density measuring 13 mm again noted. There scattered multifocal areas of interstitial opacities in the mid and lower lungs similar to the prior study. There is no new focal lung infiltrate, pleural effusion or pneumothorax. No acute fractures are seen. IMPRESSION: 1. Cardiomegaly with central pulmonary vascular congestion. 2. Unchanged right perihilar nodular density measuring 13 mm. 3. Stable chronic interstitial lung disease. Electronically Signed    By: Darliss Cheney M.D.   On: 04/16/2023 00:00    ECHO 10/2022: 1. The left ventricle is mildly dilated in size with normal wall thickness.    2. The left ventricular systolic function is severely decreased, LVEF is  visually estimated at 30%.    3. There is thinning and akinesis of the anteroapical and apical segments.    4. Mitral annular calcification is present (mild).    5. The mitral valve leaflets are mildly thickened with normal leaflet  mobility.   6. The aortic valve is probably trileaflet with mildly thickened leaflets  with mildly reduced excursion.    7. There is mild aortic regurgitation.    8. The right ventricle is normal in size, with normal systolic function.   TELEMETRY reviewed by me 04/17/2023: Sinus rhythm with PACs rate 60-70s  EKG reviewed by me: NSR 1st degree AVB rate 67 bpm  Data reviewed by me 04/17/2023: last 24h vitals tele labs imaging I/O hospitalist progress note  Principal Problem:   Ventricular tachycardia (HCC) Active Problems:   Chronic systolic CHF (congestive heart failure), NYHA class 3 (HCC)   Coronary artery disease involving native coronary artery of native heart   Atrial fibrillation (HCC)   Carotid stenosis   AKI (acute kidney injury) (HCC)   AICD discharge   Chronic hypotension   Uncontrolled type 2 diabetes mellitus with hyperglycemia, without long-term current use of insulin (HCC)   AAA (abdominal aortic aneurysm) (HCC)   Myocardial ischemia   Chronic respiratory failure with hypoxia (HCC)    ASSESSMENT AND PLAN:  [name] is a 89 y.o. male  with a past medical history of coronary artery disease, atrial fibrillation, chronic systolic CHF, ventricular tachycardia, AICD in place, hyperlipidemia, type 2 diabetes, bilateral carotid artery stenosis, AAA  who presented to the ED on 04/15/2023 for AICD discharge. Cardiology was consulted for further evaluation.   # AICD discharge # Ventricular tachycardia # Elevated  troponin Patient with ICD discharge x1 on 10/13, apparent VT on device interrogation. No recurrence since admission.  -Amiodarone 400 mg bid for 10 days, then taper as ordered.  -Troponin elevation most consistent with demand/supply mismatch and not ACS in the setting of AICD discharge.  # Chronic HFrEF # Hypotension With most recent  EF 30% on echo 10/2022. Has been tolerating GDMT well. -S/p gentle IV hydration -Home furosemide, entresto currently held given BP and renal function. Plan to restart GDMT as BP and renal function allow on outpatient basis.  # Paroxysmal atrial fibrillation Patient with hx of pAF, on metoprolol for rate control at home. Historically not anticoagulated given hx of bleeding and age. -Continue metoprolol succinate 12.5 mg daily for rate control. Amiodarone as above. -Anticoagulation deferred historically.  # AKI  Cr elevated at 1.47 on admission (baseline ~1.0), slightly improved to 1.42 on AM labs. -S/p gentle IV hydration -Continue to hold nephrotoxic meds until renal function improves.  # Interstitial lung disease Patient with hx of ILD followed by pulmonology outpatient. On chronic 4-5L supplemental O2 at baseline. Currently on baseline requirement in the ED. -Monitor respiratory status closely with addition of amiodarone. -Management per primary.   Ok for discharge today from a cardiac perspective. Will arrange for follow up in clinic with Dr. Juliann Pares on 10/22, heart failure clinic appointment scheduled for 10/29.  This patient's plan of care was discussed and created with Dr. Melton Alar and she is in agreement.  Signed: Gale Journey, PA-C  04/17/2023, 10:54 AM [facility] Cardiology

## 2023-04-17 NOTE — Plan of Care (Signed)
Problem: Health Behavior/Discharge Planning: Goal: Ability to manage health-related needs will improve Outcome: Progressing   Problem: Coping: Goal: Level of anxiety will decrease Outcome: Progressing   Problem: Elimination: Goal: Will not experience complications related to bowel motility Outcome: Progressing   Problem: Safety: Goal: Ability to remain free from injury will improve Outcome: Progressing   Problem: Skin Integrity: Goal: Risk for impaired skin integrity will decrease Outcome: Progressing

## 2023-04-17 NOTE — Plan of Care (Signed)
  Problem: Nutritional: Goal: Maintenance of adequate nutrition will improve Outcome: Progressing Goal: Progress toward achieving an optimal weight will improve Outcome: Progressing   Problem: Skin Integrity: Goal: Risk for impaired skin integrity will decrease Outcome: Progressing   Problem: Health Behavior/Discharge Planning: Goal: Ability to manage health-related needs will improve Outcome: Progressing   Problem: Coping: Goal: Level of anxiety will decrease Outcome: Progressing   Problem: Elimination: Goal: Will not experience complications related to bowel motility Outcome: Progressing   Problem: Safety: Goal: Ability to remain free from injury will improve Outcome: Progressing   Problem: Skin Integrity: Goal: Risk for impaired skin integrity will decrease Outcome: Progressing

## 2023-04-17 NOTE — Discharge Summary (Signed)
Physician Discharge Summary   Patient: [name] MRN: [id number] DOB: [birth year]  Admit date:     04/15/2023  Discharge date: 04/17/23  Discharge Physician: Alford Highland   PCP: Myrene Buddy, NP   Recommendations at discharge:   Follow-up PCP 5 days Follow-up cardiology 1 week  Discharge Diagnoses: Principal Problem:   Ventricular tachycardia (HCC) Active Problems:   AICD discharge   Myocardial ischemia   CKD stage 3a, GFR 45-59 ml/min (HCC)   Chronic systolic CHF (congestive heart failure), NYHA class 3 (HCC)   Chronic hypotension   Atrial fibrillation (HCC)   Uncontrolled type 2 diabetes mellitus with hyperglycemia, without long-term current use of insulin (HCC)   Coronary artery disease involving native coronary artery of native heart   Carotid stenosis   AAA (abdominal aortic aneurysm) (HCC)   Chronic respiratory failure with hypoxia [facility])    Hospital Course: 89 year old man past medical history of chronic systolic congestive heart failure, defibrillator placement, atrial fibrillation, CAD, ventricular tachycardia, carotid stenosis, AAA, type 2 diabetes mellitus, chronic hypotension.  Patient was walking to get back to his chair and his ICD fired.  Patient came to the emergency room.  ICD was interrogated showed ventricular tachycardia.  10/14.  Cardiology will amiodarone load orally.  Will get physical therapy to evaluate. 10/15.  Patient feeling okay offers no complaints.  Patient interested in going home.  Assessment and Plan: * Ventricular tachycardia (HCC) Seen on ICD interrogation.  Patient was shocked.  Cardiology recommended amiodarone loading 400 mg twice a day for 10 days, 400 mg daily for 10 days then 200 mg daily after that.  AICD discharge Ventricular tachycardia on interrogation of ICD  CKD stage 3a, GFR 45-59 ml/min (HCC) Looking back an outpatient creatinine was 1.56 on 4/524.  Today's creatinine 1.42 with a GFR 46.  Acute kidney  injury ruled out.  I did give him a 250 mL liter fluid bolus  Myocardial ischemia Likely from ICD firing.  Chronic hypotension Continue midodrine  Chronic systolic CHF (congestive heart failure), NYHA class 3 (HCC) Clinically euvolemic to dry Continue metoprolol Patient does not take Entresto or furosemide at home.  Uncontrolled type 2 diabetes mellitus with hyperglycemia, without long-term current use of insulin (HCC) Hemoglobin A1c 8.7.  Does not appear to be on any medications at home. Continue gabapentin for neuropathy  Atrial fibrillation (HCC) Not on major anticoagulation Continue aspirin and metoprolol  Chronic respiratory failure with hypoxia [facility]) Patient states he is chronically on 4 L of oxygen at home.  History of fibrosis.  AAA (abdominal aortic aneurysm) (HCC) Carotid artery stenosis Continue aspirin Not currently on statin per med review         Consultants: Cardiology Procedures performed: ICD interrogation Disposition: Home health Diet recommendation:  Cardiac diet DISCHARGE MEDICATION: Allergies as of 04/17/2023       Reactions   Sulfamethoxazole-trimethoprim Other (See Comments)        Medication List     STOP taking these medications    cephALEXin 250 MG capsule Commonly known as: KEFLEX   diphenhydramine-acetaminophen 25-500 MG Tabs tablet Commonly known as: TYLENOL PM   dorzolamide 2 % ophthalmic solution Commonly known as: TRUSOPT   Entresto 24-26 MG Generic drug: sacubitril-valsartan   furosemide 40 MG tablet Commonly known as: LASIX   latanoprost 0.005 % ophthalmic solution Commonly known as: XALATAN       TAKE these medications    acetaminophen 500 MG tablet Commonly known as: TYLENOL Take 1 tablet (500  mg total) by mouth daily as needed.   albuterol 108 (90 Base) MCG/ACT inhaler Commonly known as: VENTOLIN HFA Inhale 2 puffs into the lungs every 6 (six) hours as needed for wheezing or shortness of breath.  Use 2 times daily x5 days, then every 6 hours as needed.   amiodarone 200 MG tablet Commonly known as: PACERONE Take 2 tablets (400 mg total) by mouth 2 (two) times daily for 9 days, THEN 2 tablets (400 mg total) daily for 10 days, THEN 1 tablet (200 mg total) daily for 11 days. Start taking on: April 17, 2023 What changed: See the new instructions.   aspirin EC 81 MG tablet Take 81 mg by mouth daily.   brimonidine 0.2 % ophthalmic solution Commonly known as: ALPHAGAN Place 1 drop into both eyes 2 (two) times daily.   cetirizine 10 MG tablet Commonly known as: ZYRTEC Take 10 mg by mouth daily.   gabapentin 300 MG capsule Commonly known as: NEURONTIN Take 300 mg by mouth 2 (two) times daily.   metoprolol succinate 25 MG 24 hr tablet Commonly known as: TOPROL-XL Take 12.5 mg by mouth daily.   midodrine 5 MG tablet Commonly known as: PROAMATINE Take 1 tablet (5 mg total) by mouth 3 (three) times daily with meals.        Follow-up Information     Alwyn Pea, MD. Go in 1 week(s).   Specialties: Cardiology, Internal Medicine Why: Hospital follow up appointment scheduled with Dr. Juliann Pares on 10/22 at 3 PM. Heart failure clinic appointment scheduled at [facility] on 10/29 at 1 PM. Contact information: [address] [place] [address] [phone]         Myrene Buddy, NP Follow up in 5 day(s).   Specialty: Internal Medicine Contact information: [address] [place] [address] [phone]                Discharge Exam: Filed Weights   04/15/23 2307  Weight: 71.7 kg   Physical Exam HENT:     Head: Normocephalic.     Mouth/Throat:     Pharynx: No oropharyngeal exudate.  Eyes:     General: Lids are normal.     Conjunctiva/sclera: Conjunctivae normal.  Cardiovascular:     Rate and Rhythm: Normal rate and regular rhythm.     Heart sounds: Normal heart sounds, S1 normal and S2 normal.  Pulmonary:     Breath sounds:  Examination of the right-lower field reveals decreased breath sounds. Examination of the left-lower field reveals decreased breath sounds. Decreased breath sounds present. No wheezing, rhonchi or rales.  Abdominal:     Palpations: Abdomen is soft.     Tenderness: There is no abdominal tenderness.  Musculoskeletal:     Right lower leg: No swelling.     Left lower leg: No swelling.  Skin:    General: Skin is warm.     Findings: No rash.  Neurological:     Mental Status: He is alert and oriented to person, place, and time.      Condition at discharge: stable  The results of significant diagnostics from this hospitalization (including imaging, microbiology, ancillary and laboratory) are listed below for reference.   Imaging Studies: DG Chest Port 1 View  Result Date: 04/16/2023 CLINICAL DATA:  Defibrillator shock EXAM: PORTABLE CHEST 1 VIEW COMPARISON:  Chest x-ray 04/24/2022 FINDINGS: The heart is enlarged, unchanged. Left-sided pacemaker is again seen. There central pulmonary vascular congestion. Right perihilar nodular density measuring  13 mm again noted. There scattered multifocal areas of interstitial opacities in the mid and lower lungs similar to the prior study. There is no new focal lung infiltrate, pleural effusion or pneumothorax. No acute fractures are seen. IMPRESSION: 1. Cardiomegaly with central pulmonary vascular congestion. 2. Unchanged right perihilar nodular density measuring 13 mm. 3. Stable chronic interstitial lung disease. Electronically Signed   By: Darliss Cheney M.D.   On: 04/16/2023 00:00       Labs: CBC: Recent Labs  Lab 04/15/23 2317 04/16/23 0735  WBC 9.5 10.0  NEUTROABS 5.5  --   HGB 13.3 13.0  HCT 42.0 41.4  MCV 107.1* 106.2*  PLT 164 159   Basic Metabolic Panel: Recent Labs  Lab 04/15/23 2317 04/17/23 0516  NA 137 135  K 4.3 4.5  CL 101 99  CO2 25 27  GLUCOSE 264* 176*  BUN 42* 38*  CREATININE 1.47* 1.42*  CALCIUM 8.7* 9.1  MG 2.2  --     Liver Function Tests: Recent Labs  Lab 04/15/23 2317  AST 24  ALT 10  ALKPHOS 72  BILITOT 0.7  PROT 7.9  ALBUMIN 3.6   CBG: Recent Labs  Lab 04/16/23 0742 04/16/23 0746 04/16/23 1149 04/16/23 2153 04/17/23 0831  GLUCAP 61* 116* 216* 140* 193*    Discharge time spent: greater than 30 minutes.  Signed: Alford Highland, MD [facility] 04/17/2023

## 2023-04-17 NOTE — Assessment & Plan Note (Signed)
Looking back an outpatient creatinine was 1.56 on 4/524.  Today's creatinine 1.42 with a GFR 46.  Acute kidney injury ruled out.  I did give him a 250 mL liter fluid bolus

## 2023-04-17 NOTE — TOC Transition Note (Signed)
Transition of Care [facility]) - CM/SW Discharge Note   Patient Details  Name: [name] MRN: [id number] Date of Birth: 1929-04-21  [facility]) CM/SW Contact:  Truddie Hidden, RN Phone Number: 04/17/2023, 11:22 AM   Clinical Narrative:    Spoke with patient and his son regarding discharge today. Patient's son will transport him home and is aware to bring his home oxygen for transport. RNCM discussed therapy's recommendation for HH. Patient refused HH. He stated he would think about it. RNCM advised patient to follow up with his PCP post discharge if he changed his mind.   TOC signing off.           Patient Goals and CMS Choice      Discharge Placement                         Discharge Plan and Services Additional resources added to the After Visit Summary for                                       Social Determinants of Health (SDOH) Interventions SDOH Screenings   Food Insecurity: No Food Insecurity (04/16/2023)  Housing: Low Risk  (04/16/2023)  Transportation Needs: No Transportation Needs (04/16/2023)  Utilities: Not At Risk (04/16/2023)  Financial Resource Strain: Low Risk  (10/05/2022)   Received from [facility]  Tobacco Use: Low Risk  (04/16/2023)     Readmission Risk Interventions    10/26/2020   11:25 AM  Readmission Risk Prevention Plan  Transportation Screening Complete  PCP or Specialist Appt within 3-5 Days Complete  HRI or Home Care Consult Complete  Social Work Consult for Recovery Care Planning/Counseling Complete  Palliative Care Screening Not Applicable  Medication Review [employer]) Complete

## 2023-04-20 ENCOUNTER — Other Ambulatory Visit: Payer: Self-pay

## 2023-04-20 ENCOUNTER — Emergency Department: Payer: Medicare Other

## 2023-04-20 ENCOUNTER — Inpatient Hospital Stay
Admission: EM | Admit: 2023-04-20 | Discharge: 2023-04-25 | DRG: 291 | Disposition: A | Discharge status: Hospice | Payer: Medicare Other | Attending: Obstetrics and Gynecology | Admitting: Obstetrics and Gynecology

## 2023-04-20 DIAGNOSIS — Z8679 Personal history of other diseases of the circulatory system: Secondary | ICD-10-CM

## 2023-04-20 DIAGNOSIS — J9621 Acute and chronic respiratory failure with hypoxia: Secondary | ICD-10-CM | POA: Diagnosis present

## 2023-04-20 DIAGNOSIS — E1142 Type 2 diabetes mellitus with diabetic polyneuropathy: Secondary | ICD-10-CM | POA: Diagnosis present

## 2023-04-20 DIAGNOSIS — I130 Hypertensive heart and chronic kidney disease with heart failure and stage 1 through stage 4 chronic kidney disease, or unspecified chronic kidney disease: Principal | ICD-10-CM | POA: Diagnosis present

## 2023-04-20 DIAGNOSIS — R0602 Shortness of breath: Secondary | ICD-10-CM | POA: Diagnosis not present

## 2023-04-20 DIAGNOSIS — E1122 Type 2 diabetes mellitus with diabetic chronic kidney disease: Secondary | ICD-10-CM | POA: Diagnosis present

## 2023-04-20 DIAGNOSIS — I714 Abdominal aortic aneurysm, without rupture, unspecified: Secondary | ICD-10-CM | POA: Diagnosis present

## 2023-04-20 DIAGNOSIS — N179 Acute kidney failure, unspecified: Secondary | ICD-10-CM | POA: Diagnosis present

## 2023-04-20 DIAGNOSIS — Z7982 Long term (current) use of aspirin: Secondary | ICD-10-CM

## 2023-04-20 DIAGNOSIS — Z95810 Presence of automatic (implantable) cardiac defibrillator: Secondary | ICD-10-CM

## 2023-04-20 DIAGNOSIS — Z833 Family history of diabetes mellitus: Secondary | ICD-10-CM

## 2023-04-20 DIAGNOSIS — Z9359 Other cystostomy status: Secondary | ICD-10-CM

## 2023-04-20 DIAGNOSIS — R0902 Hypoxemia: Principal | ICD-10-CM

## 2023-04-20 DIAGNOSIS — Z9981 Dependence on supplemental oxygen: Secondary | ICD-10-CM

## 2023-04-20 DIAGNOSIS — Z882 Allergy status to sulfonamides status: Secondary | ICD-10-CM

## 2023-04-20 DIAGNOSIS — I2510 Atherosclerotic heart disease of native coronary artery without angina pectoris: Secondary | ICD-10-CM | POA: Diagnosis present

## 2023-04-20 DIAGNOSIS — I5023 Acute on chronic systolic (congestive) heart failure: Secondary | ICD-10-CM | POA: Diagnosis present

## 2023-04-20 DIAGNOSIS — I472 Ventricular tachycardia, unspecified: Secondary | ICD-10-CM | POA: Diagnosis present

## 2023-04-20 DIAGNOSIS — J8410 Pulmonary fibrosis, unspecified: Secondary | ICD-10-CM | POA: Diagnosis present

## 2023-04-20 DIAGNOSIS — I252 Old myocardial infarction: Secondary | ICD-10-CM

## 2023-04-20 DIAGNOSIS — Z79899 Other long term (current) drug therapy: Secondary | ICD-10-CM

## 2023-04-20 DIAGNOSIS — Z96652 Presence of left artificial knee joint: Secondary | ICD-10-CM | POA: Diagnosis present

## 2023-04-20 DIAGNOSIS — I6523 Occlusion and stenosis of bilateral carotid arteries: Secondary | ICD-10-CM | POA: Diagnosis present

## 2023-04-20 DIAGNOSIS — R5381 Other malaise: Secondary | ICD-10-CM | POA: Diagnosis present

## 2023-04-20 DIAGNOSIS — I480 Paroxysmal atrial fibrillation: Secondary | ICD-10-CM

## 2023-04-20 DIAGNOSIS — E785 Hyperlipidemia, unspecified: Secondary | ICD-10-CM | POA: Diagnosis present

## 2023-04-20 DIAGNOSIS — Z7901 Long term (current) use of anticoagulants: Secondary | ICD-10-CM

## 2023-04-20 DIAGNOSIS — Z66 Do not resuscitate: Secondary | ICD-10-CM | POA: Diagnosis present

## 2023-04-20 DIAGNOSIS — N1831 Chronic kidney disease, stage 3a: Secondary | ICD-10-CM | POA: Diagnosis present

## 2023-04-20 DIAGNOSIS — G2581 Restless legs syndrome: Secondary | ICD-10-CM | POA: Diagnosis present

## 2023-04-20 DIAGNOSIS — D72829 Elevated white blood cell count, unspecified: Secondary | ICD-10-CM | POA: Diagnosis present

## 2023-04-20 LAB — COMPREHENSIVE METABOLIC PANEL
ALT: 17 U/L (ref 0–44)
AST: 26 U/L (ref 15–41)
Albumin: 3.8 g/dL (ref 3.5–5.0)
Alkaline Phosphatase: 98 U/L (ref 38–126)
Anion gap: 12 (ref 5–15)
BUN: 32 mg/dL — ABNORMAL HIGH (ref 8–23)
CO2: 25 mmol/L (ref 22–32)
Calcium: 9.2 mg/dL (ref 8.9–10.3)
Chloride: 100 mmol/L (ref 98–111)
Creatinine, Ser: 1.4 mg/dL — ABNORMAL HIGH (ref 0.61–1.24)
GFR, Estimated: 47 mL/min — ABNORMAL LOW (ref >60)
Glucose, Bld: 284 mg/dL — ABNORMAL HIGH (ref 70–99)
Potassium: 5 mmol/L (ref 3.5–5.1)
Sodium: 137 mmol/L (ref 135–145)
Total Bilirubin: 0.9 mg/dL (ref 0.3–1.2)
Total Protein: 8.7 g/dL — ABNORMAL HIGH (ref 6.5–8.1)

## 2023-04-20 LAB — BRAIN NATRIURETIC PEPTIDE: B Natriuretic Peptide: 1117.9 pg/mL — ABNORMAL HIGH (ref 0.0–100.0)

## 2023-04-20 LAB — CBC
HCT: 43 % (ref 39.0–52.0)
Hemoglobin: 13.4 g/dL (ref 13.0–17.0)
MCH: 33.5 pg (ref 26.0–34.0)
MCHC: 31.2 g/dL (ref 30.0–36.0)
MCV: 107.5 fL — ABNORMAL HIGH (ref 80.0–100.0)
Platelets: 158 10*3/uL (ref 150–400)
RBC: 4 MIL/uL — ABNORMAL LOW (ref 4.22–5.81)
RDW: 12.9 % (ref 11.5–15.5)
WBC: 12.3 10*3/uL — ABNORMAL HIGH (ref 4.0–10.5)
nRBC: 0 % (ref 0.0–0.2)

## 2023-04-20 LAB — TROPONIN I (HIGH SENSITIVITY): Troponin I (High Sensitivity): 94 ng/L — ABNORMAL HIGH (ref <18)

## 2023-04-20 LAB — T4, FREE: Free T4: 1.05 ng/dL (ref 0.61–1.12)

## 2023-04-20 LAB — MAGNESIUM: Magnesium: 2.2 mg/dL (ref 1.7–2.4)

## 2023-04-20 LAB — TSH: TSH: 2.997 u[IU]/mL (ref 0.350–4.500)

## 2023-04-20 MED ORDER — AMIODARONE HCL 200 MG PO TABS
400.0000 mg | ORAL_TABLET | Freq: Two times a day (BID) | ORAL | Status: DC
  Administered 2023-04-21 – 2023-04-25 (×9): 400 mg via ORAL
  Filled 2023-04-20 (×9): qty 2

## 2023-04-20 MED ORDER — MIDODRINE HCL 5 MG PO TABS: 5.0000 mg | ORAL_TABLET | Freq: Once | ORAL | Status: DC

## 2023-04-20 MED ORDER — ENOXAPARIN SODIUM 40 MG/0.4ML IJ SOSY
40.0000 mg | PREFILLED_SYRINGE | INTRAMUSCULAR | Status: DC
  Administered 2023-04-20 – 2023-04-24 (×4): 40 mg via SUBCUTANEOUS
  Filled 2023-04-20 (×4): qty 0.4

## 2023-04-20 MED ORDER — AMIODARONE HCL 200 MG PO TABS: 400.0000 mg | ORAL_TABLET | Freq: Every day | ORAL | Status: DC

## 2023-04-20 MED ORDER — LORATADINE 10 MG PO TABS
10.0000 mg | ORAL_TABLET | Freq: Every day | ORAL | Status: DC
  Administered 2023-04-21 – 2023-04-25 (×5): 10 mg via ORAL
  Filled 2023-04-20 (×5): qty 1

## 2023-04-20 MED ORDER — ACETAMINOPHEN 325 MG PO TABS
650.0000 mg | ORAL_TABLET | Freq: Four times a day (QID) | ORAL | Status: DC | PRN
  Administered 2023-04-22: 650 mg via ORAL
  Filled 2023-04-20 (×3): qty 2

## 2023-04-20 MED ORDER — AMIODARONE HCL 200 MG PO TABS: 200.0000 mg | ORAL_TABLET | Freq: Every day | ORAL | Status: DC

## 2023-04-20 MED ORDER — METOPROLOL SUCCINATE ER 25 MG PO TB24
12.5000 mg | ORAL_TABLET | Freq: Every day | ORAL | Status: DC
  Administered 2023-04-21 – 2023-04-25 (×5): 12.5 mg via ORAL
  Filled 2023-04-20 (×5): qty 1

## 2023-04-20 MED ORDER — MAGNESIUM HYDROXIDE 400 MG/5ML PO SUSP: 30.0000 mL | Freq: Every day | ORAL | Status: DC | PRN

## 2023-04-20 MED ORDER — MIDODRINE HCL 5 MG PO TABS: 5.0000 mg | ORAL_TABLET | Freq: Three times a day (TID) | ORAL | Status: DC

## 2023-04-20 MED ORDER — GABAPENTIN 300 MG PO CAPS
300.0000 mg | ORAL_CAPSULE | Freq: Two times a day (BID) | ORAL | Status: DC
  Administered 2023-04-21 – 2023-04-25 (×9): 300 mg via ORAL
  Filled 2023-04-20 (×9): qty 1

## 2023-04-20 MED ORDER — ORAL CARE MOUTH RINSE: 15.0000 mL | OROMUCOSAL | Status: DC | PRN

## 2023-04-20 MED ORDER — TRAZODONE HCL 50 MG PO TABS
25.0000 mg | ORAL_TABLET | Freq: Every evening | ORAL | Status: DC | PRN
  Administered 2023-04-20: 25 mg via ORAL
  Filled 2023-04-20: qty 1

## 2023-04-20 MED ORDER — ONDANSETRON HCL 4 MG PO TABS: 4.0000 mg | ORAL_TABLET | Freq: Four times a day (QID) | ORAL | Status: DC | PRN

## 2023-04-20 MED ORDER — FUROSEMIDE 10 MG/ML IJ SOLN
40.0000 mg | Freq: Once | INTRAMUSCULAR | Status: AC
  Administered 2023-04-20: 40 mg via INTRAVENOUS
  Filled 2023-04-20: qty 4

## 2023-04-20 MED ORDER — BRIMONIDINE TARTRATE 0.2 % OP SOLN
1.0000 [drp] | Freq: Two times a day (BID) | OPHTHALMIC | Status: DC
  Administered 2023-04-21 – 2023-04-25 (×8): 1 [drp] via OPHTHALMIC
  Filled 2023-04-20 (×5): qty 5

## 2023-04-20 MED ORDER — ALBUTEROL SULFATE (2.5 MG/3ML) 0.083% IN NEBU: 2.5000 mg | INHALATION_SOLUTION | Freq: Four times a day (QID) | RESPIRATORY_TRACT | Status: DC | PRN

## 2023-04-20 MED ORDER — ACETAMINOPHEN 650 MG RE SUPP: 650.0000 mg | Freq: Four times a day (QID) | RECTAL | Status: DC | PRN

## 2023-04-20 MED ORDER — FUROSEMIDE 10 MG/ML IJ SOLN
40.0000 mg | Freq: Two times a day (BID) | INTRAMUSCULAR | Status: DC
  Administered 2023-04-21 – 2023-04-22 (×4): 40 mg via INTRAVENOUS
  Filled 2023-04-20 (×4): qty 4

## 2023-04-20 MED ORDER — ONDANSETRON HCL 4 MG/2ML IJ SOLN: 4.0000 mg | Freq: Four times a day (QID) | INTRAMUSCULAR | Status: DC | PRN

## 2023-04-20 MED ORDER — ASPIRIN 81 MG PO TBEC
81.0000 mg | DELAYED_RELEASE_TABLET | Freq: Every day | ORAL | Status: DC
  Administered 2023-04-21 – 2023-04-25 (×5): 81 mg via ORAL
  Filled 2023-04-20 (×5): qty 1

## 2023-04-20 NOTE — ED Notes (Signed)
Pt taken to floor by secondary rn.

## 2023-04-20 NOTE — ED Provider Notes (Signed)
[facility] Provider Note    Event Date/Time   First MD Initiated Contact with Patient 04/20/23 1714     (approximate)   History   Shortness of Breath  AICD is a [employer] HPI  [name] is a 89 y.o. male past medical history significant for CHF, AICD, atrial fibrillation, CAD, pulmonary fibrosis on chronic 5 L of oxygen at home, history of ventricular tachycardia on amiodarone, AAA, diabetes, hypertension, who presents to the emergency department with shortness of breath.  Patient states that he was recently in the hospital 3 days ago.  Today had significantly worsening shortness of breath.  States that he was unable to catch his breath even while on his home oxygen.  States that since he has been put on a nonrebreather felt much better.  Placed on 6 L nasal cannula upon arrival to the emergency department.  Endorses upper abdominal pain.  Denies any significant chest pain.  Denies nausea, vomiting or diarrhea.  Denies any blood in his stool.  Denies falls or trauma.  Has not missed any of his home medications.  Patient saw arrived, [name], able to provide the history that patient has a [employer]tr[employer] pacemaker.  Stated that he they discontinued his Lasix whenever they started him on amiodarone.  Today while on his 6 L nasal cannula was gasping for breath and having significant shortness of breath.  When EMS arrived he was 70% on his 6 L.     Physical Exam   Triage Vital Signs: ED Triage Vitals  Encounter Vitals Group     BP 04/20/23 1720 116/64     Systolic BP Percentile --      Diastolic BP Percentile --      Pulse Rate 04/20/23 1720 78     Resp 04/20/23 1720 (!) 28     Temp 04/20/23 1720 97.6 F (36.4 C)     Temp Source 04/20/23 1720 Oral     SpO2 04/20/23 1720 95 %     Weight 04/20/23 1727 157 lb 13.6 oz (71.6 kg)     Height 04/20/23 1727 5\' 11"  (1.803 m)     Head Circumference --      Peak Flow --      Pain Score 04/20/23 1727 0     Pain Loc  --      Pain Education --      Exclude from Growth Chart --     Most recent vital signs: Vitals:   04/20/23 1720  BP: 116/64  Pulse: 78  Resp: (!) 28  Temp: 97.6 F (36.4 C)  SpO2: 95%    Physical Exam Constitutional:      Appearance: He is well-developed.  HENT:     Head: Atraumatic.  Eyes:     Conjunctiva/sclera: Conjunctivae normal.  Cardiovascular:     Rate and Rhythm: Regular rhythm.  Pulmonary:     Effort: Tachypnea present. No respiratory distress.     Comments: 6 L nasal cannula.  Crackles to bilateral lower lung fields. Chest:     Chest wall: No tenderness.  Abdominal:     Tenderness: There is no abdominal tenderness.  Musculoskeletal:     Cervical back: Normal range of motion.     Right lower leg: No edema.     Left lower leg: No edema.  Skin:    General: Skin is warm.     Capillary Refill: Capillary refill takes less than 2 seconds.  Neurological:     Mental  Status: He is alert. Mental status is at baseline.     IMPRESSION / MDM / ASSESSMENT AND PLAN / ED COURSE  I reviewed the triage vital signs and the nursing notes.  Differential diagnosis including dysrhythmia, anemia, pulmonary edema, progression of pulmonary fibrosis, ACS  EKG  I, Corena Herter, the attending physician, personally viewed and interpreted this ECG.   Rate: Normal  Rhythm: Normal sinus  Intervals: Prolonged PR  ST&T Change: None Underlying bundle branch block.  No significant change when compared to prior EKG.  No tachycardic or bradycardic dysrhythmias while on cardiac telemetry.  RADIOLOGY I independently reviewed imaging, my interpretation of imaging: Chest x-ray with significant increase of interstitial markings when compared to recent chest x-ray done.  On my evaluation more concerning for pulmonary edema.  Read as pulmonary edema versus multilobar pneumonia.  Favored to be pulmonary edema.  LABS (all labs ordered are listed, but only abnormal results are  displayed) Labs interpreted as -    Labs Reviewed  CBC - Abnormal; Notable for the following components:      Result Value   WBC 12.3 (*)    RBC 4.00 (*)    MCV 107.5 (*)    All other components within normal limits  COMPREHENSIVE METABOLIC PANEL - Abnormal; Notable for the following components:   Glucose, Bld 284 (*)    BUN 32 (*)    Creatinine, Ser 1.40 (*)    Total Protein 8.7 (*)    GFR, Estimated 47 (*)    All other components within normal limits  BRAIN NATRIURETIC PEPTIDE - Abnormal; Notable for the following components:   B Natriuretic Peptide 1,117.9 (*)    All other components within normal limits  TROPONIN I (HIGH SENSITIVITY) - Abnormal; Notable for the following components:   Troponin I (High Sensitivity) 94 (*)    All other components within normal limits  TSH  T4, FREE  MAGNESIUM     MDM  Clinical picture is most concerning for acute on chronic heart failure exacerbation given discontinuation of his Lasix.  Concern for flash pulmonary edema that caused acute hypoxia earlier today.  Patient was given IV Lasix.  Is now on 6 L nasal cannula.  Chest x-ray concerning for pulmonary edema with significantly elevated BNP.  Troponin is elevated but appears to be at his baseline.  Mild leukocytosis but otherwise does not have any symptoms of pneumonia.  Consulted hospitalist for admission for acute on chronic heart failure exacerbation, acute on chronic hypoxic respiratory failure     PROCEDURES:  Critical Care performed: yes  .Critical Care  Performed by: Corena Herter, MD Authorized by: Corena Herter, MD   Critical care provider statement:    Critical care time (minutes):  45   Critical care time was exclusive of:  Separately billable procedures and treating other patients   Critical care was necessary to treat or prevent imminent or life-threatening deterioration of the following conditions:  Respiratory failure   Critical care was time spent personally by  me on the following activities:  Development of treatment plan with patient or surrogate, discussions with consultants, evaluation of patient's response to treatment, examination of patient, ordering and review of laboratory studies, ordering and review of radiographic studies, ordering and performing treatments and interventions, pulse oximetry, re-evaluation of patient's condition and review of old charts   Patient's presentation is most consistent with acute presentation with potential threat to life or bodily function.   MEDICATIONS ORDERED IN ED: Medications  furosemide (LASIX)  injection 40 mg (has no administration in time range)    FINAL CLINICAL IMPRESSION(S) / ED DIAGNOSES   Final diagnoses:  Hypoxia  Acute on chronic systolic heart failure (HCC)     Rx / DC Orders   ED Discharge Orders     None        Note:  This document was prepared using Dragon voice recognition software and may include unintentional dictation errors.   Corena Herter, MD 04/20/23 1910

## 2023-04-20 NOTE — Assessment & Plan Note (Signed)
-   We will continue amiodarone and aspirin. - It is unclear why she is not on Eliquis that was mentioned in July in her cardiology office notes.

## 2023-04-20 NOTE — ED Notes (Signed)
Informed RN Rose via chat/ Pt has bed assigned

## 2023-04-20 NOTE — Assessment & Plan Note (Deleted)
-   The patient will be on supplemental coverage with NovoLog. - We will continue Neurontin.

## 2023-04-20 NOTE — Assessment & Plan Note (Addendum)
-  The patient will be admitted to a cardiac telemetry bed. - We will continue diuresis with IV Lasix. - We Will follow serial troponins. - We will follow I's and O's and daily weights. - Cardiology consult will be obtained. - I notified Dr. Melton Alar about the patient.

## 2023-04-20 NOTE — Assessment & Plan Note (Signed)
-   The patient will be on supplemental coverage with NovoLog. - We will continue Neurontin.

## 2023-04-20 NOTE — ED Triage Notes (Signed)
Pt brought in from home by EMS for SOB. Pt states he began experiencing increased shortness of breath 2-3 days ago that worsened today. Pt states he is normally on 5L O2. Per EMS, when fire dept arrived to patient's home today, ptr was satting 70% on his normal 5L. Fire dept placed pt on NRB and patients sats increased to 99-100%. Upon arrival to ED, pt was taken off NRB and placed on 6L O2. O2 sats remained 95-97%.

## 2023-04-20 NOTE — Assessment & Plan Note (Signed)
-   We will continue amiodarone.

## 2023-04-20 NOTE — H&P (Signed)
PATIENT NAME: [name]    MR#:  [id number]  DATE OF BIRTH:  1928-10-17  DATE OF ADMISSION:  04/20/2023  PRIMARY CARE PHYSICIAN: Myrene Buddy, NP   Patient is coming from: Home  REQUESTING/REFERRING PHYSICIAN: Corena Herter, MD  CHIEF COMPLAINT:   Chief Complaint  Patient presents with   Shortness of Breath    HISTORY OF PRESENT ILLNESS:  [name] is a 89 y.o. male with medical history significant for coronary artery disease, CHF, type 2 diabetes mellitus and peripheral neuropathy, stage IIIa CKD, interstitial pulmonary fibrosis on chronic home O2 at 5 L/min, history of ventricular tachycardia on amiodarone, status post AICD, and atrial fibrillation on Eliquis, who presented to the emergency room with acute onset of dyspnea.  She was recently hospitalized for 3 days with worsening dyspnea.  Today she desatted to 70% on 6 L of O2 by nasal cannula and required 100% nonrebreather and by the time she came to the ER she was placed on 6 L of O2 by nasal cannula.  She denies any chest pain or palpitations.  No nausea or vomiting or abdominal pain.  No bleeding diathesis.  She has not missed any of her medications.  She had her Lasix recently discontinued when they started her on amiodarone.  No fever or chills.  ED Course: When she came to the ER, respiratory to 28 and pulse symmetry 95% on 6 L of O2 by nasal cannula with otherwise normal vital signs.  Labs revealed a blood Kos of 284 and BUN of 32 with a creatinine 1.4 and BNP was 1117.9 with high sensitive troponin I of 94.  CBC showed leukocytosis 12.3.  TSH was 2.99 and free T41.05. EKG as reviewed by me : EKG showed normal sinus rhythm with rate of 69 with prolonged PR interval and right bundle branch block with anterolateral Q waves. Imaging: Two-view chest x-ray showed increased interstitial markings throughout bilateral lungs in keeping with pulmonary edema with differential including  multilobar pneumonia and ARDS.  Most recent 2D echo on 4//2024 revealed an EF of 30% per office records at [facility] clinic with mild aortic regurgitation.  The patient was given 40 mg of IV Lasix and 10 mg of p.o. midodrine.  She will be admitted to a progressive unit bed for further evaluation and management.   PAST MEDICAL HISTORY:   Past Medical History:  Diagnosis Date   CAD (coronary artery disease)    Cardiac defibrillator in place 01/2020   CHF (congestive heart failure) (HCC)    Diabetes mellitus without complication (HCC)    History of myocardial infarction 01/2020   Peripheral neuropathy    Restless leg syndrome     PAST SURGICAL HISTORY:   Past Surgical History:  Procedure Laterality Date   APPENDECTOMY     CARDIAC DEFIBRILLATOR PLACEMENT Left 01/2020   PARTIAL KNEE ARTHROPLASTY Left     SOCIAL HISTORY:   Social History   Tobacco Use   Smoking status: Never   Smokeless tobacco: Never  Substance Use Topics   Alcohol use: Not Currently    FAMILY HISTORY:  History reviewed. No pertinent family history.  DRUG ALLERGIES:   Allergies  Allergen Reactions   Sulfamethoxazole-Trimethoprim Other (See Comments)    REVIEW OF SYSTEMS:   ROS As per history of present illness. All pertinent systems were reviewed above. Constitutional, HEENT, cardiovascular, respiratory, GI, GU, musculoskeletal, neuro, psychiatric, endocrine, integumentary and hematologic systems were reviewed and are  otherwise negative/unremarkable except for positive findings mentioned above in the HPI.   MEDICATIONS AT HOME:   Prior to Admission medications   Medication Sig Start Date End Date Taking? Authorizing Provider  acetaminophen (TYLENOL) 500 MG tablet Take 1 tablet (500 mg total) by mouth daily as needed. 04/17/23   Alford Highland, MD  albuterol (VENTOLIN HFA) 108 (90 Base) MCG/ACT inhaler Inhale 2 puffs into the lungs every 6 (six) hours as needed for wheezing or shortness of  breath. Use 2 times daily x5 days, then every 6 hours as needed. Patient not taking: Reported on 04/16/2023 10/27/20   Rodolph Bong, MD  amiodarone (PACERONE) 200 MG tablet Take 2 tablets (400 mg total) by mouth 2 (two) times daily for 9 days, THEN 2 tablets (400 mg total) daily for 10 days, THEN 1 tablet (200 mg total) daily for 11 days. 04/17/23 05/17/23  Alford Highland, MD  aspirin 81 MG EC tablet Take 81 mg by mouth daily.    [provider]  brimonidine (ALPHAGAN) 0.2 % ophthalmic solution Place 1 drop into both eyes 2 (two) times daily. 03/04/23   [provider]  cetirizine (ZYRTEC) 10 MG tablet Take 10 mg by mouth daily.    [provider]  gabapentin (NEURONTIN) 300 MG capsule Take 300 mg by mouth 2 (two) times daily. 07/06/20   [provider]  metoprolol succinate (TOPROL-XL) 25 MG 24 hr tablet Take 12.5 mg by mouth daily. 08/11/20   [provider]  midodrine (PROAMATINE) 5 MG tablet Take 1 tablet (5 mg total) by mouth 3 (three) times daily with meals. 04/17/23   Alford Highland, MD      VITAL SIGNS:  Blood pressure 123/67, pulse 76, temperature 97.8 F (36.6 C), temperature source Oral, resp. rate (!) 24, height 5\' 11"  (1.803 m), weight 71.6 kg, SpO2 98%.  PHYSICAL EXAMINATION:  Physical Exam  GENERAL:  89 y.o.-year-old patient lying in the bed with no acute distress.  EYES: Pupils equal, round, reactive to light and accommodation. No scleral icterus. Extraocular muscles intact.  HEENT: Head atraumatic, normocephalic. Oropharynx and nasopharynx clear.  NECK:  Supple, no jugular venous distention. No thyroid enlargement, no tenderness.  LUNGS: Diminished bibasal breath sounds with bibasal rales.  No use of accessory muscles of respiration.  CARDIOVASCULAR: Regular rate and rhythm, S1, S2 normal. No murmurs, rubs, or gallops.  ABDOMEN: Soft, nondistended, nontender. Bowel sounds present. No organomegaly or mass.  EXTREMITIES: No  pedal edema, cyanosis, or clubbing.  NEUROLOGIC: Cranial nerves II through XII are intact. Muscle strength 5/5 in all extremities. Sensation intact. Gait not checked.  PSYCHIATRIC: The patient is alert and oriented x 3.  Normal affect and good eye contact. SKIN: No obvious rash, lesion, or ulcer.   LABORATORY PANEL:   CBC Recent Labs  Lab 04/20/23 1813  WBC 12.3*  HGB 13.4  HCT 43.0  PLT 158   ------------------------------------------------------------------------------------------------------------------  Chemistries  Recent Labs  Lab 04/20/23 1813  NA 137  K 5.0  CL 100  CO2 25  GLUCOSE 284*  BUN 32*  CREATININE 1.40*  CALCIUM 9.2  MG 2.2  AST 26  ALT 17  ALKPHOS 98  BILITOT 0.9   ------------------------------------------------------------------------------------------------------------------  Cardiac Enzymes No results for input(s): "TROPONINI" in the last 168 hours. ------------------------------------------------------------------------------------------------------------------  RADIOLOGY:  DG Chest 2 View  Result Date: 04/20/2023 CLINICAL DATA:  Shortness of breath. EXAM: CHEST - 2 VIEW COMPARISON:  04/15/2023. FINDINGS: Since the prior study, there are increased interstitial markings  throughout bilateral lungs. No dense consolidation or lung collapse. Bilateral costophrenic angles are clear. Stable cardio-mediastinal silhouette. There is a left sided 2-lead pacemaker. No acute osseous abnormalities. The soft tissues are within normal limits. IMPRESSION: 1. Increased interstitial markings throughout bilateral lungs, most in keeping with pulmonary edema. Differential diagnosis also includes ARDS and multilobar pneumonia, which are less likely in the given clinical context. Electronically Signed   By: Jules Schick M.D.   On: 04/20/2023 18:46      IMPRESSION AND PLAN:  Assessment and Plan: * Acute on chronic systolic (congestive) heart failure (HCC)  -The  patient will be admitted to a cardiac telemetry bed. - We will continue diuresis with IV Lasix. - We Will follow serial troponins. - We will follow I's and O's and daily weights. - Cardiology consult will be obtained. - I notified Dr. Melton Alar about the patient.   Paroxysmal atrial fibrillation (HCC) - We will continue amiodarone and aspirin. - It is unclear why she is not on Eliquis that was mentioned in July in her cardiology office notes.  History of ventricular tachycardia - We will continue amiodarone.  Type 2 diabetes mellitus with peripheral neuropathy (HCC) - The patient will be on supplemental coverage with NovoLog. - We will continue Neurontin.   DVT prophylaxis: Lovenox.  Advanced Care Planning:  Code Status: full code.  Family Communication:  The plan of care was discussed in details with the patient (and family). I answered all questions. The patient agreed to proceed with the above mentioned plan. Further management will depend upon hospital course. Disposition Plan: Back to previous home environment Consults called: Cardiology All the records are reviewed and case discussed with ED provider.  Status is: Observation  I certify that at the time of admission, it is my clinical judgment that the patient will require hospital care extending less than 2 midnights.                            Dispo: The patient is from: Home              Anticipated d/c is to: Home              Patient currently is not medically stable to d/c.              Difficult to place patient: No  Hannah Beat M.D on 04/20/2023 at 10:01 PM  [facility]   From 7 PM-7 AM, contact night-coverage [email or web address]  CC: Primary care physician; Gauger, Hermenia Fiscal, NP

## 2023-04-21 DIAGNOSIS — I252 Old myocardial infarction: Secondary | ICD-10-CM | POA: Diagnosis not present

## 2023-04-21 DIAGNOSIS — N179 Acute kidney failure, unspecified: Secondary | ICD-10-CM | POA: Diagnosis present

## 2023-04-21 DIAGNOSIS — R0602 Shortness of breath: Secondary | ICD-10-CM | POA: Diagnosis present

## 2023-04-21 DIAGNOSIS — I5023 Acute on chronic systolic (congestive) heart failure: Secondary | ICD-10-CM | POA: Diagnosis present

## 2023-04-21 DIAGNOSIS — Z79899 Other long term (current) drug therapy: Secondary | ICD-10-CM | POA: Diagnosis not present

## 2023-04-21 DIAGNOSIS — Z95810 Presence of automatic (implantable) cardiac defibrillator: Secondary | ICD-10-CM | POA: Diagnosis not present

## 2023-04-21 DIAGNOSIS — I130 Hypertensive heart and chronic kidney disease with heart failure and stage 1 through stage 4 chronic kidney disease, or unspecified chronic kidney disease: Secondary | ICD-10-CM | POA: Diagnosis present

## 2023-04-21 DIAGNOSIS — I714 Abdominal aortic aneurysm, without rupture, unspecified: Secondary | ICD-10-CM | POA: Diagnosis present

## 2023-04-21 DIAGNOSIS — G2581 Restless legs syndrome: Secondary | ICD-10-CM | POA: Diagnosis present

## 2023-04-21 DIAGNOSIS — I472 Ventricular tachycardia, unspecified: Secondary | ICD-10-CM | POA: Diagnosis present

## 2023-04-21 DIAGNOSIS — E785 Hyperlipidemia, unspecified: Secondary | ICD-10-CM | POA: Diagnosis present

## 2023-04-21 DIAGNOSIS — Z7901 Long term (current) use of anticoagulants: Secondary | ICD-10-CM | POA: Diagnosis not present

## 2023-04-21 DIAGNOSIS — E1122 Type 2 diabetes mellitus with diabetic chronic kidney disease: Secondary | ICD-10-CM | POA: Diagnosis present

## 2023-04-21 DIAGNOSIS — Z9359 Other cystostomy status: Secondary | ICD-10-CM | POA: Diagnosis not present

## 2023-04-21 DIAGNOSIS — I480 Paroxysmal atrial fibrillation: Secondary | ICD-10-CM | POA: Diagnosis present

## 2023-04-21 DIAGNOSIS — Z9981 Dependence on supplemental oxygen: Secondary | ICD-10-CM | POA: Diagnosis not present

## 2023-04-21 DIAGNOSIS — Z66 Do not resuscitate: Secondary | ICD-10-CM | POA: Diagnosis present

## 2023-04-21 DIAGNOSIS — J9621 Acute and chronic respiratory failure with hypoxia: Secondary | ICD-10-CM | POA: Diagnosis present

## 2023-04-21 DIAGNOSIS — R5381 Other malaise: Secondary | ICD-10-CM | POA: Diagnosis present

## 2023-04-21 DIAGNOSIS — J8410 Pulmonary fibrosis, unspecified: Secondary | ICD-10-CM | POA: Diagnosis present

## 2023-04-21 DIAGNOSIS — N1831 Chronic kidney disease, stage 3a: Secondary | ICD-10-CM | POA: Diagnosis present

## 2023-04-21 DIAGNOSIS — I6523 Occlusion and stenosis of bilateral carotid arteries: Secondary | ICD-10-CM | POA: Diagnosis present

## 2023-04-21 DIAGNOSIS — E1142 Type 2 diabetes mellitus with diabetic polyneuropathy: Secondary | ICD-10-CM | POA: Diagnosis present

## 2023-04-21 DIAGNOSIS — I2510 Atherosclerotic heart disease of native coronary artery without angina pectoris: Secondary | ICD-10-CM | POA: Diagnosis present

## 2023-04-21 DIAGNOSIS — D72829 Elevated white blood cell count, unspecified: Secondary | ICD-10-CM | POA: Diagnosis present

## 2023-04-21 LAB — CBC
HCT: 40.6 % (ref 39.0–52.0)
Hemoglobin: 13.2 g/dL (ref 13.0–17.0)
MCH: 33.9 pg (ref 26.0–34.0)
MCHC: 32.5 g/dL (ref 30.0–36.0)
MCV: 104.4 fL — ABNORMAL HIGH (ref 80.0–100.0)
Platelets: 160 10*3/uL (ref 150–400)
RBC: 3.89 MIL/uL — ABNORMAL LOW (ref 4.22–5.81)
RDW: 12.9 % (ref 11.5–15.5)
WBC: 12.2 10*3/uL — ABNORMAL HIGH (ref 4.0–10.5)
nRBC: 0 % (ref 0.0–0.2)

## 2023-04-21 LAB — BASIC METABOLIC PANEL
Anion gap: 10 (ref 5–15)
BUN: 30 mg/dL — ABNORMAL HIGH (ref 8–23)
CO2: 27 mmol/L (ref 22–32)
Calcium: 8.8 mg/dL — ABNORMAL LOW (ref 8.9–10.3)
Chloride: 101 mmol/L (ref 98–111)
Creatinine, Ser: 1.31 mg/dL — ABNORMAL HIGH (ref 0.61–1.24)
GFR, Estimated: 51 mL/min — ABNORMAL LOW (ref >60)
Glucose, Bld: 204 mg/dL — ABNORMAL HIGH (ref 70–99)
Potassium: 3.9 mmol/L (ref 3.5–5.1)
Sodium: 138 mmol/L (ref 135–145)

## 2023-04-21 MED ORDER — DIPHENHYDRAMINE HCL 25 MG PO CAPS
25.0000 mg | ORAL_CAPSULE | Freq: Every evening | ORAL | Status: DC | PRN
  Administered 2023-04-21 – 2023-04-23 (×3): 25 mg via ORAL
  Filled 2023-04-21 (×3): qty 1

## 2023-04-21 MED ORDER — BENZONATATE 100 MG PO CAPS
200.0000 mg | ORAL_CAPSULE | Freq: Three times a day (TID) | ORAL | Status: DC
  Administered 2023-04-21 – 2023-04-25 (×14): 200 mg via ORAL
  Filled 2023-04-21 (×14): qty 2

## 2023-04-21 NOTE — Consult Note (Signed)
[facility] CARDIOLOGY CONSULT NOTE       Patient ID: [name] MRN: [id number] DOB/AGE: [birth year] 89 y.o.  Admit date: 04/20/2023 Referring Physician Dr Arville Care Primary Physician Jeremy Oms, NP Primary Cardiologist Dr. Dorothyann Peng Reason for Consultation AECHF  HPI: [name] is a 89 y.o. male  with a past medical history of coronary artery disease, atrial fibrillation, chronic systolic CHF, ventricular tachycardia, AICD in place, hyperlipidemia, type 2 diabetes, bilateral carotid artery stenosis, AAA  who presented to the ED with acute on chronic HFrEF. Cardiology was consulted for further evaluation.   Patient was recently hospitalized for VT s/p ICD discharge just a few days ago. His breathing has improved since he has gotten to the hospital. Patient mainly complains of weakness. Denies chest pain, shortness of breath, palpitations, diaphoresis, syncope, edema, PND, orthopnea.   Review of systems complete and found to be negative unless listed above    Past Medical History:  Diagnosis Date   CAD (coronary artery disease)    Cardiac defibrillator in place 01/2020   CHF (congestive heart failure) (HCC)    Diabetes mellitus without complication (HCC)    History of myocardial infarction 01/2020   Peripheral neuropathy    Restless leg syndrome     Past Surgical History:  Procedure Laterality Date   APPENDECTOMY     CARDIAC DEFIBRILLATOR PLACEMENT Left 01/2020   PARTIAL KNEE ARTHROPLASTY Left     Medications Prior to Admission  Medication Sig Dispense Refill Last Dose   acetaminophen (TYLENOL) 500 MG tablet Take 1 tablet (500 mg total) by mouth daily as needed.   unk   amiodarone (PACERONE) 200 MG tablet Take 2 tablets (400 mg total) by mouth 2 (two) times daily for 9 days, THEN 2 tablets (400 mg total) daily for 10 days, THEN 1 tablet (200 mg total) daily for 11 days. 67 tablet 0 04/20/2023   aspirin 81 MG EC tablet Take 81 mg by mouth daily.    04/20/2023   brimonidine (ALPHAGAN) 0.2 % ophthalmic solution Place 1 drop into both eyes 2 (two) times daily.   04/20/2023   cetirizine (ZYRTEC) 10 MG tablet Take 10 mg by mouth daily.   04/20/2023   gabapentin (NEURONTIN) 300 MG capsule Take 300 mg by mouth 2 (two) times daily.   04/20/2023   metoprolol succinate (TOPROL-XL) 25 MG 24 hr tablet Take 12.5 mg by mouth daily.   04/20/2023   midodrine (PROAMATINE) 5 MG tablet Take 1 tablet (5 mg total) by mouth 3 (three) times daily with meals. 90 tablet 0 04/20/2023   albuterol (VENTOLIN HFA) 108 (90 Base) MCG/ACT inhaler Inhale 2 puffs into the lungs every 6 (six) hours as needed for wheezing or shortness of breath. Use 2 times daily x5 days, then every 6 hours as needed. (Patient not taking: Reported on 04/16/2023) 8 g 0 Not Taking   Social History   Socioeconomic History   Marital status: Married    Spouse name: Not on file   Number of children: Not on file   Years of education: Not on file   Highest education level: Not on file  Occupational History   Not on file  Tobacco Use   Smoking status: Never   Smokeless tobacco: Never  Vaping Use   Vaping status: Never Used  Substance and Sexual Activity   Alcohol use: Not Currently   Drug use: Not Currently   Sexual activity: Not on file  Other Topics Concern   Not on  file  Social History Narrative   Not on file   Social Determinants of Health   Financial Resource Strain: Low Risk  (10/05/2022)   Received from [facility], [facility]   Overall Financial Resource Strain (CARDIA)    Difficulty of Paying Living Expenses: Not hard at all  Food Insecurity: No Food Insecurity (04/20/2023)   Hunger Vital Sign    Worried About Running Out of Food in the Last Year: Never true    Ran Out of Food in the Last Year: Never true  Transportation Needs: No Transportation Needs (04/20/2023)   PRAPARE - Administrator, Civil Service (Medical): No    Lack of Transportation  (Non-Medical): No  Physical Activity: Not on file  Stress: Not on file  Social Connections: Not on file  Intimate Partner Violence: Not At Risk (04/20/2023)   Humiliation, Afraid, Rape, and Kick questionnaire    Fear of Current or Ex-Partner: No    Emotionally Abused: No    Physically Abused: No    Sexually Abused: No    History reviewed. No pertinent family history.   Vitals:   04/21/23 0500 04/21/23 0623 04/21/23 0750 04/21/23 1138  BP:  (!) 141/71 (!) 144/95 129/67  Pulse:  75    Resp:  17    Temp:   98.1 F (36.7 C) 98 F (36.7 C)  TempSrc:   Oral Oral  SpO2:  100% 99% 100%  Weight: 69.8 kg     Height:        PHYSICAL EXAM General: Chronically ill-appearing, well nourished, in no acute distress. HEENT: Normocephalic and atraumatic. Neck: No JVD.  Lungs: Normal respiratory effort. Coarse bilaterally to auscultation. +wheezes. No crackles, rhonchi.  Heart: HRRR. Normal S1 and S2 without gallops or murmurs.  Abdomen: Non-distended appearing.  Msk: Normal strength and tone for age. Extremities: Warm and well perfused. No clubbing, cyanosis.  No edema.  Neuro: Alert and oriented X 3. Psych: Answers questions appropriately.   Labs: Basic Metabolic Panel: Recent Labs    04/20/23 1813 04/21/23 0342  NA 137 138  K 5.0 3.9  CL 100 101  CO2 25 27  GLUCOSE 284* 204*  BUN 32* 30*  CREATININE 1.40* 1.31*  CALCIUM 9.2 8.8*  MG 2.2  --    Liver Function Tests: Recent Labs    04/20/23 1813  AST 26  ALT 17  ALKPHOS 98  BILITOT 0.9  PROT 8.7*  ALBUMIN 3.8   No results for input(s): "LIPASE", "AMYLASE" in the last 72 hours. CBC: Recent Labs    04/20/23 1813 04/21/23 0342  WBC 12.3* 12.2*  HGB 13.4 13.2  HCT 43.0 40.6  MCV 107.5* 104.4*  PLT 158 160   Cardiac Enzymes: Recent Labs    04/20/23 1813  TROPONINIHS 94*   BNP: Recent Labs    04/20/23 1813  BNP 1,117.9*   D-Dimer: No results for input(s): "DDIMER" in the last 72 hours. Hemoglobin  A1C: No results for input(s): "HGBA1C" in the last 72 hours.  Fasting Lipid Panel: No results for input(s): "CHOL", "HDL", "LDLCALC", "TRIG", "CHOLHDL", "LDLDIRECT" in the last 72 hours. Thyroid Function Tests: Recent Labs    04/20/23 1813  TSH 2.997   Anemia Panel: No results for input(s): "VITAMINB12", "FOLATE", "FERRITIN", "TIBC", "IRON", "RETICCTPCT" in the last 72 hours.   Radiology: DG Chest 2 View  Result Date: 04/20/2023 CLINICAL DATA:  Shortness of breath. EXAM: CHEST - 2 VIEW COMPARISON:  04/15/2023. FINDINGS: Since the prior  study, there are increased interstitial markings throughout bilateral lungs. No dense consolidation or lung collapse. Bilateral costophrenic angles are clear. Stable cardio-mediastinal silhouette. There is a left sided 2-lead pacemaker. No acute osseous abnormalities. The soft tissues are within normal limits. IMPRESSION: 1. Increased interstitial markings throughout bilateral lungs, most in keeping with pulmonary edema. Differential diagnosis also includes ARDS and multilobar pneumonia, which are less likely in the given clinical context. Electronically Signed   By: Jules Schick M.D.   On: 04/20/2023 18:46   DG Chest Port 1 View  Result Date: 04/16/2023 CLINICAL DATA:  Defibrillator shock EXAM: PORTABLE CHEST 1 VIEW COMPARISON:  Chest x-ray 04/24/2022 FINDINGS: The heart is enlarged, unchanged. Left-sided pacemaker is again seen. There central pulmonary vascular congestion. Right perihilar nodular density measuring 13 mm again noted. There scattered multifocal areas of interstitial opacities in the mid and lower lungs similar to the prior study. There is no new focal lung infiltrate, pleural effusion or pneumothorax. No acute fractures are seen. IMPRESSION: 1. Cardiomegaly with central pulmonary vascular congestion. 2. Unchanged right perihilar nodular density measuring 13 mm. 3. Stable chronic interstitial lung disease. Electronically Signed   By: Darliss Cheney M.D.   On: 04/16/2023 00:00    ECHO 10/2022: 1. The left ventricle is mildly dilated in size with normal wall thickness.    2. The left ventricular systolic function is severely decreased, LVEF is  visually estimated at 30%.    3. There is thinning and akinesis of the anteroapical and apical segments.    4. Mitral annular calcification is present (mild).    5. The mitral valve leaflets are mildly thickened with normal leaflet  mobility.   6. The aortic valve is probably trileaflet with mildly thickened leaflets  with mildly reduced excursion.    7. There is mild aortic regurgitation.    8. The right ventricle is normal in size, with normal systolic function.   TELEMETRY reviewed by me 04/21/2023: NSR with intermittent Afib  EKG reviewed by me: normal sinus rhythm with rate of 69 bpm,  right bundle branch block   Data reviewed by me 04/21/2023: last 24h vitals tele labs imaging I/O ED provider note, admission H&P  Principal Problem:   Acute on chronic systolic heart failure (HCC) Active Problems:   Type 2 diabetes mellitus with peripheral neuropathy (HCC)   History of ventricular tachycardia   Paroxysmal atrial fibrillation (HCC)    ASSESSMENT AND PLAN:  [name] is a 89 y.o. male  with a past medical history of coronary artery disease, atrial fibrillation, chronic systolic CHF, ventricular tachycardia, AICD in place, hyperlipidemia, type 2 diabetes, bilateral carotid artery stenosis, AAA  who presented to the ED with acute on chronic HFrEF. Cardiology was consulted for further evaluation.    # Acute on Chronic HFrEF With most recent EF 30% on echo 10/2022. Has been tolerating GDMT well. -Restart home cardiac meds as BP/Creatinine allow -BNP significantly elevated. Slight trop bump likely due to [facility]. -Continue with IV Lasix 40 mg BID -Strict I's and O's and daily weights.  -Maintain K>4 and Mag>2   # Hx of VT s/p ICD discharge Patient with ICD discharge x1  on 04/16/2023, apparent VT on device interrogation.  -Amiodarone 400 mg bid for 10 days, then taper.    # Paroxysmal atrial fibrillation Patient with hx of pAF, on metoprolol for rate control at home. Historically not anticoagulated given hx of bleeding and age. -Continue metoprolol succinate 12.5 mg daily for rate control.  Amiodarone as above. -Anticoagulation deferred historically.   # AKI  Cr elevated at 1.3 today (baseline ~1.0) Management per primary team   # Interstitial lung disease Patient with hx of ILD followed by pulmonology outpatient. On chronic 4-5L supplemental O2 at baseline.      Signed: Clotilde Dieter, DO  04/21/2023, 1:29 PM [facility] Cardiology

## 2023-04-21 NOTE — Plan of Care (Signed)

## 2023-04-21 NOTE — Progress Notes (Addendum)
Transition of Care [facility]) - Inpatient Brief Assessment   Patient Details  Name: RAYNEL MARINELARENA MRN: [id number] Date of Birth: 07-Feb-1929  Transition of Care [facility]) CM/SW Contact:    Bing Quarry, RN Phone Number: 04/21/2023, 4:51 PM   Clinical Narrative: 10/19: To ED from home 10/18 with SOB. Sig PMH per ED H&P for interstitial pulmonary fibrosis on chronic home O2 at 5 L/min, history of ventricular tachycardia on amiodarone, status post AICD, Afib on Eliquis. Has PCP. Has Insurance. No SODH flags at this time. TOC to follow for discharge oxygen needs and any other discharge planning needs.   Gabriel Cirri MSN RN CM  Transitions of Care Department [facility] [phone] Weekends Only     Transition of Care Asessment: Insurance and Status: Insurance coverage has been reviewed Patient has primary care physician: Yes Home environment has been reviewed: From home Prior level of function:: From home HTX interstitial pulmonary fibrosis on chronic home O2 at 5 L/min, history of ventricular tachycardia on amiodarone, status post AICD. Declined therapy evaluations today. Prior/Current Home Services: No current home services Social Determinants of Health Reivew: SDOH reviewed no interventions necessary Readmission risk has been reviewed: Yes Transition of care needs: transition of care needs identified, TOC will continue to follow

## 2023-04-21 NOTE — Progress Notes (Signed)
PT Cancellation Note  Patient Details Name: [name] MRN: [id number] DOB: 11-Mar-1929   Cancelled Treatment:     Pt did not complete evaluation and treat today because pt refused 2/2  fatigue and weakness. Pt stated " May be tomorrow. "   Janet Berlin PT DPT 3:35 PM,04/21/23

## 2023-04-21 NOTE — Progress Notes (Signed)
OT Cancellation Note  Patient Details Name: NOHL TRZCINSKI MRN: [id number] DOB: 07-30-1928   Cancelled Treatment:    Reason Eval/Treat Not Completed: Fatigue/lethargy limiting ability to participate (OT consult received, chart reviewed. Pt deferred all EOB/OOB mobility despite encouragement 2/2 fatigue. Will re-attempt evaluation next service date.)  Gerrie Nordmann 04/21/2023, 2:48 PM

## 2023-04-21 NOTE — Progress Notes (Signed)
PROGRESS NOTE    [name]  MVH:[id number] DOB: 1929-06-29 DOA: 04/20/2023 PCP: Myrene Buddy, NP    Brief Narrative:  89 y.o. Caucasian male with medical history significant for coronary artery disease, CHF, type 2 diabetes mellitus and peripheral neuropathy, stage IIIa CKD, interstitial pulmonary fibrosis on chronic home O2 at 5 L/min, history of ventricular tachycardia on amiodarone, status post AICD, and atrial fibrillation on Eliquis, who presented to the emergency room with acute onset of dyspnea with associated orthopnea and paroxysmal nocturnal dyspnea as well as dyspnea on exertion.  He admitted to mild dry cough without wheezing.  No lower extremity edema.  No chest pain or palpitations.  No fever or chills.Marland Kitchen  He was recently hospitalized for 3 days with worsening dyspnea.  Today she desatted to 70% on 6 L of O2 by nasal cannula and required 100% nonrebreather and by the time he came to the ER he was placed on 6 L of O2 by nasal cannula.  No nausea or vomiting or abdominal pain.  No bleeding diathesis.  He has not missed any of her medications.  She had her Lasix recently discontinued when they started her on amiodarone.  No fever or chills.   Assessment & Plan:   Principal Problem:   Acute on chronic systolic heart failure (HCC) Active Problems:   Paroxysmal atrial fibrillation (HCC)   Type 2 diabetes mellitus with peripheral neuropathy (HCC)   History of ventricular tachycardia   Acute on chronic systolic (congestive) heart failure (HCC) Patient has evidence of fluid overload Reports he was taken off his outpatient Lasix when amiodarone was initiated Plan: Continue IV Lasix 40 mg twice daily Target net -1-1.5 L daily Since and outs, daily weights Fluid restrict, low-sodium diet  Paroxysmal atrial fibrillation (HCC) Rate controlled currently Continue amiodarone and aspirin Not on anticoagulation for unclear reasons, presumably old age and fall risk  Acute  on chronic hypoxic respiratory failure Chronic interstitial lung disease Patient with baseline 5 L requirement Desaturated on 6 L down to 70% This has improved Plan: Diuresis as above Wean oxygen as tolerated   History of ventricular tachycardia PTA amiodarone   Type 2 diabetes mellitus with peripheral neuropathy (HCC) PTA Neurontin NovoLog SSI  DVT prophylaxis: SQ Lovenox Code Status: DNR Family Communication: None today Disposition Plan: Status is: Observation The patient will require care spanning > 2 midnights and should be moved to inpatient because: Acute decompensated heart failure on IV diuretics   Level of care: Progressive  Consultants:  Cardiology-[facility]  Procedures:  None  Antimicrobials: None   Subjective: Seen and examined resting in bed.  Reports symptomatic improvement since admission.  Objective: Vitals:   04/21/23 0500 04/21/23 0623 04/21/23 0750 04/21/23 1138  BP:  (!) 141/71 (!) 144/95 129/67  Pulse:  75    Resp:  17    Temp:   98.1 F (36.7 C) 98 F (36.7 C)  TempSrc:   Oral Oral  SpO2:  100% 99% 100%  Weight: 69.8 kg     Height:        Intake/Output Summary (Last 24 hours) at 04/21/2023 1321 Last data filed at 04/21/2023 1000 Gross per 24 hour  Intake 360 ml  Output 1500 ml  Net -1140 ml   Filed Weights   04/20/23 1727 04/21/23 0500  Weight: 71.6 kg 69.8 kg    Examination:  General exam: NAD Respiratory system: Scattered crackles bilaterally.  Normal work of breathing.  5 L Cardiovascular system: S1-S2, RRR, no  murmurs, 1+ pitting edema BLE Gastrointestinal system: Thin, soft, NT/ND, normal bowel sounds Central nervous system: Alert and oriented. No focal neurological deficits. Extremities: Symmetric 5 x 5 power. Skin: No rashes, lesions or ulcers Psychiatry: Judgement and insight appear normal. Mood & affect appropriate.     Data Reviewed: I have personally reviewed following labs and imaging studies  CBC: Recent Labs   Lab 04/15/23 2317 04/16/23 0735 04/20/23 1813 04/21/23 0342  WBC 9.5 10.0 12.3* 12.2*  NEUTROABS 5.5  --   --   --   HGB 13.3 13.0 13.4 13.2  HCT 42.0 41.4 43.0 40.6  MCV 107.1* 106.2* 107.5* 104.4*  PLT 164 159 158 160   Basic Metabolic Panel: Recent Labs  Lab 04/15/23 2317 04/17/23 0516 04/20/23 1813 04/21/23 0342  NA 137 135 137 138  K 4.3 4.5 5.0 3.9  CL 101 99 100 101  CO2 25 27 25 27   GLUCOSE 264* 176* 284* 204*  BUN 42* 38* 32* 30*  CREATININE 1.47* 1.42* 1.40* 1.31*  CALCIUM 8.7* 9.1 9.2 8.8*  MG 2.2  --  2.2  --    GFR: Estimated Creatinine Clearance: 35.5 mL/min (A) (by C-G formula based on SCr of 1.31 mg/dL (H)). Liver Function Tests: Recent Labs  Lab 04/15/23 2317 04/20/23 1813  AST 24 26  ALT 10 17  ALKPHOS 72 98  BILITOT 0.7 0.9  PROT 7.9 8.7*  ALBUMIN 3.6 3.8   No results for input(s): "LIPASE", "AMYLASE" in the last 168 hours. No results for input(s): "AMMONIA" in the last 168 hours. Coagulation Profile: No results for input(s): "INR", "PROTIME" in the last 168 hours. Cardiac Enzymes: No results for input(s): "CKTOTAL", "CKMB", "CKMBINDEX", "TROPONINI" in the last 168 hours. BNP (last 3 results) No results for input(s): "PROBNP" in the last 8760 hours. HbA1C: No results for input(s): "HGBA1C" in the last 72 hours. CBG: Recent Labs  Lab 04/16/23 0742 04/16/23 0746 04/16/23 1149 04/16/23 2153 04/17/23 0831  GLUCAP 61* 116* 216* 140* 193*   Lipid Profile: No results for input(s): "CHOL", "HDL", "LDLCALC", "TRIG", "CHOLHDL", "LDLDIRECT" in the last 72 hours. Thyroid Function Tests: Recent Labs    04/20/23 1813  TSH 2.997  FREET4 1.05   Anemia Panel: No results for input(s): "VITAMINB12", "FOLATE", "FERRITIN", "TIBC", "IRON", "RETICCTPCT" in the last 72 hours. Sepsis Labs: No results for input(s): "PROCALCITON", "LATICACIDVEN" in the last 168 hours.  No results found for this or any previous visit (from the past 240 hour(s)).        Radiology Studies: DG Chest 2 View  Result Date: 04/20/2023 CLINICAL DATA:  Shortness of breath. EXAM: CHEST - 2 VIEW COMPARISON:  04/15/2023. FINDINGS: Since the prior study, there are increased interstitial markings throughout bilateral lungs. No dense consolidation or lung collapse. Bilateral costophrenic angles are clear. Stable cardio-mediastinal silhouette. There is a left sided 2-lead pacemaker. No acute osseous abnormalities. The soft tissues are within normal limits. IMPRESSION: 1. Increased interstitial markings throughout bilateral lungs, most in keeping with pulmonary edema. Differential diagnosis also includes ARDS and multilobar pneumonia, which are less likely in the given clinical context. Electronically Signed   By: Jules Schick M.D.   On: 04/20/2023 18:46        Scheduled Meds:  amiodarone  400 mg Oral BID   Followed by   Melene Muller ON 04/26/2023] amiodarone  400 mg Oral Daily   Followed by   Melene Muller ON 05/06/2023] amiodarone  200 mg Oral Daily   aspirin EC  81 mg  Oral Daily   benzonatate  200 mg Oral TID   brimonidine  1 drop Both Eyes BID   enoxaparin (LOVENOX) injection  40 mg Subcutaneous Q24H   furosemide  40 mg Intravenous Q12H   gabapentin  300 mg Oral BID   loratadine  10 mg Oral Daily   metoprolol succinate  12.5 mg Oral Daily   midodrine  5 mg Oral Once   Continuous Infusions:   LOS: 0 days    Tresa Moore, MD [facility]   If 7PM-7AM, please contact night-coverage  04/21/2023, 1:22 PM

## 2023-04-22 DIAGNOSIS — I5023 Acute on chronic systolic (congestive) heart failure: Secondary | ICD-10-CM | POA: Diagnosis not present

## 2023-04-22 LAB — URINALYSIS, COMPLETE (UACMP) WITH MICROSCOPIC
Bilirubin Urine: NEGATIVE
Glucose, UA: NEGATIVE mg/dL
Ketones, ur: NEGATIVE mg/dL
Nitrite: NEGATIVE
Protein, ur: NEGATIVE mg/dL
Specific Gravity, Urine: 1.013 (ref 1.005–1.030)
WBC, UA: >50 WBC/hpf (ref 0–5)
pH: 5 (ref 5.0–8.0)

## 2023-04-22 LAB — BASIC METABOLIC PANEL
Anion gap: 11 (ref 5–15)
BUN: 33 mg/dL — ABNORMAL HIGH (ref 8–23)
CO2: 26 mmol/L (ref 22–32)
Calcium: 8.6 mg/dL — ABNORMAL LOW (ref 8.9–10.3)
Chloride: 101 mmol/L (ref 98–111)
Creatinine, Ser: 1.49 mg/dL — ABNORMAL HIGH (ref 0.61–1.24)
GFR, Estimated: 44 mL/min — ABNORMAL LOW (ref >60)
Glucose, Bld: 190 mg/dL — ABNORMAL HIGH (ref 70–99)
Potassium: 3.7 mmol/L (ref 3.5–5.1)
Sodium: 138 mmol/L (ref 135–145)

## 2023-04-22 LAB — MAGNESIUM: Magnesium: 2.1 mg/dL (ref 1.7–2.4)

## 2023-04-22 MED ORDER — MIDODRINE HCL 5 MG PO TABS
5.0000 mg | ORAL_TABLET | Freq: Three times a day (TID) | ORAL | Status: DC
  Administered 2023-04-22 – 2023-04-25 (×10): 5 mg via ORAL
  Filled 2023-04-22 (×10): qty 1

## 2023-04-22 NOTE — Evaluation (Signed)
Occupational Therapy Evaluation Patient Details Name: [name] MRN: [id number] DOB: 11-24-1928 Today's Date: 04/22/2023   History of Present Illness Pt is a 89 year old male presenting to the ED with cute onset of dyspnea with associated orthopnea and paroxysmal nocturnal dyspnea as well as dyspnea on exertion, admitted with Acute on chronic systolic (congestive) heart failure (HCC), Acute on chronic hypoxic respiratory failure    PMH significant for coronary artery disease, CHF, type 2 diabetes mellitus and peripheral neuropathy, stage IIIa CKD, interstitial pulmonary fibrosis on chronic home O2 at 5 L/min, history of ventricular tachycardia on amiodarone, status post AICD, and atrial fibrillation on Eliquis   Clinical Impression   Pt was seen for OT evaluation this date. PTA pt was MOD I-Indep with all ADL's, receiving assistance from son and daughter-in-law for IADLs prior to previous admission, since recent discharge, increased assist required for ADL/IADL from family.  Pt reports using walker in home and walker to get to WC. WC is used for prolonged mobility. Pt lives next to son, with his wife in one level home.   Pt presents to acute OT demonstrating impaired ADL performance, activity tolerance and functional mobility (See OT problem list for additional functional deficits).  Pt vitals pre mobility in seated supine: BP 113/57 (MAP 75), HR 71, spo2 100% 3L via [place]. Pt currently requires CGA for bed mobility. Pt completed LB dressing of donning personal shoes while in seated position. Demonstrating good dynamic sitting balance. STS at EOB; Pt required CGA and multiple verbal cues for upright posture to assist in breathing. In standing Pt Spo2 dropped to 80s while on 3L via [place] and remained there until supine sitting. Increased oxygen to 5L after consult with nurse. Pt spo2 in mid 90s at end of session, and Pt resting in semi supine. Pt was limited on this date per the above measurements, will  continue to assess ADL function and mobility at next available date. Pt would benefit from skilled OT services to address noted impairments and functional limitations (see below for any additional details) in order to maximize safety and independence while minimizing falls risk and caregiver burden. OT will follow acutely.       If plan is discharge home, recommend the following: Assistance with cooking/housework;Assist for transportation;A lot of help with bathing/dressing/bathroom;A lot of help with walking and/or transfers;Help with stairs or ramp for entrance    Functional Status Assessment  Patient has had a recent decline in their functional status and demonstrates the ability to make significant improvements in function in a reasonable and predictable amount of time.  Equipment Recommendations   (next venue of care)    Recommendations for Other Services       Precautions / Restrictions Precautions Precautions: Fall Precaution Comments: watch bp, spo2 Restrictions Weight Bearing Restrictions: No      Mobility Bed Mobility Overal bed mobility: Needs Assistance Bed Mobility: Sit to Supine, Supine to Sit     Supine to sit: Supervision Sit to supine: Supervision        Transfers Overall transfer level: Needs assistance Equipment used: Rolling walker (2 wheels) Transfers: Sit to/from Stand Sit to Stand: Supervision           General transfer comment: Pt ADL participation limited due to desat when standing      Balance Overall balance assessment: Needs assistance Sitting-balance support: Feet supported Sitting balance-Leahy Scale: Good     Standing balance support: Bilateral upper extremity supported, During functional activity Standing balance-Leahy Scale:  Good                             ADL either performed or assessed with clinical judgement   ADL Overall ADL's : Needs assistance/impaired Eating/Feeding: Supervision/ safety Eating/Feeding  Details (indicate cue type and reason): Drinking soda with straw                 Lower Body Dressing: Supervision/safety Lower Body Dressing Details (indicate cue type and reason): Seated on EOB - personal shoes             Functional mobility during ADLs: Rolling walker (2 wheels);Contact guard assist       Vision Baseline Vision/History: 1 Wears glasses       Perception         Praxis         Pertinent Vitals/Pain Pain Assessment Pain Assessment: No/denies pain     Extremity/Trunk Assessment Upper Extremity Assessment Upper Extremity Assessment: Generalized weakness   Lower Extremity Assessment Lower Extremity Assessment: Defer to PT evaluation   Cervical / Trunk Assessment Cervical / Trunk Assessment: Normal   Communication Communication Communication: No apparent difficulties Cueing Techniques: Verbal cues;Visual cues   Cognition Arousal: Alert Behavior During Therapy: WFL for tasks assessed/performed Overall Cognitive Status: Within Functional Limits for tasks assessed                                 General Comments: AOx4 pleasant and willing to participate in session     General Comments  Will continue to assess when Spo2 stats allow    Exercises Other Exercises Other Exercises: Edu: energy consrvation techniques, perslip breathing   Shoulder Instructions      Home Living Family/patient expects to be discharged to:: Private residence Living Arrangements: Spouse/significant other Available Help at Discharge: Family Type of Home: House Home Access: Ramped entrance     Home Layout: One level     Bathroom Shower/Tub: Arts development officer Toilet: Handicapped height Bathroom Accessibility: No   Home Equipment: Agricultural consultant (2 wheels);Wheelchair - power;Shower seat;Grab bars - toilet;Grab bars - tub/shower;Lift chair          Prior Functioning/Environment Prior Level of Function : Independent/Modified  Independent             Mobility Comments: pt does not drive, son or daughter-in-law help with IADL's ADLs Comments: Independent/Mod I with all ADL's. Pt reports using walker in home and walker to get to WC. WC is used for prolonged mobility.        OT Problem List: Decreased strength;Decreased coordination;Decreased range of motion;Decreased activity tolerance;Decreased safety awareness      OT Treatment/Interventions: Self-care/ADL training;Therapeutic exercise;Therapeutic activities;Energy conservation;DME and/or AE instruction;Patient/family education    OT Goals(Current goals can be found in the care plan section) Acute Rehab OT Goals Patient Stated Goal: to return to PLOF OT Goal Formulation: With patient Time For Goal Achievement: 05/06/23 Potential to Achieve Goals: Good ADL Goals Pt Will Perform Grooming: with modified independence;sitting;standing Pt Will Perform Lower Body Dressing: with modified independence;sitting/lateral leans Pt Will Transfer to Toilet: with modified independence;ambulating Pt Will Perform Toileting - Clothing Manipulation and hygiene: with modified independence;sit to/from stand  OT Frequency: Min 1X/week    Co-evaluation PT/OT/SLP Co-Evaluation/Treatment: Yes Reason for Co-Treatment: Complexity of the patient's impairments (multi-system involvement);To address functional/ADL transfers   OT goals addressed during session: ADL's  and self-care;Proper use of Adaptive equipment and DME      AM-PAC OT "6 Clicks" Daily Activity     Outcome Measure Help from another person eating meals?: None Help from another person taking care of personal grooming?: A Lot Help from another person toileting, which includes using toliet, bedpan, or urinal?: A Lot Help from another person bathing (including washing, rinsing, drying)?: A Lot Help from another person to put on and taking off regular upper body clothing?: A Little Help from another person to put on  and taking off regular lower body clothing?: A Little 6 Click Score: 16   End of Session Equipment Utilized During Treatment: Rolling walker (2 wheels);Oxygen Nurse Communication: Mobility status  Activity Tolerance: Patient tolerated treatment well Patient left: Other (comment)  OT Visit Diagnosis: Unsteadiness on feet (R26.81);History of falling (Z91.81);Other abnormalities of gait and mobility (R26.89);Muscle weakness (generalized) (M62.81)                Time: 4098-1191 OT Time Calculation (min): 19 min Charges:  OT General Charges $OT Visit: 1 Visit OT Evaluation $OT Eval Moderate Complexity: 1 Mod  [employer], OTS

## 2023-04-22 NOTE — Plan of Care (Signed)

## 2023-04-22 NOTE — Evaluation (Signed)
Physical Therapy Evaluation Patient Details Name: [name] MRN: [id number] DOB: 04/01/1929 Today's Date: 04/22/2023  History of Present Illness  [name] is a 89yoM who [facility]/20/23 2/2 SOB. Pt here 10/13-10/15 for dyspnea. PMH: Chronic systolic HF s/p AICD, AF on eliquis, CAD, bilateral carotid stenosis, AAA, DM2, pulmonary fibrosis on 5L chronic, and HLD.  Clinical Impression  [name] is feeling slightly better today, but his dizziness and dyspnea are easily provoked with any mobility, far worse that typical. Coming to EOB creates a 2-3 minute EOB recovery. He is able to rise to standing briefly, but quickly becomes exacerbated again. Pt's sats are stable, do not correlate to DOE, likely more related to cardiac issues. His pressures are soft and orthostatic in session. Pt understands that his symptoms may not improve much which would mean his mobility strategies would need to be different should he DC to home, he essentially being nonambulatory at present. He reports able to navigate household with [facility], also remarks on supportive family team. WIll continue to follow, advance as tolerated.   Orthostatic VS for the past 24 hrs:  BP- Lying Pulse- Lying BP- Sitting Pulse- Sitting  04/22/23 1105 105/54 65 (!) 86/59 69           If plan is discharge home, recommend the following: Assist for transportation;Help with stairs or ramp for entrance;A lot of help with walking and/or transfers;A little help with bathing/dressing/bathroom   Can travel by private vehicle        Equipment Recommendations None recommended by PT  Recommendations for Other Services       Functional Status Assessment Patient has had a recent decline in their functional status and demonstrates the ability to make significant improvements in function in a reasonable and predictable amount of time.     Precautions / Restrictions Precautions Precautions: Fall Restrictions Weight Bearing Restrictions:  No      Mobility  Bed Mobility Overal bed mobility: Independent                  Transfers Overall transfer level: Needs assistance Equipment used: Rolling walker (2 wheels) Transfers: Sit to/from Stand Sit to Stand: Supervision           General transfer comment: slight bed elevation    Ambulation/Gait Ambulation/Gait assistance:  (deferred due to uncontrolled symptoms with activity)                Stairs            Wheelchair Mobility     Tilt Bed    Modified Rankin (Stroke Patients Only)       Balance                                             Pertinent Vitals/Pain Pain Assessment Pain Assessment: No/denies pain    Home Living Family/patient expects to be discharged to:: Private residence Living Arrangements: Spouse/significant other Available Help at Discharge: Family Type of Home: House Home Access: Ramped entrance       Home Layout: One level Home Equipment: Agricultural consultant (2 wheels);Wheelchair - power;Shower seat;Grab bars - toilet;Grab bars - tub/shower      Prior Function Prior Level of Function : Independent/Modified Independent             Mobility Comments: pt does not drive, son or daughter-in-law help with IADL's  ADLs Comments: Independent/Mod I with all ADL's. Pt reports using walker in home and walker to get to WC. WC is used for prolongded mobility.     Extremity/Trunk Assessment                Communication      Cognition Arousal: Alert Behavior During Therapy: WFL for tasks assessed/performed Overall Cognitive Status: Within Functional Limits for tasks assessed                                          General Comments      Exercises     Assessment/Plan    PT Assessment Patient needs continued PT services  PT Problem List Decreased activity tolerance;Decreased mobility       PT Treatment Interventions Gait training;DME instruction;Functional  mobility training;Therapeutic activities    PT Goals (Current goals can be found in the Care Plan section)  Acute Rehab PT Goals Patient Stated Goal: To go home PT Goal Formulation: With patient Time For Goal Achievement: 05/06/23 Potential to Achieve Goals: Fair    Frequency Min 1X/week     Co-evaluation               AM-PAC PT "6 Clicks" Mobility  Outcome Measure Help needed turning from your back to your side while in a flat bed without using bedrails?: None Help needed moving from lying on your back to sitting on the side of a flat bed without using bedrails?: None Help needed moving to and from a bed to a chair (including a wheelchair)?: A Little Help needed standing up from a chair using your arms (e.g., wheelchair or bedside chair)?: A Little Help needed to walk in hospital room?: A Lot Help needed climbing 3-5 steps with a railing? : A Lot 6 Click Score: 18    End of Session   Activity Tolerance: Patient limited by fatigue;Patient limited by lethargy;Other (comment) (DOE, dizziness) Patient left: in bed;with call bell/phone within reach   PT Visit Diagnosis: Other abnormalities of gait and mobility (R26.89);History of falling (Z91.81)    Time: 1040-1100 PT Time Calculation (min) (ACUTE ONLY): 20 min   Charges:   PT Evaluation $PT Eval High Complexity: 1 High   PT General Charges $$ ACUTE PT VISIT: 1 Visit        11:16 AM, 04/22/23 Rosamaria Lints, PT, DPT Physical Therapist - [facility]  [phone] (ASCOM)    Ting Cage C 04/22/2023, 11:10 AM

## 2023-04-22 NOTE — Progress Notes (Signed)
[facility] CLINIC CARDIOLOGY PROGRESS NOTE   Patient ID: [name] MRN: [id number] DOB/AGE: [birth year] 87 y.o.  Admit date: 04/20/2023 Referring Physician Dr Arville Care Primary Physician Lenon Oms, NP Primary Cardiologist Dr. Dorothyann Peng Reason for Consultation AECHF  HPI: [name] is a 89 y.o. male  with a past medical history of coronary artery disease, atrial fibrillation, chronic systolic CHF, ventricular tachycardia, AICD in place, hyperlipidemia, type 2 diabetes, bilateral carotid artery stenosis, AAA  who presented to the ED with acute on chronic HFrEF. Patient was recently hospitalized for VT s/p ICD discharge just a few days ago. Cardiology was consulted for further evaluation.   Interval History:  -Patient seen and examined at bedside, resting comfortably. No events overnight. His breathing continues to improve but he is very short of breath with minimal activity. He is quite deconditioned so likely that is also playing a factor here. He is trying to work with PT/OT and get stronger. At rest, he denies chest pain, shortness of breath, palpitations, diaphoresis, syncope, edema, PND, orthopnea.   Review of systems complete and found to be negative unless listed above    Vitals:   04/22/23 0600 04/22/23 0800 04/22/23 1150 04/22/23 1200  BP:  110/65  (!) 90/58  Pulse:  60 (!) 59 (!) 59  Resp:  19 20   Temp:   (!) 97.1 F (36.2 C)   TempSrc:   Axillary   SpO2:  100% 100%   Weight: 69.9 kg     Height:         Intake/Output Summary (Last 24 hours) at 04/22/2023 1251 Last data filed at 04/22/2023 1159 Gross per 24 hour  Intake 360 ml  Output 1225 ml  Net -865 ml     PHYSICAL EXAM General: alert, well nourished, in no acute distress. HEENT: Normocephalic and atraumatic. Neck: No JVD.  Lungs: Normal respiratory effort. Clear bilaterally to auscultation. No wheezes, crackles, rhonchi.  Heart: HRRR. Normal S1 and S2 without gallops or murmurs. Radial & DP  pulses 2+ bilaterally. Abdomen: Non-distended appearing.  Msk: Normal strength and tone for age. Extremities: No clubbing, cyanosis or edema.   Neuro: Alert and oriented X 3. Psych: Mood appropriate, affect congruent.    LABS: Basic Metabolic Panel: Recent Labs    04/20/23 1813 04/21/23 0342 04/22/23 0800  NA 137 138 138  K 5.0 3.9 3.7  CL 100 101 101  CO2 25 27 26   GLUCOSE 284* 204* 190*  BUN 32* 30* 33*  CREATININE 1.40* 1.31* 1.49*  CALCIUM 9.2 8.8* 8.6*  MG 2.2  --  2.1   Liver Function Tests: Recent Labs    04/20/23 1813  AST 26  ALT 17  ALKPHOS 98  BILITOT 0.9  PROT 8.7*  ALBUMIN 3.8   No results for input(s): "LIPASE", "AMYLASE" in the last 72 hours. CBC: Recent Labs    04/20/23 1813 04/21/23 0342  WBC 12.3* 12.2*  HGB 13.4 13.2  HCT 43.0 40.6  MCV 107.5* 104.4*  PLT 158 160   Cardiac Enzymes: Recent Labs    04/20/23 1813  TROPONINIHS 94*   BNP: Recent Labs    04/20/23 1813  BNP 1,117.9*   D-Dimer: No results for input(s): "DDIMER" in the last 72 hours. Hemoglobin A1C: No results for input(s): "HGBA1C" in the last 72 hours. Fasting Lipid Panel: No results for input(s): "CHOL", "HDL", "LDLCALC", "TRIG", "CHOLHDL", "LDLDIRECT" in the last 72 hours. Thyroid Function Tests: Recent Labs    04/20/23 1813  TSH 2.997  Anemia Panel: No results for input(s): "VITAMINB12", "FOLATE", "FERRITIN", "TIBC", "IRON", "RETICCTPCT" in the last 72 hours.  DG Chest 2 View  Result Date: 04/20/2023 CLINICAL DATA:  Shortness of breath. EXAM: CHEST - 2 VIEW COMPARISON:  04/15/2023. FINDINGS: Since the prior study, there are increased interstitial markings throughout bilateral lungs. No dense consolidation or lung collapse. Bilateral costophrenic angles are clear. Stable cardio-mediastinal silhouette. There is a left sided 2-lead pacemaker. No acute osseous abnormalities. The soft tissues are within normal limits. IMPRESSION: 1. Increased interstitial  markings throughout bilateral lungs, most in keeping with pulmonary edema. Differential diagnosis also includes ARDS and multilobar pneumonia, which are less likely in the given clinical context. Electronically Signed   By: Jules Schick M.D.   On: 04/20/2023 18:46     ECHO  10/2022: 1. The left ventricle is mildly dilated in size with normal wall thickness.    2. The left ventricular systolic function is severely decreased, LVEF is  visually estimated at 30%.    3. There is thinning and akinesis of the anteroapical and apical segments.    4. Mitral annular calcification is present (mild).    5. The mitral valve leaflets are mildly thickened with normal leaflet  mobility.   6. The aortic valve is probably trileaflet with mildly thickened leaflets  with mildly reduced excursion.    7. There is mild aortic regurgitation.    8. The right ventricle is normal in size, with normal systolic function.   TELEMETRY reviewed by me 04/22/23: NSR with intermittent Afib   EKG reviewed by me 04/22/23: normal sinus rhythm with rate of 69 bpm  DATA reviewed by me 04/22/23: last 24h vitals tele labs imaging I/O ED provider note, admission H&P    ASSESSMENT AND PLAN:  Principal Problem:   Acute on chronic systolic heart failure (HCC) Active Problems:   Type 2 diabetes mellitus with peripheral neuropathy (HCC)   History of ventricular tachycardia   Paroxysmal atrial fibrillation (HCC)   Acute on chronic systolic (congestive) heart failure (HCC)   [name] is a 89 y.o. male  with a past medical history of coronary artery disease, atrial fibrillation, chronic systolic CHF, ventricular tachycardia, AICD in place, hyperlipidemia, type 2 diabetes, bilateral carotid artery stenosis, AAA  who presented to the ED with acute on chronic HFrEF. Cardiology was consulted for further evaluation.      # Acute on Chronic HFrEF With most recent EF 30% on echo 10/2022. Has been tolerating GDMT well. -Restart  home cardiac meds as BP/Creatinine allow -BNP significantly elevated. Slight trop bump likely due to [facility]. -Continue with IV Lasix 40 mg BID -Strict I's and O's and daily weights.  -Maintain K>4 and Mag>2     # Hx of VT s/p ICD discharge Patient with ICD discharge x1 on 04/16/2023, apparent VT on device interrogation.  -Amiodarone 400 mg bid for 10 days, then taper.      # Paroxysmal atrial fibrillation Patient with hx of pAF, on metoprolol for rate control at home. Historically not anticoagulated given hx of bleeding and age. -Continue metoprolol succinate 12.5 mg daily for rate control. Amiodarone as above. -Anticoagulation deferred historically.     # AKI  Cr elevated at 1.49 today (baseline ~1.0) Management per primary team     # Interstitial lung disease Patient with hx of ILD followed by pulmonology outpatient. On chronic 4-5L supplemental O2 at baseline.    Clotilde Dieter, DO 04/22/2023, 12:51 PM [facility] Cardiology

## 2023-04-22 NOTE — Progress Notes (Addendum)
PROGRESS NOTE    [name]  QIH:[id number] DOB: 10-28-1928 DOA: 04/20/2023 PCP: Myrene Buddy, NP    Brief Narrative:  89 y.o. Caucasian male with medical history significant for coronary artery disease, CHF, type 2 diabetes mellitus and peripheral neuropathy, stage IIIa CKD, interstitial pulmonary fibrosis on chronic home O2 at 5 L/min, history of ventricular tachycardia on amiodarone, status post AICD, and atrial fibrillation on Eliquis, who presented to the emergency room with acute onset of dyspnea with associated orthopnea and paroxysmal nocturnal dyspnea as well as dyspnea on exertion.  He admitted to mild dry cough without wheezing.  No lower extremity edema.  No chest pain or palpitations.  No fever or chills.Marland Kitchen  He was recently hospitalized for 3 days with worsening dyspnea.  Today she desatted to 70% on 6 L of O2 by nasal cannula and required 100% nonrebreather and by the time he came to the ER he was placed on 6 L of O2 by nasal cannula.  No nausea or vomiting or abdominal pain.  No bleeding diathesis.  He has not missed any of her medications.  She had her Lasix recently discontinued when they started her on amiodarone.  No fever or chills.   Assessment & Plan:   Principal Problem:   Acute on chronic systolic heart failure (HCC) Active Problems:   Paroxysmal atrial fibrillation (HCC)   Type 2 diabetes mellitus with peripheral neuropathy (HCC)   History of ventricular tachycardia   Acute on chronic systolic (congestive) heart failure (HCC)   Acute on chronic systolic (congestive) heart failure (HCC) Patient has evidence of fluid overload Reports he was taken off his outpatient Lasix when amiodarone was initiated Plan: Continue IV Lasix 40 mg twice daily Target net -1-1.5 L daily Since and outs, daily weights Fluid restrict, low-sodium diet  Acute kidney injury Unclear etiology.  Could be related to prerenal azotemia in the setting of relative hypotension.  Also  possibly ATN versus cardiorenal syndrome Plan: Check urinalysis Continue Lasix 40 mg IV twice daily for today Recheck creatinine in a.m. Restart home midodrine for blood pressure support Consider nephrology consult if creatinine continues to worsen Patient may benefit from Lasix gtt.  Paroxysmal atrial fibrillation (HCC) Rate controlled currently Continue amiodarone and aspirin Not on anticoagulation for unclear reasons, presumably old age and fall risk  Acute on chronic hypoxic respiratory failure Chronic interstitial lung disease Patient with baseline 5 L requirement Desaturated on 6 L down to 70% This has improved Plan: Diuresis as above Wean oxygen as tolerated Currently on heart rate   History of ventricular tachycardia PTA amiodarone   Type 2 diabetes mellitus with peripheral neuropathy (HCC) PTA Neurontin NovoLog SSI  DVT prophylaxis: SQ Lovenox Code Status: DNR Family Communication: Son via phone 10/20 Disposition Plan: Status is: Inpatient Remains inpatient appropriate because: Acute decompensated heart failure/AKI     Level of care: Progressive  Consultants:  Cardiology-[facility]  Procedures:  None  Antimicrobials: None   Subjective: Seen and examined.  Resting in bed.  In good spirits.  Reports symptomatic improvement.  Objective: Vitals:   04/22/23 0600 04/22/23 0800 04/22/23 1150 04/22/23 1200  BP:  110/65  (!) 90/58  Pulse:  60 (!) 59 (!) 59  Resp:  19 20   Temp:   (!) 97.1 F (36.2 C)   TempSrc:   Axillary   SpO2:  100% 100%   Weight: 69.9 kg     Height:        Intake/Output Summary (Last 24  hours) at 04/22/2023 1440 Last data filed at 04/22/2023 1159 Gross per 24 hour  Intake 360 ml  Output 1225 ml  Net -865 ml   Filed Weights   04/20/23 1727 04/21/23 0500 04/22/23 0600  Weight: 71.6 kg 69.8 kg 69.9 kg    Examination:  General exam: No acute distress Respiratory system: Bilateral crackles.  Normal work of breathing.  5  L Cardiovascular system: S1-S2, RRR, no murmurs, 1+ pitting edema BLE Gastrointestinal system: Thin, soft, NT/ND, normal bowel sounds Central nervous system: Alert and oriented. No focal neurological deficits. Extremities: Symmetric 5 x 5 power. Skin: No rashes, lesions or ulcers Psychiatry: Judgement and insight appear normal. Mood & affect appropriate.     Data Reviewed: I have personally reviewed following labs and imaging studies  CBC: Recent Labs  Lab 04/15/23 2317 04/16/23 0735 04/20/23 1813 04/21/23 0342  WBC 9.5 10.0 12.3* 12.2*  NEUTROABS 5.5  --   --   --   HGB 13.3 13.0 13.4 13.2  HCT 42.0 41.4 43.0 40.6  MCV 107.1* 106.2* 107.5* 104.4*  PLT 164 159 158 160   Basic Metabolic Panel: Recent Labs  Lab 04/15/23 2317 04/17/23 0516 04/20/23 1813 04/21/23 0342 04/22/23 0800  NA 137 135 137 138 138  K 4.3 4.5 5.0 3.9 3.7  CL 101 99 100 101 101  CO2 25 27 25 27 26   GLUCOSE 264* 176* 284* 204* 190*  BUN 42* 38* 32* 30* 33*  CREATININE 1.47* 1.42* 1.40* 1.31* 1.49*  CALCIUM 8.7* 9.1 9.2 8.8* 8.6*  MG 2.2  --  2.2  --  2.1   GFR: Estimated Creatinine Clearance: 31.3 mL/min (A) (by C-G formula based on SCr of 1.49 mg/dL (H)). Liver Function Tests: Recent Labs  Lab 04/15/23 2317 04/20/23 1813  AST 24 26  ALT 10 17  ALKPHOS 72 98  BILITOT 0.7 0.9  PROT 7.9 8.7*  ALBUMIN 3.6 3.8   No results for input(s): "LIPASE", "AMYLASE" in the last 168 hours. No results for input(s): "AMMONIA" in the last 168 hours. Coagulation Profile: No results for input(s): "INR", "PROTIME" in the last 168 hours. Cardiac Enzymes: No results for input(s): "CKTOTAL", "CKMB", "CKMBINDEX", "TROPONINI" in the last 168 hours. BNP (last 3 results) No results for input(s): "PROBNP" in the last 8760 hours. HbA1C: No results for input(s): "HGBA1C" in the last 72 hours. CBG: Recent Labs  Lab 04/16/23 0742 04/16/23 0746 04/16/23 1149 04/16/23 2153 04/17/23 0831  GLUCAP 61* 116* 216*  140* 193*   Lipid Profile: No results for input(s): "CHOL", "HDL", "LDLCALC", "TRIG", "CHOLHDL", "LDLDIRECT" in the last 72 hours. Thyroid Function Tests: Recent Labs    04/20/23 1813  TSH 2.997  FREET4 1.05   Anemia Panel: No results for input(s): "VITAMINB12", "FOLATE", "FERRITIN", "TIBC", "IRON", "RETICCTPCT" in the last 72 hours. Sepsis Labs: No results for input(s): "PROCALCITON", "LATICACIDVEN" in the last 168 hours.  No results found for this or any previous visit (from the past 240 hour(s)).       Radiology Studies: DG Chest 2 View  Result Date: 04/20/2023 CLINICAL DATA:  Shortness of breath. EXAM: CHEST - 2 VIEW COMPARISON:  04/15/2023. FINDINGS: Since the prior study, there are increased interstitial markings throughout bilateral lungs. No dense consolidation or lung collapse. Bilateral costophrenic angles are clear. Stable cardio-mediastinal silhouette. There is a left sided 2-lead pacemaker. No acute osseous abnormalities. The soft tissues are within normal limits. IMPRESSION: 1. Increased interstitial markings throughout bilateral lungs, most in keeping with pulmonary edema.  Differential diagnosis also includes ARDS and multilobar pneumonia, which are less likely in the given clinical context. Electronically Signed   By: Jules Schick M.D.   On: 04/20/2023 18:46        Scheduled Meds:  amiodarone  400 mg Oral BID   Followed by   Melene Muller ON 04/26/2023] amiodarone  400 mg Oral Daily   Followed by   Melene Muller ON 05/06/2023] amiodarone  200 mg Oral Daily   aspirin EC  81 mg Oral Daily   benzonatate  200 mg Oral TID   brimonidine  1 drop Both Eyes BID   enoxaparin (LOVENOX) injection  40 mg Subcutaneous Q24H   furosemide  40 mg Intravenous Q12H   gabapentin  300 mg Oral BID   loratadine  10 mg Oral Daily   metoprolol succinate  12.5 mg Oral Daily   midodrine  5 mg Oral TID WC   Continuous Infusions:   LOS: 1 day    Tresa Moore, MD [facility]   If 7PM-7AM, please contact night-coverage  04/22/2023, 2:40 PM

## 2023-04-23 DIAGNOSIS — I5023 Acute on chronic systolic (congestive) heart failure: Secondary | ICD-10-CM | POA: Diagnosis not present

## 2023-04-23 LAB — CBC
HCT: 44.4 % (ref 39.0–52.0)
Hemoglobin: 14 g/dL (ref 13.0–17.0)
MCH: 33.3 pg (ref 26.0–34.0)
MCHC: 31.5 g/dL (ref 30.0–36.0)
MCV: 105.7 fL — ABNORMAL HIGH (ref 80.0–100.0)
Platelets: 191 10*3/uL (ref 150–400)
RBC: 4.2 MIL/uL — ABNORMAL LOW (ref 4.22–5.81)
RDW: 12.8 % (ref 11.5–15.5)
WBC: 11.5 10*3/uL — ABNORMAL HIGH (ref 4.0–10.5)
nRBC: 0 % (ref 0.0–0.2)

## 2023-04-23 LAB — BASIC METABOLIC PANEL
Anion gap: 13 (ref 5–15)
BUN: 37 mg/dL — ABNORMAL HIGH (ref 8–23)
CO2: 27 mmol/L (ref 22–32)
Calcium: 9.3 mg/dL (ref 8.9–10.3)
Chloride: 97 mmol/L — ABNORMAL LOW (ref 98–111)
Creatinine, Ser: 1.53 mg/dL — ABNORMAL HIGH (ref 0.61–1.24)
GFR, Estimated: 42 mL/min — ABNORMAL LOW (ref >60)
Glucose, Bld: 204 mg/dL — ABNORMAL HIGH (ref 70–99)
Potassium: 3.4 mmol/L — ABNORMAL LOW (ref 3.5–5.1)
Sodium: 137 mmol/L (ref 135–145)

## 2023-04-23 MED ORDER — FUROSEMIDE 40 MG PO TABS: 40.0000 mg | ORAL_TABLET | Freq: Every day | ORAL | Status: DC

## 2023-04-23 MED ORDER — FUROSEMIDE 40 MG PO TABS
40.0000 mg | ORAL_TABLET | Freq: Every day | ORAL | Status: DC
  Administered 2023-04-23: 40 mg via ORAL
  Filled 2023-04-23 (×2): qty 1

## 2023-04-23 NOTE — TOC Initial Note (Signed)
Transition of Care [facility]) - Initial/Assessment Note    Patient Details  Name: [name] MRN: [id number] Date of Birth: 12/23/1928  Transition of Care [facility]) CM/SW Contact:    Darolyn Rua, LCSW Phone Number: 04/23/2023, 10:40 AM  Clinical Narrative:                  CSW met with patient at bedside to review [facility] recommendations, he is in agreement with [facility] with no preference of agency. He does report PCP is Lenon Oms, NP, confirmed [place] address in chart as accurate. Reports has home o2 but does not remember the agency it's through, son to transport at time of discharge and aware to bring home o2.   No additional dc needs, pending HH agency acceptance for [facility].   Barriers to Discharge: Continued Medical Work up   Patient Goals and CMS Choice Patient states their goals for this hospitalization and ongoing recovery are:: to go home CMS Medicare.gov Compare Post Acute Care list provided to:: Patient Choice offered to / list presented to : Patient      Expected Discharge Plan and Services       Living arrangements for the past 2 months: Single Family Home                                      Prior Living Arrangements/Services Living arrangements for the past 2 months: Single Family Home Lives with:: Self                   Activities of Daily Living   ADL Screening (condition at time of admission) Independently performs ADLs?: No Does the patient have a NEW difficulty with bathing/dressing/toileting/self-feeding that is expected to last >3 days?: No Does the patient have a NEW difficulty with getting in/out of bed, walking, or climbing stairs that is expected to last >3 days?: No Does the patient have a NEW difficulty with communication that is expected to last >3 days?: No Is the patient deaf or have difficulty hearing?: No Does the patient have difficulty seeing, even when wearing glasses/contacts?: No Does the patient have difficulty  concentrating, remembering, or making decisions?: No  Permission Sought/Granted                  Emotional Assessment       Orientation: : Oriented to Place, Oriented to  Time, Oriented to Self, Oriented to Situation Alcohol / Substance Use: Not Applicable Psych Involvement: No (comment)  Admission diagnosis:  Acute on chronic systolic heart failure (HCC) [I50.23] Hypoxia [R09.02] Acute on chronic systolic (congestive) heart failure (HCC) [I50.23] Patient Active Problem List   Diagnosis Date Noted   Acute on chronic systolic (congestive) heart failure (HCC) 04/21/2023   Acute on chronic systolic heart failure (HCC) 04/20/2023   Type 2 diabetes mellitus with peripheral neuropathy (HCC) 04/20/2023   History of ventricular tachycardia 04/20/2023   Paroxysmal atrial fibrillation (HCC) 04/20/2023   CKD stage 3a, GFR 45-59 ml/min (HCC) 04/17/2023   AICD discharge 04/16/2023   Chronic hypotension 04/16/2023   Uncontrolled type 2 diabetes mellitus with hyperglycemia, without long-term current use of insulin (HCC) 04/16/2023   AAA (abdominal aortic aneurysm) (HCC) 04/16/2023   Ventricular tachycardia (HCC) 04/16/2023   Myocardial ischemia 04/16/2023   Chronic respiratory failure with hypoxia (HCC) 04/16/2023   History of implantable cardioverter-defibrillator (ICD) placement 10/04/2022   Acute on chronic systolic  congestive heart failure, NYHA class 3 (HCC) 10/04/2022   Community acquired pneumonia 10/24/2020   Acute respiratory failure with hypoxia (HCC) 10/24/2020   Influenza A 10/24/2020   Diabetes mellitus without complication (HCC) 08/23/2020   Postural dizziness with presyncope 08/23/2020   AICD (automatic cardioverter/defibrillator) present 08/23/2020   Carotid stenosis 08/23/2020   Sacral decubitus ulcer 08/23/2020   Near syncope 08/23/2020   Chronic systolic CHF (congestive heart failure), NYHA class 3 (HCC) 07/20/2020   Coronary artery disease involving native  coronary artery of native heart 07/20/2020   Hyperlipidemia, mixed 07/20/2020   Bilateral carotid artery stenosis 07/20/2020   Coronary atherosclerosis of native coronary artery 07/20/2020   Atrial fibrillation (HCC) 07/06/2020   PCP:  Myrene Buddy, NP Pharmacy:   [id number], [place]  - [address] AT [facility] OF [address] [facility] [place] [address] Phone: [phone] Fax: [phone]     Social Determinants of Health (SDOH) Social History: SDOH Screenings   Food Insecurity: No Food Insecurity (04/20/2023)  Housing: Low Risk  (04/20/2023)  Transportation Needs: No Transportation Needs (04/20/2023)  Utilities: Not At Risk (04/20/2023)  Financial Resource Strain: Low Risk  (10/05/2022)   Received from [facility]  Tobacco Use: Low Risk  (04/20/2023)   SDOH Interventions:     Readmission Risk Interventions    10/26/2020   11:25 AM  Readmission Risk Prevention Plan  Transportation Screening Complete  PCP or Specialist Appt within 3-5 Days Complete  HRI or Home Care Consult Complete  Social Work Consult for Recovery Care Planning/Counseling Complete  Palliative Care Screening Not Applicable  Medication Review Oceanographer) Complete

## 2023-04-23 NOTE — Plan of Care (Signed)
  Problem: Pain Managment: Goal: General experience of comfort will improve Outcome: Progressing   Problem: Safety: Goal: Ability to remain free from injury will improve Outcome: Progressing   Problem: Skin Integrity: Goal: Risk for impaired skin integrity will decrease Outcome: Progressing   

## 2023-04-23 NOTE — Progress Notes (Signed)
PROGRESS NOTE    READ SCHARR  MWN:[id number] DOB: [birth year] DOA: 04/20/2023 PCP: Myrene Buddy, NP    Brief Narrative:  89 y.o. Caucasian male with medical history significant for coronary artery disease, CHF, type 2 diabetes mellitus and peripheral neuropathy, stage IIIa CKD, interstitial pulmonary fibrosis on chronic home O2 at 5 L/min, history of ventricular tachycardia on amiodarone, status post AICD, and atrial fibrillation on Eliquis, who presented to the emergency room with acute onset of dyspnea with associated orthopnea and paroxysmal nocturnal dyspnea as well as dyspnea on exertion.  He admitted to mild dry cough without wheezing.  No lower extremity edema.  No chest pain or palpitations.  No fever or chills.Marland Kitchen  He was recently hospitalized for 3 days with worsening dyspnea.  Today she desatted to 70% on 6 L of O2 by nasal cannula and required 100% nonrebreather and by the time he came to the ER he was placed on 6 L of O2 by nasal cannula.  No nausea or vomiting or abdominal pain.  No bleeding diathesis.  He has not missed any of her medications.  She had her Lasix recently discontinued when they started her on amiodarone.  No fever or chills.   Assessment & Plan:   Principal Problem:   Acute on chronic systolic heart failure (HCC) Active Problems:   Paroxysmal atrial fibrillation (HCC)   Type 2 diabetes mellitus with peripheral neuropathy (HCC)   History of ventricular tachycardia   Acute on chronic systolic (congestive) heart failure (HCC)   Acute on chronic systolic (congestive) heart failure (HCC) Patient has evidence of fluid overload Reports he was taken off his outpatient Lasix when amiodarone was initiated Plan: Lasix switched to p.o. 40 mg daily Target net -1-1.5 L daily Since and outs, daily weights Fluid restrict, low-sodium diet Recheck creatinine in a.m.  Acute kidney injury Unclear etiology.  Could be related to prerenal azotemia in the setting of  relative hypotension.  Also possibly ATN versus cardiorenal syndrome Urinalysis reassuring, no evidence of infection Plan: IV Lasix stopped  P.o. Lasix 40 mg daily Recheck creatinine in a.m. Continue home midodrine Consider nephrology consult if creatinine continues to worsen  Paroxysmal atrial fibrillation (HCC) Rate controlled currently Continue amiodarone and aspirin Not on anticoagulation for unclear reasons, presumably old age and fall risk  Acute on chronic hypoxic respiratory failure Chronic interstitial lung disease Patient with baseline 5 L requirement Desaturated on 6 L down to 70% This has improved Plan: Diuresis as above Wean oxygen as tolerated currently on home rate   History of ventricular tachycardia PTA amiodarone   Type 2 diabetes mellitus with peripheral neuropathy (HCC) PTA Neurontin NovoLog SSI  DVT prophylaxis: SQ Lovenox  Code Status: DNR Family Communication: Son via phone 10/20 Disposition Plan: Status is: Inpatient Remains inpatient appropriate because: Acute decompensated heart failure/AKI     Level of care: Progressive  Consultants:  Cardiology-[facility]  Procedures:  None  Antimicrobials: None   Subjective: Seen and examined.  Feeling better overall.  No complaints  Objective: Vitals:   04/23/23 0300 04/23/23 0811 04/23/23 0850 04/23/23 1243  BP:  119/72  106/61  Pulse:   78 (!) 59  Resp:   17 20  Temp: 97.8 F (36.6 C) 97.9 F (36.6 C)  98.1 F (36.7 C)  TempSrc: Axillary Oral  Axillary  SpO2:   96% 97%  Weight:      Height:        Intake/Output Summary (Last 24 hours) at 04/23/2023 1528  Last data filed at 04/23/2023 0912 Gross per 24 hour  Intake 240 ml  Output 975 ml  Net -735 ml   Filed Weights   04/20/23 1727 04/21/23 0500 04/22/23 0600  Weight: 71.6 kg 69.8 kg 69.9 kg    Examination:  General exam: NAD Respiratory system: Bilateral crackles.  Normal work of breathing.  5 L Cardiovascular system: S1-S2,  RRR, no murmurs, trace pitting edema BLE Gastrointestinal system: Thin, soft, NT/ND, normal bowel sounds Central nervous system: Alert and oriented. No focal neurological deficits. Extremities: Symmetric 5 x 5 power. Skin: No rashes, lesions or ulcers Psychiatry: Judgement and insight appear normal. Mood & affect appropriate.     Data Reviewed: I have personally reviewed following labs and imaging studies  CBC: Recent Labs  Lab 04/20/23 1813 04/21/23 0342 04/23/23 0918  WBC 12.3* 12.2* 11.5*  HGB 13.4 13.2 14.0  HCT 43.0 40.6 44.4  MCV 107.5* 104.4* 105.7*  PLT 158 160 191   Basic Metabolic Panel: Recent Labs  Lab 04/17/23 0516 04/20/23 1813 04/21/23 0342 04/22/23 0800 04/23/23 0918  NA 135 137 138 138 137  K 4.5 5.0 3.9 3.7 3.4*  CL 99 100 101 101 97*  CO2 27 25 27 26 27   GLUCOSE 176* 284* 204* 190* 204*  BUN 38* 32* 30* 33* 37*  CREATININE 1.42* 1.40* 1.31* 1.49* 1.53*  CALCIUM 9.1 9.2 8.8* 8.6* 9.3  MG  --  2.2  --  2.1  --    GFR: Estimated Creatinine Clearance: 30.5 mL/min (A) (by C-G formula based on SCr of 1.53 mg/dL (H)). Liver Function Tests: Recent Labs  Lab 04/20/23 1813  AST 26  ALT 17  ALKPHOS 98  BILITOT 0.9  PROT 8.7*  ALBUMIN 3.8   No results for input(s): "LIPASE", "AMYLASE" in the last 168 hours. No results for input(s): "AMMONIA" in the last 168 hours. Coagulation Profile: No results for input(s): "INR", "PROTIME" in the last 168 hours. Cardiac Enzymes: No results for input(s): "CKTOTAL", "CKMB", "CKMBINDEX", "TROPONINI" in the last 168 hours. BNP (last 3 results) No results for input(s): "PROBNP" in the last 8760 hours. HbA1C: No results for input(s): "HGBA1C" in the last 72 hours. CBG: Recent Labs  Lab 04/16/23 2153 04/17/23 0831  GLUCAP 140* 193*   Lipid Profile: No results for input(s): "CHOL", "HDL", "LDLCALC", "TRIG", "CHOLHDL", "LDLDIRECT" in the last 72 hours. Thyroid Function Tests: Recent Labs    04/20/23 1813   TSH 2.997  FREET4 1.05   Anemia Panel: No results for input(s): "VITAMINB12", "FOLATE", "FERRITIN", "TIBC", "IRON", "RETICCTPCT" in the last 72 hours. Sepsis Labs: No results for input(s): "PROCALCITON", "LATICACIDVEN" in the last 168 hours.  No results found for this or any previous visit (from the past 240 hour(s)).       Radiology Studies: No results found.      Scheduled Meds:  amiodarone  400 mg Oral BID   Followed by   Melene Muller ON 04/26/2023] amiodarone  400 mg Oral Daily   Followed by   Melene Muller ON 05/06/2023] amiodarone  200 mg Oral Daily   aspirin EC  81 mg Oral Daily   benzonatate  200 mg Oral TID   brimonidine  1 drop Both Eyes BID   enoxaparin (LOVENOX) injection  40 mg Subcutaneous Q24H   furosemide  40 mg Oral Daily   gabapentin  300 mg Oral BID   loratadine  10 mg Oral Daily   metoprolol succinate  12.5 mg Oral Daily   midodrine  5  mg Oral TID WC   Continuous Infusions:   LOS: 2 days    Tresa Moore, MD [facility]   If 7PM-7AM, please contact night-coverage  04/23/2023, 3:28 PM

## 2023-04-23 NOTE — Progress Notes (Addendum)
Suprapubic cath exchanged by ICU charge RN, Maralyn Sago, per family request.

## 2023-04-23 NOTE — Plan of Care (Signed)

## 2023-04-23 NOTE — Progress Notes (Signed)
[facility] CLINIC CARDIOLOGY PROGRESS NOTE   Patient ID: TRYNT PICKUS MRN: [id number] DOB/AGE: 87/30/1930 87 y.o.  Admit date: 04/20/2023 Referring Physician Dr Arville Care Primary Physician Lenon Oms, NP Primary Cardiologist Dr. Dorothyann Peng Reason for Consultation AECHF  HPI: [name] is a 89 y.o. male with a past medical history of coronary artery disease, atrial fibrillation, chronic systolic CHF, ventricular tachycardia, AICD in place, hyperlipidemia, type 2 diabetes, bilateral carotid artery stenosis, AAA  who presented to the ED with acute on chronic HFrEF. Patient was recently hospitalized for VT s/p ICD discharge just a few days ago. Cardiology was consulted for further evaluation.   Interval History:  -Patient is laying flat in his hospital bed and states his shortness of breath is improving. He reports prior to admission he did have shortness of breath at rest, as well as DOE. Has not been up walking yet.  -Patient denies chest pain, palpitations or swelling. -Cr trending up, has had good UOP.  Review of systems complete and found to be negative unless listed above    Vitals:   04/23/23 0000 04/23/23 0300 04/23/23 0811 04/23/23 0850  BP: 122/62  119/72   Pulse:    78  Resp:    17  Temp: 97.6 F (36.4 C) 97.8 F (36.6 C) 97.9 F (36.6 C)   TempSrc: Oral Axillary Oral   SpO2:    96%  Weight:      Height:         Intake/Output Summary (Last 24 hours) at 04/23/2023 1046 Last data filed at 04/23/2023 0912 Gross per 24 hour  Intake 240 ml  Output 1425 ml  Net -1185 ml    PHYSICAL EXAM General: Chronically ill-appearing elderly male laying flat in his hospital bed, well nourished, in no acute distress. HEENT: Normocephalic and atraumatic. Neck: No JVD.  Lungs: Normal respiratory effort on baseline O2 requirement. Clear bilaterally to auscultation. No wheezes, crackles, rhonchi.  Heart: HRRR. Normal S1 and S2 without gallops or murmurs. Radial & DP  pulses 2+ bilaterally. Abdomen: Non-distended appearing.  Msk: Normal strength and tone for age. Extremities: No clubbing, cyanosis or edema.   Neuro: Alert and oriented X 3. Psych: Mood appropriate, affect congruent.    LABS: Basic Metabolic Panel: Recent Labs    04/20/23 1813 04/21/23 0342 04/22/23 0800 04/23/23 0918  NA 137   < > 138 137  K 5.0   < > 3.7 3.4*  CL 100   < > 101 97*  CO2 25   < > 26 27  GLUCOSE 284*   < > 190* 204*  BUN 32*   < > 33* 37*  CREATININE 1.40*   < > 1.49* 1.53*  CALCIUM 9.2   < > 8.6* 9.3  MG 2.2  --  2.1  --    < > = values in this interval not displayed.   Liver Function Tests: Recent Labs    04/20/23 1813  AST 26  ALT 17  ALKPHOS 98  BILITOT 0.9  PROT 8.7*  ALBUMIN 3.8   No results for input(s): "LIPASE", "AMYLASE" in the last 72 hours. CBC: Recent Labs    04/21/23 0342 04/23/23 0918  WBC 12.2* 11.5*  HGB 13.2 14.0  HCT 40.6 44.4  MCV 104.4* 105.7*  PLT 160 191   Cardiac Enzymes: Recent Labs    04/20/23 1813  TROPONINIHS 94*   BNP: Recent Labs    04/20/23 1813  BNP 1,117.9*   D-Dimer: No results for input(s): "  DDIMER" in the last 72 hours. Hemoglobin A1C: No results for input(s): "HGBA1C" in the last 72 hours. Fasting Lipid Panel: No results for input(s): "CHOL", "HDL", "LDLCALC", "TRIG", "CHOLHDL", "LDLDIRECT" in the last 72 hours. Thyroid Function Tests: Recent Labs    04/20/23 1813  TSH 2.997   Anemia Panel: No results for input(s): "VITAMINB12", "FOLATE", "FERRITIN", "TIBC", "IRON", "RETICCTPCT" in the last 72 hours.  No results found.   ECHO  10/2022: 1. The left ventricle is mildly dilated in size with normal wall thickness.    2. The left ventricular systolic function is severely decreased, LVEF is  visually estimated at 30%.    3. There is thinning and akinesis of the anteroapical and apical segments.    4. Mitral annular calcification is present (mild).    5. The mitral valve leaflets are  mildly thickened with normal leaflet  mobility.   6. The aortic valve is probably trileaflet with mildly thickened leaflets  with mildly reduced excursion.    7. There is mild aortic regurgitation.    8. The right ventricle is normal in size, with normal systolic function.   TELEMETRY reviewed by me 04/23/23: NSR with 1st degree AV block, rate 60s  EKG reviewed by me 04/23/23: normal sinus rhythm with rate of 69 bpm  DATA reviewed by me 04/23/23: last 24h vitals tele labs imaging I/O hospitalist progress note.   ASSESSMENT AND PLAN:  Principal Problem:   Acute on chronic systolic heart failure (HCC) Active Problems:   Type 2 diabetes mellitus with peripheral neuropathy (HCC)   History of ventricular tachycardia   Paroxysmal atrial fibrillation (HCC)   Acute on chronic systolic (congestive) heart failure (HCC)   [name] is a 89 y.o. male  with a past medical history of coronary artery disease, atrial fibrillation, chronic systolic CHF, ventricular tachycardia, AICD in place, hyperlipidemia, type 2 diabetes, bilateral carotid artery stenosis, AAA  who presented to the ED with acute on chronic HFrEF. Cardiology was consulted for further evaluation.      # Acute on chronic HFrEF With most recent EF 30% on echo 10/2022. Has been tolerating GDMT well. BNP elevated on admission to 1100. Troponins 47 > 51 > 44. -Continue home cardiac meds as BP allows.  -Transition to po lasix 40 mg daily today. -Strict I's and O's and daily weights.  -Maintain K>4 and Mag>2   # Hx of VT s/p ICD discharge Patient with ICD discharge x1 on 04/16/2023, apparent VT on device interrogation.  -Amiodarone 400 mg bid for 10 days, then taper. Will start 200 mg bid on 10/24.   # Paroxysmal atrial fibrillation Patient with hx of pAF, on metoprolol for rate control at home. Historically not anticoagulated given hx of bleeding and age. -Continue metoprolol succinate 12.5 mg daily for rate control.  Amiodarone as above. -Anticoagulation deferred historically.   # AKI  Cr elevated on admission at 1.4, up to 1.53 today (baseline ~1.0) -Management per primary team   # Interstitial lung disease Patient with hx of ILD followed by pulmonology outpatient. On chronic 4-5L supplemental O2 at baseline.  -Management per primary.   This patient's plan of care was discussed and created with Dr. Darrold Junker and he is in agreement.    Signed: Gale Journey, PA-C 04/23/2023, 10:46 AM [facility] Cardiology

## 2023-04-24 ENCOUNTER — Inpatient Hospital Stay: Payer: Medicare Other

## 2023-04-24 DIAGNOSIS — I5023 Acute on chronic systolic (congestive) heart failure: Secondary | ICD-10-CM | POA: Diagnosis not present

## 2023-04-24 LAB — CBC WITH DIFFERENTIAL/PLATELET
Abs Immature Granulocytes: 0.08 10*3/uL — ABNORMAL HIGH (ref 0.00–0.07)
Basophils Absolute: 0.1 10*3/uL (ref 0.0–0.1)
Basophils Relative: 1 %
Eosinophils Absolute: 1.1 10*3/uL — ABNORMAL HIGH (ref 0.0–0.5)
Eosinophils Relative: 9 %
HCT: 38.6 % — ABNORMAL LOW (ref 39.0–52.0)
Hemoglobin: 12.6 g/dL — ABNORMAL LOW (ref 13.0–17.0)
Immature Granulocytes: 1 %
Lymphocytes Relative: 19 %
Lymphs Abs: 2.2 10*3/uL (ref 0.7–4.0)
MCH: 34.1 pg — ABNORMAL HIGH (ref 26.0–34.0)
MCHC: 32.6 g/dL (ref 30.0–36.0)
MCV: 104.3 fL — ABNORMAL HIGH (ref 80.0–100.0)
Monocytes Absolute: 1.3 10*3/uL — ABNORMAL HIGH (ref 0.1–1.0)
Monocytes Relative: 11 %
Neutro Abs: 6.9 10*3/uL (ref 1.7–7.7)
Neutrophils Relative %: 59 %
Platelets: 191 10*3/uL (ref 150–400)
RBC: 3.7 MIL/uL — ABNORMAL LOW (ref 4.22–5.81)
RDW: 12.7 % (ref 11.5–15.5)
WBC: 11.7 10*3/uL — ABNORMAL HIGH (ref 4.0–10.5)
nRBC: 0 % (ref 0.0–0.2)

## 2023-04-24 LAB — BASIC METABOLIC PANEL
Anion gap: 10 (ref 5–15)
BUN: 36 mg/dL — ABNORMAL HIGH (ref 8–23)
CO2: 25 mmol/L (ref 22–32)
Calcium: 8.6 mg/dL — ABNORMAL LOW (ref 8.9–10.3)
Chloride: 99 mmol/L (ref 98–111)
Creatinine, Ser: 1.48 mg/dL — ABNORMAL HIGH (ref 0.61–1.24)
GFR, Estimated: 44 mL/min — ABNORMAL LOW (ref >60)
Glucose, Bld: 175 mg/dL — ABNORMAL HIGH (ref 70–99)
Potassium: 3.7 mmol/L (ref 3.5–5.1)
Sodium: 134 mmol/L — ABNORMAL LOW (ref 135–145)

## 2023-04-24 LAB — MAGNESIUM: Magnesium: 2 mg/dL (ref 1.7–2.4)

## 2023-04-24 LAB — GLUCOSE, CAPILLARY: Glucose-Capillary: 243 mg/dL — ABNORMAL HIGH (ref 70–99)

## 2023-04-24 MED ORDER — POTASSIUM CHLORIDE 20 MEQ PO PACK
40.0000 meq | PACK | Freq: Once | ORAL | Status: AC
  Administered 2023-04-24: 40 meq via ORAL
  Filled 2023-04-24: qty 2

## 2023-04-24 MED ORDER — CEPHALEXIN 250 MG PO CAPS
250.0000 mg | ORAL_CAPSULE | Freq: Every day | ORAL | Status: DC
  Administered 2023-04-25: 250 mg via ORAL
  Filled 2023-04-24: qty 1

## 2023-04-24 MED ORDER — FUROSEMIDE 40 MG PO TABS
60.0000 mg | ORAL_TABLET | Freq: Once | ORAL | Status: AC
  Administered 2023-04-24: 60 mg via ORAL
  Filled 2023-04-24: qty 1

## 2023-04-24 MED ORDER — FUROSEMIDE 40 MG PO TABS
40.0000 mg | ORAL_TABLET | Freq: Every day | ORAL | Status: DC
  Administered 2023-04-25: 40 mg via ORAL
  Filled 2023-04-24: qty 1

## 2023-04-24 NOTE — Progress Notes (Signed)
The patient has refused his Lovenox injection. He states that he takes a baby aspirin at home. He does not want an injection for blood thinning.

## 2023-04-24 NOTE — Progress Notes (Signed)
Occupational Therapy Treatment Patient Details Name: [name] MRN: [id number] DOB: May 28, 1929 Today's Date: 04/24/2023   History of present illness Pt is a 89 year old male presenting to the ED with cute onset of dyspnea with associated orthopnea and paroxysmal nocturnal dyspnea as well as dyspnea on exertion, admitted with Acute on chronic systolic (congestive) heart failure (HCC), Acute on chronic hypoxic respiratory failure    PMH significant for coronary artery disease, CHF, type 2 diabetes mellitus and peripheral neuropathy, stage IIIa CKD, interstitial pulmonary fibrosis on chronic home O2 at 5 L/min, history of ventricular tachycardia on amiodarone, status post AICD, and atrial fibrillation on Eliquis   OT comments  Pt seen for OT tx. Pt agreeable, endorses feeling better than this morning. RN cleared OT to work with pt. Pt denied SOB at start of session. Supine BP 122/67. Pt able to come to EOB without direct assist but increased effort noted to complete. Once EOB pt noted mild dizziness/wooziness that resolved with time. Sitting BP 96/70. Pt further educated in activity modifications, home/routines modifications, activity pacing, and work simplification to support ADL/mobility participation and minimize SOB/over exertion. Pt donned socks EOB with some difficulty noted but no direct assist required. Educated in alternative strategy to minimize SOB and improve sitting balance while performing. Pt verbalized understanding. Pt continues to benefit from skilled OT services to maximize return to PLOF.        If plan is discharge home, recommend the following:  Assistance with cooking/housework;Assist for transportation;A lot of help with bathing/dressing/bathroom;A lot of help with walking and/or transfers;Help with stairs or ramp for entrance   Equipment Recommendations       Recommendations for Other Services      Precautions / Restrictions Precautions Precautions: Fall Precaution  Comments: watch bp, spo2 Restrictions Weight Bearing Restrictions: No       Mobility Bed Mobility Overal bed mobility: Needs Assistance Bed Mobility: Sit to Supine, Supine to Sit     Supine to sit: Supervision Sit to supine: Supervision   General bed mobility comments: slow, increased effort to complete    Transfers Overall transfer level: Needs assistance   Transfers: Bed to chair/wheelchair/BSC            Lateral/Scoot Transfers: Supervision General transfer comment: becomes SOB quickly with lateral scoots requiring rest breaks     Balance Overall balance assessment: Needs assistance Sitting-balance support: Feet supported, Single extremity supported, No upper extremity supported, Bilateral upper extremity supported Sitting balance-Leahy Scale: Fair Sitting balance - Comments: intermittent UE vs BUE support EOB more due to poor activity tolerance than pure balance                                   ADL either performed or assessed with clinical judgement   ADL Overall ADL's : Needs assistance/impaired     Grooming: Sitting;Brushing hair;Maximal assistance Grooming Details (indicate cue type and reason): Pt reports being too SOB, requiring MAX A for washing hair and combing hair as pt sat EOB with BUE support on bed focusing on PLB.             Lower Body Dressing: Sitting/lateral leans;Supervision/safety Lower Body Dressing Details (indicate cue type and reason): VC for alternative strategy for donning socks to support improved breathing and balance while seated                    Extremity/Trunk Assessment  Vision       Perception     Praxis      Cognition Arousal: Alert Behavior During Therapy: WFL for tasks assessed/performed Overall Cognitive Status: Within Functional Limits for tasks assessed                                          Exercises Other Exercises Other Exercises: Pt  further educated in activity modifications, home/routines modifications, activity pacing, and work simplification to support ADL/mobility participation and minimize SOB/over exertion.    Shoulder Instructions       General Comments      Pertinent Vitals/ Pain       Pain Assessment Pain Assessment: No/denies pain  Home Living                                          Prior Functioning/Environment              Frequency  Min 1X/week        Progress Toward Goals  OT Goals(current goals can now be found in the care plan section)  Progress towards OT goals: Progressing toward goals  Acute Rehab OT Goals Patient Stated Goal: return to PLOF OT Goal Formulation: With patient Time For Goal Achievement: 05/06/23 Potential to Achieve Goals: Good  Plan      Co-evaluation                 AM-PAC OT "6 Clicks" Daily Activity     Outcome Measure   Help from another person eating meals?: None Help from another person taking care of personal grooming?: A Lot Help from another person toileting, which includes using toliet, bedpan, or urinal?: A Lot Help from another person bathing (including washing, rinsing, drying)?: A Lot Help from another person to put on and taking off regular upper body clothing?: A Little Help from another person to put on and taking off regular lower body clothing?: A Little 6 Click Score: 16    End of Session Equipment Utilized During Treatment: Oxygen  OT Visit Diagnosis: Unsteadiness on feet (R26.81);History of falling (Z91.81);Other abnormalities of gait and mobility (R26.89);Muscle weakness (generalized) (M62.81)   Activity Tolerance Patient tolerated treatment well   Patient Left in bed;with call bell/phone within reach;with bed alarm set;Other (comment) (PT in room for session)   Nurse Communication Mobility status        Time: [phone] OT Time Calculation (min): 23 min  Charges: OT General Charges $OT  Visit: 1 Visit OT Treatments $Self Care/Home Management : 23-37 mins  Arman Filter., MPH, MS, OTR/L ascom [phone] 04/24/23, 4:33 PM

## 2023-04-24 NOTE — Progress Notes (Signed)
[facility] Liaison Note  Received request from Juan Quam , Transitions of Care Manager, for hospice services at home after discharge.  Spoke with son and daughter in law to initiate education related to hospice philosophy, services, and team approach to care. Family verbalized understanding of information given.    DME needs discussed.  Patient has the following equipment in the home: Home 02, walker and bsc.  Patient/family requests the following equipment in the home: Hospital bed, over the bed table and wheelchair The address has been verified and is correct in the chart.  Eraclio Lord and phone number [phone] is the family contact to arrange time of equipment delivery.    Please send signed and completed DNR home with the patient/family.  Please provide prescriptions at discharge as needed to ensure ongoing symptom management.    [facility] information and contact numbers given to son.  Above information shared with Juan Quam, Transitions of Care Manager.     Please call with any Hospice related questions or concerns.  Thank you for the opportunity to participate in this patient's care.  Redge Gainer, [facility] Liaison [phone]

## 2023-04-24 NOTE — Progress Notes (Signed)
[facility] CLINIC CARDIOLOGY PROGRESS NOTE   Patient ID: [name] MRN: [id number] DOB/AGE: [birth year] 89 y.o.  Admit date: 04/20/2023 Referring Physician Dr Arville Care Primary Physician Lenon Oms, NP Primary Cardiologist Dr. Dorothyann Peng Reason for Consultation AECHF  HPI: [name] is a 89 y.o. male with a past medical history of coronary artery disease, atrial fibrillation, chronic systolic CHF, ventricular tachycardia, AICD in place, hyperlipidemia, type 2 diabetes, bilateral carotid artery stenosis, AAA  who presented to the ED with acute on chronic HFrEF. Patient was recently hospitalized for VT s/p ICD discharge just a few days ago. Cardiology was consulted for further evaluation.   Interval History:  -Patient feeling well this AM without complaints.  -States SOB has continued to improve. Has not walked around much yet but has been OOB.  -BP and HR controlled. Cr mildly improved today.   Review of systems complete and found to be negative unless listed above    Vitals:   04/23/23 2007 04/23/23 2336 04/24/23 0420 04/24/23 0756  BP:  (!) 109/48  110/66  Pulse:  (!) 108  96  Resp:  19  18  Temp: 97.8 F (36.6 C) 97.8 F (36.6 C) 98.4 F (36.9 C) 98.7 F (37.1 C)  TempSrc:  Oral  Axillary  SpO2:  92%  97%  Weight:      Height:         Intake/Output Summary (Last 24 hours) at 04/24/2023 1014 Last data filed at 04/24/2023 0948 Gross per 24 hour  Intake 480 ml  Output 600 ml  Net -120 ml    PHYSICAL EXAM General: Chronically ill-appearing elderly male sitting upright in hospital bed. HEENT: Normocephalic and atraumatic. Neck: No JVD.  Lungs: Normal respiratory effort on baseline O2 requirement. Mild bibasilar crackles.  Heart: HRRR. Normal S1 and S2 without gallops or murmurs. Radial & DP pulses 2+ bilaterally. Abdomen: Non-distended appearing.  Msk: Normal strength and tone for age. Extremities: No clubbing, cyanosis or edema.   Neuro: Alert and  oriented X 3. Psych: Mood appropriate, affect congruent.    LABS: Basic Metabolic Panel: Recent Labs    04/22/23 0800 04/23/23 0918 04/24/23 0508  NA 138 137 134*  K 3.7 3.4* 3.7  CL 101 97* 99  CO2 26 27 25   GLUCOSE 190* 204* 175*  BUN 33* 37* 36*  CREATININE 1.49* 1.53* 1.48*  CALCIUM 8.6* 9.3 8.6*  MG 2.1  --  2.0   Liver Function Tests: No results for input(s): "AST", "ALT", "ALKPHOS", "BILITOT", "PROT", "ALBUMIN" in the last 72 hours.  No results for input(s): "LIPASE", "AMYLASE" in the last 72 hours. CBC: Recent Labs    04/23/23 0918  WBC 11.5*  HGB 14.0  HCT 44.4  MCV 105.7*  PLT 191   Cardiac Enzymes: No results for input(s): "CKTOTAL", "CKMB", "CKMBINDEX", "TROPONINIHS" in the last 72 hours.  BNP: No results for input(s): "BNP" in the last 72 hours.  D-Dimer: No results for input(s): "DDIMER" in the last 72 hours. Hemoglobin A1C: No results for input(s): "HGBA1C" in the last 72 hours. Fasting Lipid Panel: No results for input(s): "CHOL", "HDL", "LDLCALC", "TRIG", "CHOLHDL", "LDLDIRECT" in the last 72 hours. Thyroid Function Tests: No results for input(s): "TSH", "T4TOTAL", "T3FREE", "THYROIDAB" in the last 72 hours.  Invalid input(s): "FREET3"  Anemia Panel: No results for input(s): "VITAMINB12", "FOLATE", "FERRITIN", "TIBC", "IRON", "RETICCTPCT" in the last 72 hours.  No results found.   ECHO  10/2022: 1. The left ventricle is mildly dilated in size  with normal wall thickness.    2. The left ventricular systolic function is severely decreased, LVEF is  visually estimated at 30%.    3. There is thinning and akinesis of the anteroapical and apical segments.    4. Mitral annular calcification is present (mild).    5. The mitral valve leaflets are mildly thickened with normal leaflet  mobility.   6. The aortic valve is probably trileaflet with mildly thickened leaflets  with mildly reduced excursion.    7. There is mild aortic regurgitation.     8. The right ventricle is normal in size, with normal systolic function.   TELEMETRY reviewed by me 04/24/23: not on tele  EKG reviewed by me 04/24/23: normal sinus rhythm with rate of 69 bpm  DATA reviewed by me 04/24/23: last 24h vitals tele labs imaging I/O hospitalist progress note.   ASSESSMENT AND PLAN:  Principal Problem:   Acute on chronic systolic heart failure (HCC) Active Problems:   Type 2 diabetes mellitus with peripheral neuropathy (HCC)   History of ventricular tachycardia   Paroxysmal atrial fibrillation (HCC)   Acute on chronic systolic (congestive) heart failure (HCC)   [name] is a 89 y.o. male  with a past medical history of coronary artery disease, atrial fibrillation, chronic systolic CHF, ventricular tachycardia, AICD in place, hyperlipidemia, type 2 diabetes, bilateral carotid artery stenosis, AAA  who presented to the ED with acute on chronic HFrEF. Cardiology was consulted for further evaluation.      # Acute on chronic HFrEF With most recent EF 30% on echo 10/2022. Has been tolerating GDMT well. BNP elevated on admission to 1100. Troponins 47 > 51 > 44. -Continue home cardiac meds as BP allows.  -Will give lasix 60 mg po today and decrease to 40 tomorrow, this will be his daily home dose. -Strict I's and O's and daily weights.  -Maintain K>4 and Mag>2   # Hx of VT s/p ICD discharge Patient with ICD discharge x1 on 04/16/2023, apparent VT on device interrogation.  -Amiodarone 400 mg bid for 10 days, then taper. Will start 200 mg bid on 10/24.   # Paroxysmal atrial fibrillation Patient with hx of pAF, on metoprolol for rate control at home. Historically not anticoagulated given hx of bleeding and age. -Continue metoprolol succinate 12.5 mg daily for rate control. Amiodarone as above. -Anticoagulation deferred historically.   # AKI  Cr elevated on admission at 1.4, 1.48 today slightly improved from yesterday (baseline ~1.0) -Management per  primary team   # Interstitial lung disease Patient with hx of ILD followed by pulmonology outpatient. On chronic 4-5L supplemental O2 at baseline.  -Management per primary.  Ok for discharge today from a cardiac perspective. Will arrange for follow up in clinic with Dr. Darrold Junker in 1-2 weeks.    This patient's plan of care was discussed and created with Dr. Darrold Junker and he is in agreement.    Signed: Gale Journey, PA-C 04/24/2023, 10:14 AM [facility] Cardiology

## 2023-04-24 NOTE — Plan of Care (Signed)

## 2023-04-24 NOTE — Progress Notes (Signed)
PROGRESS NOTE    [name]  UEA:[id number] DOB: 1928/11/29 DOA: 04/20/2023 PCP: Myrene Buddy, NP    Brief Narrative:  89 y.o. Caucasian male with medical history significant for coronary artery disease, CHF, type 2 diabetes mellitus and peripheral neuropathy, stage IIIa CKD, interstitial pulmonary fibrosis on chronic home O2 at 5 L/min, history of ventricular tachycardia on amiodarone, status post AICD, and atrial fibrillation on Eliquis, who presented to the emergency room with acute onset of dyspnea with associated orthopnea and paroxysmal nocturnal dyspnea as well as dyspnea on exertion.  He admitted to mild dry cough without wheezing.  No lower extremity edema.  No chest pain or palpitations.  No fever or chills.Marland Kitchen  He was recently hospitalized for 3 days with worsening dyspnea.  Today she desatted to 70% on 6 L of O2 by nasal cannula and required 100% nonrebreather and by the time he came to the ER he was placed on 6 L of O2 by nasal cannula.  No nausea or vomiting or abdominal pain.  No bleeding diathesis.  He has not missed any of her medications.  She had her Lasix recently discontinued when they started her on amiodarone.  No fever or chills.   Assessment & Plan:   Principal Problem:   Acute on chronic systolic heart failure (HCC) Active Problems:   Paroxysmal atrial fibrillation (HCC)   Type 2 diabetes mellitus with peripheral neuropathy (HCC)   History of ventricular tachycardia   Acute on chronic systolic (congestive) heart failure (HCC)   Acute on chronic systolic (congestive) heart failure (HCC) Patient has evidence of fluid overload Reports he was taken off his outpatient Lasix when amiodarone was initiated Plan: Lasix switched to p.o. 40 mg daily Target net -1-1.5 L daily Since and outs, daily weights Fluid restrict, low-sodium diet Follow creatinine  Acute kidney injury Unclear etiology.  Could be related to prerenal azotemia in the setting of relative  hypotension.  Also possibly ATN versus cardiorenal syndrome Urinalysis reassuring, no evidence of infection Plan: IV Lasix stopped  Continue P.o. Lasix 40 mg daily Recheck creatinine in a.m. Continue home midodrine  Paroxysmal atrial fibrillation (HCC) Rate controlled currently Continue amiodarone and aspirin Not on anticoagulation for unclear reasons, presumably old age and fall risk  Acute on chronic hypoxic respiratory failure Chronic interstitial lung disease Patient with baseline 5 L requirement Desaturated on 6 L down to 70% This has improved Plan: Diuresis as above Wean oxygen as tolerated currently on home rate   History of ventricular tachycardia PTA amiodarone   Type 2 diabetes mellitus with peripheral neuropathy (HCC) PTA Neurontin NovoLog SSI  Functional decline GOC Patient endorsed acute onset weakness 10/22 after therapy evals Unclear etiology, labs and imaging reassuring Plan: Hospice referral DNR Plan to discharge home with hospice services once DME (hospital bed, wheelchair) delivered.   Will keep in contact with TOC and hospice liaison for discharge readiness  DVT prophylaxis: SQ Lovenox  Code Status: DNR Family Communication: Son via phone 10/20 Disposition Plan: Status is: Inpatient Remains inpatient appropriate because: Acute decompensated heart failure/AKI     Level of care: Progressive  Consultants:  Cardiology-[facility]  Procedures:  None  Antimicrobials: None   Subjective: Seen and examined.  No acute events.  Endorses weakness  Objective: Vitals:   04/24/23 0420 04/24/23 0756 04/24/23 1205 04/24/23 1400  BP:  110/66 111/72 109/68  Pulse:  96 77 60  Resp:  18 20   Temp: 98.4 F (36.9 C) 98.7 F (37.1 C)  98.4 F (36.9 C)   TempSrc:  Axillary Oral   SpO2:  97% 90%   Weight:      Height:        Intake/Output Summary (Last 24 hours) at 04/24/2023 1542 Last data filed at 04/24/2023 0948 Gross per 24 hour  Intake 480 ml   Output 600 ml  Net -120 ml   Filed Weights   04/20/23 1727 04/21/23 0500 04/22/23 0600  Weight: 71.6 kg 69.8 kg 69.9 kg    Examination:  General exam: No acute distress.  Mentating clearly Respiratory system: Bilateral scattered crackles.  Normal work of breathing.  5 L Cardiovascular system: S1-S2, RRR, no murmurs, trace pitting edema BLE Gastrointestinal system: Thin, soft, NT/ND, normal bowel sounds Central nervous system: Alert and oriented. No focal neurological deficits. Extremities: Symmetric 5 x 5 power. Skin: No rashes, lesions or ulcers Psychiatry: Judgement and insight appear normal. Mood & affect appropriate.     Data Reviewed: I have personally reviewed following labs and imaging studies  CBC: Recent Labs  Lab 04/20/23 1813 04/21/23 0342 04/23/23 0918 04/24/23 0508  WBC 12.3* 12.2* 11.5* 11.7*  NEUTROABS  --   --   --  6.9  HGB 13.4 13.2 14.0 12.6*  HCT 43.0 40.6 44.4 38.6*  MCV 107.5* 104.4* 105.7* 104.3*  PLT 158 160 191 191   Basic Metabolic Panel: Recent Labs  Lab 04/20/23 1813 04/21/23 0342 04/22/23 0800 04/23/23 0918 04/24/23 0508  NA 137 138 138 137 134*  K 5.0 3.9 3.7 3.4* 3.7  CL 100 101 101 97* 99  CO2 25 27 26 27 25   GLUCOSE 284* 204* 190* 204* 175*  BUN 32* 30* 33* 37* 36*  CREATININE 1.40* 1.31* 1.49* 1.53* 1.48*  CALCIUM 9.2 8.8* 8.6* 9.3 8.6*  MG 2.2  --  2.1  --  2.0   GFR: Estimated Creatinine Clearance: 31.5 mL/min (A) (by C-G formula based on SCr of 1.48 mg/dL (H)). Liver Function Tests: Recent Labs  Lab 04/20/23 1813  AST 26  ALT 17  ALKPHOS 98  BILITOT 0.9  PROT 8.7*  ALBUMIN 3.8   No results for input(s): "LIPASE", "AMYLASE" in the last 168 hours. No results for input(s): "AMMONIA" in the last 168 hours. Coagulation Profile: No results for input(s): "INR", "PROTIME" in the last 168 hours. Cardiac Enzymes: No results for input(s): "CKTOTAL", "CKMB", "CKMBINDEX", "TROPONINI" in the last 168 hours. BNP (last 3  results) No results for input(s): "PROBNP" in the last 8760 hours. HbA1C: No results for input(s): "HGBA1C" in the last 72 hours. CBG: Recent Labs  Lab 04/24/23 1244  GLUCAP 243*   Lipid Profile: No results for input(s): "CHOL", "HDL", "LDLCALC", "TRIG", "CHOLHDL", "LDLDIRECT" in the last 72 hours. Thyroid Function Tests: No results for input(s): "TSH", "T4TOTAL", "FREET4", "T3FREE", "THYROIDAB" in the last 72 hours.  Anemia Panel: No results for input(s): "VITAMINB12", "FOLATE", "FERRITIN", "TIBC", "IRON", "RETICCTPCT" in the last 72 hours. Sepsis Labs: No results for input(s): "PROCALCITON", "LATICACIDVEN" in the last 168 hours.  No results found for this or any previous visit (from the past 240 hour(s)).       Radiology Studies: No results found.      Scheduled Meds:  amiodarone  400 mg Oral BID   Followed by   Melene Muller ON 04/26/2023] amiodarone  400 mg Oral Daily   Followed by   Melene Muller ON 05/06/2023] amiodarone  200 mg Oral Daily   aspirin EC  81 mg Oral Daily   benzonatate  200  mg Oral TID   brimonidine  1 drop Both Eyes BID   enoxaparin (LOVENOX) injection  40 mg Subcutaneous Q24H   [START ON 04/25/2023] furosemide  40 mg Oral Daily   gabapentin  300 mg Oral BID   loratadine  10 mg Oral Daily   metoprolol succinate  12.5 mg Oral Daily   midodrine  5 mg Oral TID WC   Continuous Infusions:   LOS: 3 days    Tresa Moore, MD [facility]   If 7PM-7AM, please contact night-coverage  04/24/2023, 3:42 PM

## 2023-04-24 NOTE — Progress Notes (Signed)
Physical Therapy Treatment Patient Details Name: [name] MRN: [id number] DOB: [birth year] Today's Date: 04/24/2023   History of Present Illness Pt is a 89 year old male presenting to the ED with cute onset of dyspnea with associated orthopnea and paroxysmal nocturnal dyspnea as well as dyspnea on exertion, admitted with Acute on chronic systolic (congestive) heart failure (HCC), Acute on chronic hypoxic respiratory failure    PMH significant for coronary artery disease, CHF, type 2 diabetes mellitus and peripheral neuropathy, stage IIIa CKD, interstitial pulmonary fibrosis on chronic home O2 at 5 L/min, history of ventricular tachycardia on amiodarone, status post AICD, and atrial fibrillation on Eliquis    PT Comments  Pt was pleasant and motivated to participate during the session and put forth good effort throughout. Pt found supine, finishing up working with OT, pt agreed to continue therapy via OT hand off. Pt able to perform various resisted/non-resisted bed exercises. Provided education on prevention of valsalva when mobilizing or performing exercises, as well as steady breathing throughout exercises. Pt instructed to count out repetitions during exercises in order to further promote optimal breathing techniques. Pt's SpO2 dipped to 88% for ~3 seconds before quickly recovering to >95%. HR remained WFL throughout. Pt will benefit from continued PT services upon discharge to safely address deficits listed in patient problem list for decreased caregiver assistance and eventual return to PLOF.    If plan is discharge home, recommend the following: Assist for transportation;Help with stairs or ramp for entrance;A lot of help with walking and/or transfers;A little help with bathing/dressing/bathroom   Can travel by private vehicle        Equipment Recommendations  None recommended by PT    Recommendations for Other Services       Precautions / Restrictions Precautions Precautions:  Fall Precaution Comments: watch bp, spo2 Restrictions Weight Bearing Restrictions: No     Mobility  Bed Mobility               General bed mobility comments: Pt in bed at start/end of session    Transfers                   General transfer comment: deferred    Ambulation/Gait                   Stairs             Wheelchair Mobility     Tilt Bed    Modified Rankin (Stroke Patients Only)       Balance                                            Cognition Arousal: Alert Behavior During Therapy: WFL for tasks assessed/performed Overall Cognitive Status: Within Functional Limits for tasks assessed                                          Exercises Total Joint Exercises Ankle Circles/Pumps: 10 reps, Both, AROM Quad Sets: Both, 10 reps, AROM Gluteal Sets: Both, 10 reps, AROM Heel Slides: Both, 10 reps, AROM (hand resistance) Hip ABduction/ADduction: Both, 10 reps, AROM Straight Leg Raises: Both, 10 reps, AROM Other Exercises Other Exercises: Consistent education on breathing techniques during exercises to prevent valsalva Other Exercises: Pt educated on  counting out reps of exercises to further assist with steady breathing    General Comments        Pertinent Vitals/Pain Pain Assessment Pain Assessment: No/denies pain    Home Living                          Prior Function            PT Goals (current goals can now be found in the care plan section) Progress towards PT goals: Progressing toward goals    Frequency    Min 1X/week      PT Plan      Co-evaluation              AM-PAC PT "6 Clicks" Mobility   Outcome Measure  Help needed turning from your back to your side while in a flat bed without using bedrails?: None Help needed moving from lying on your back to sitting on the side of a flat bed without using bedrails?: None Help needed moving to and from a  bed to a chair (including a wheelchair)?: A Little Help needed standing up from a chair using your arms (e.g., wheelchair or bedside chair)?: A Little Help needed to walk in hospital room?: A Lot Help needed climbing 3-5 steps with a railing? : A Lot 6 Click Score: 18    End of Session   Activity Tolerance: Patient tolerated treatment well Patient left: in bed;with call bell/phone within reach;with bed alarm set Nurse Communication: Mobility status PT Visit Diagnosis: Other abnormalities of gait and mobility (R26.89);History of falling (Z91.81)     Time: 8469-6295 PT Time Calculation (min) (ACUTE ONLY): 17 min  Charges:                            Cecile Sheerer, SPT 04/24/23, 5:09 PM

## 2023-04-25 DIAGNOSIS — I5023 Acute on chronic systolic (congestive) heart failure: Secondary | ICD-10-CM | POA: Diagnosis not present

## 2023-04-25 LAB — BASIC METABOLIC PANEL
Anion gap: 12 (ref 5–15)
BUN: 36 mg/dL — ABNORMAL HIGH (ref 8–23)
CO2: 26 mmol/L (ref 22–32)
Calcium: 9 mg/dL (ref 8.9–10.3)
Chloride: 98 mmol/L (ref 98–111)
Creatinine, Ser: 1.58 mg/dL — ABNORMAL HIGH (ref 0.61–1.24)
GFR, Estimated: 41 mL/min — ABNORMAL LOW (ref >60)
Glucose, Bld: 185 mg/dL — ABNORMAL HIGH (ref 70–99)
Potassium: 4 mmol/L (ref 3.5–5.1)
Sodium: 136 mmol/L (ref 135–145)

## 2023-04-25 MED ORDER — BENZONATATE 200 MG PO CAPS: 200.0000 mg | ORAL_CAPSULE | Freq: Three times a day (TID) | ORAL | 1 refills | Status: DC | PRN

## 2023-04-25 MED ORDER — AMIODARONE HCL 200 MG PO TABS: ORAL_TABLET | ORAL | 3 refills | Status: DC

## 2023-04-25 MED ORDER — CEPHALEXIN 250 MG PO CAPS: 250.0000 mg | ORAL_CAPSULE | Freq: Every day | ORAL | 0 refills | Status: DC

## 2023-04-25 MED ORDER — FUROSEMIDE 40 MG PO TABS: 40.0000 mg | ORAL_TABLET | Freq: Every day | ORAL | 1 refills | Status: DC

## 2023-04-25 NOTE — Care Management Important Message (Signed)
Important Message  Patient Details  Name: [name] MRN: [id number] Date of Birth: 25-Sep-1928   Important Message Given:  Other (see comment) (patient going home with [facility] hospice services)     Darolyn Rua, LCSW 04/25/2023, 9:07 AM

## 2023-04-25 NOTE — Discharge Summary (Signed)
[name] ZOX:[id number] DOB: [birth year] DOA: 04/20/2023  PCP: Myrene Buddy, NP  Admit date: 04/20/2023 Discharge date: 04/25/2023  Time spent: 35 minutes  Recommendations for Outpatient Follow-up:  Pcp and cardiology f/u Bmp 1 week     Discharge Diagnoses:  Principal Problem:   Acute on chronic systolic heart failure [facility]) Active Problems:   Paroxysmal atrial fibrillation (HCC)   Type 2 diabetes mellitus with peripheral neuropathy (HCC)   History of ventricular tachycardia   Acute on chronic systolic (congestive) heart failure (HCC)   Discharge Condition: stable  Diet recommendation: heart healthy  Filed Weights   04/21/23 0500 04/22/23 0600 04/25/23 0436  Weight: 69.8 kg 69.9 kg 67.4 kg    History of present illness:  From admission h and p [name] is a 89 y.o. Caucasian male with medical history significant for coronary artery disease, CHF, type 2 diabetes mellitus and peripheral neuropathy, stage IIIa CKD, interstitial pulmonary fibrosis on chronic home O2 at 5 L/min, history of ventricular tachycardia on amiodarone, status post AICD, and atrial fibrillation on Eliquis, who presented to the emergency room with acute onset of dyspnea with associated orthopnea and paroxysmal nocturnal dyspnea as well as dyspnea on exertion.  He admitted to mild dry cough without wheezing.  No lower extremity edema.  No chest pain or palpitations.  No fever or chills.Marland Kitchen  He was recently hospitalized for 3 days with worsening dyspnea.  Today she desatted to 70% on 6 L of O2 by nasal cannula and required 100% nonrebreather and by the time he came to the ER he was placed on 6 L of O2 by nasal cannula.  No nausea or vomiting or abdominal pain.  No bleeding diathesis.  He has not missed any of her medications.  She had her Lasix recently discontinued when they started her on amiodarone.  No fever or chills.    Hospital Course:   Acute on chronic systolic (congestive) heart  failure (HCC) Patient has evidence of fluid overload Reports he was taken off his outpatient Lasix when amiodarone was initiated Cardiology consulted Diuresed here, lasix re-started Outpatient cardiology f/u 1-2 weeks  Paroxysmal atrial fibrillation (HCC) Rate controlled currently Continue amiodarone taper and then maintenance   Acute on chronic hypoxic respiratory failure Chronic interstitial lung disease Patient with baseline 5 L requirement Desaturated on 6 L down to 70% This has improved, now stable on home o2  History of ventricular tachycardia Recent admit for VT s/p ICD discharge - amio taper and maintenance as above  Suprapubic catheter Patient complaining of suprapubic discomfort. Started on keflex by prior hospitalist, no urinalysis or culture ordered - complete course keflex   Type 2 diabetes mellitus with peripheral neuropathy (HCC) PTA Neurontin NovoLog SSI   Functional decline Palliative and hospice engaged DME and HH ordered Hospice will follow as outpatient. For now he wants to "treat the treatable"  Procedures: none   Consultations: cardiology  Discharge Exam: Vitals:   04/25/23 0401 04/25/23 0814  BP: 114/65 107/64  Pulse: 60 60  Resp: 20 19  Temp: 97.8 F (36.6 C) 98.1 F (36.7 C)  SpO2: 97% 99%    General: NAD Cardiovascular: RRR systolic murmur Respiratory: scattered rales, normal wob Ext: warm, no edema  Discharge Instructions   Discharge Instructions     Diet - low sodium heart healthy   Complete by: As directed    Increase activity slowly   Complete by: As directed       Allergies as of  04/25/2023       Reactions   Sulfamethoxazole-trimethoprim Other (See Comments)        Medication List     TAKE these medications    acetaminophen 500 MG tablet Commonly known as: TYLENOL Take 1 tablet (500 mg total) by mouth daily as needed.   albuterol 108 (90 Base) MCG/ACT inhaler Commonly known as: VENTOLIN HFA Inhale 2  puffs into the lungs every 6 (six) hours as needed for wheezing or shortness of breath. Use 2 times daily x5 days, then every 6 hours as needed.   amiodarone 200 MG tablet Commonly known as: Pacerone Take 1 tab twice a day for 10 days followed by 1 tab daily What changed: See the new instructions.   aspirin EC 81 MG tablet Take 81 mg by mouth daily.   benzonatate 200 MG capsule Commonly known as: TESSALON Take 1 capsule (200 mg total) by mouth 3 (three) times daily as needed for cough.   brimonidine 0.2 % ophthalmic solution Commonly known as: ALPHAGAN Place 1 drop into both eyes 2 (two) times daily.   cephALEXin 250 MG capsule Commonly known as: KEFLEX Take 1 capsule (250 mg total) by mouth daily at 12 noon.   cetirizine 10 MG tablet Commonly known as: ZYRTEC Take 10 mg by mouth daily.   furosemide 40 MG tablet Commonly known as: LASIX Take 1 tablet (40 mg total) by mouth daily. Start taking on: April 26, 2023   gabapentin 300 MG capsule Commonly known as: NEURONTIN Take 300 mg by mouth 2 (two) times daily.   metoprolol succinate 25 MG 24 hr tablet Commonly known as: TOPROL-XL Take 12.5 mg by mouth daily.   midodrine 5 MG tablet Commonly known as: PROAMATINE Take 1 tablet (5 mg total) by mouth 3 (three) times daily with meals.               Durable Medical Equipment  (From admission, onward)           Start     Ordered   04/24/23 1513  For home use only DME Hospital bed  Once       Question Answer Comment  Length of Need Lifetime   The above medical condition requires: Patient requires the ability to reposition frequently   Bed type Semi-electric   Support Surface: Gel Overlay      04/24/23 1512   04/24/23 1418  For home use only DME lightweight manual wheelchair with seat cushion  Once       Comments: Patient suffers from weakness, functional decline which impairs their ability to perform daily activities like bathing, dressing, feeding,  grooming, and toileting in the home.  A cane, crutch, or walker will not resolve  issue with performing activities of daily living. A wheelchair will allow patient to safely perform daily activities. Patient is not able to propel themselves in the home using a standard weight wheelchair due to arm weakness, endurance, and general weakness. Patient can self propel in the lightweight wheelchair. Length of need Lifetime. Accessories: elevating leg rests (ELRs), wheel locks, extensions and anti-tippers.   04/24/23 1417           Allergies  Allergen Reactions   Sulfamethoxazole-Trimethoprim Other (See Comments)    Follow-up Information     Alwyn Pea, MD. Go to.   Specialties: Cardiology, Internal Medicine Why: Patient scheduled for 10/25 at 11 AM with Dr. Juliann Pares. Also scheduled for heart failure clinic on 10/29 at 1 PM. Contact information: [address] [place] [address] [phone]         Bayard Males, Hermenia Fiscal, NP Follow up.   Specialty: Internal Medicine Contact information: [address] [name] [address] [phone]                  The results of significant diagnostics from this hospitalization (including imaging, microbiology, ancillary and laboratory) are listed below for reference.    Significant Diagnostic Studies: DG Chest Port 1 View  Result Date: 04/24/2023 CLINICAL DATA:  Shortness of breath. Acute on chronic heart failure. EXAM: PORTABLE CHEST 1 VIEW COMPARISON:  04/20/2023 FINDINGS: Left-sided pacemaker in place. Stable cardiomegaly. Persistent bilateral interstitial opacities, left greater than right, however slight improvement from prior exam. Suspect small pleural effusions. No pneumothorax. IMPRESSION: Persistent bilateral interstitial opacities, left greater than right, however slight improvement from prior exam. Findings may represent pulmonary edema or multifocal pneumonia. Electronically Signed   By: Narda Rutherford  M.D.   On: 04/24/2023 18:02   DG Chest 2 View  Result Date: 04/20/2023 CLINICAL DATA:  Shortness of breath. EXAM: CHEST - 2 VIEW COMPARISON:  04/15/2023. FINDINGS: Since the prior study, there are increased interstitial markings throughout bilateral lungs. No dense consolidation or lung collapse. Bilateral costophrenic angles are clear. Stable cardio-mediastinal silhouette. There is a left sided 2-lead pacemaker. No acute osseous abnormalities. The soft tissues are within normal limits. IMPRESSION: 1. Increased interstitial markings throughout bilateral lungs, most in keeping with pulmonary edema. Differential diagnosis also includes ARDS and multilobar pneumonia, which are less likely in the given clinical context. Electronically Signed   By: Jules Schick M.D.   On: 04/20/2023 18:46   DG Chest Port 1 View  Result Date: 04/16/2023 CLINICAL DATA:  Defibrillator shock EXAM: PORTABLE CHEST 1 VIEW COMPARISON:  Chest x-ray 04/24/2022 FINDINGS: The heart is enlarged, unchanged. Left-sided pacemaker is again seen. There central pulmonary vascular congestion. Right perihilar nodular density measuring 13 mm again noted. There scattered multifocal areas of interstitial opacities in the mid and lower lungs similar to the prior study. There is no new focal lung infiltrate, pleural effusion or pneumothorax. No acute fractures are seen. IMPRESSION: 1. Cardiomegaly with central pulmonary vascular congestion. 2. Unchanged right perihilar nodular density measuring 13 mm. 3. Stable chronic interstitial lung disease. Electronically Signed   By: Darliss Cheney M.D.   On: 04/16/2023 00:00    Microbiology: No results found for this or any previous visit (from the past 240 hour(s)).   Labs: Basic Metabolic Panel: Recent Labs  Lab 04/20/23 1813 04/21/23 0342 04/22/23 0800 04/23/23 0918 04/24/23 0508 04/25/23 0416  NA 137 138 138 137 134* 136  K 5.0 3.9 3.7 3.4* 3.7 4.0  CL 100 101 101 97* 99 98  CO2 25 27 26 27  25 26   GLUCOSE 284* 204* 190* 204* 175* 185*  BUN 32* 30* 33* 37* 36* 36*  CREATININE 1.40* 1.31* 1.49* 1.53* 1.48* 1.58*  CALCIUM 9.2 8.8* 8.6* 9.3 8.6* 9.0  MG 2.2  --  2.1  --  2.0  --    Liver Function Tests: Recent Labs  Lab 04/20/23 1813  AST 26  ALT 17  ALKPHOS 98  BILITOT 0.9  PROT 8.7*  ALBUMIN 3.8   No results for input(s): "LIPASE", "AMYLASE" in the last 168 hours. No results for input(s): "AMMONIA" in the last 168 hours. CBC: Recent Labs  Lab 04/20/23 1813 04/21/23 0342 04/23/23 0918 04/24/23 0508  WBC 12.3* 12.2* 11.5* 11.7*  NEUTROABS  --   --   --  6.9  HGB 13.4 13.2 14.0 12.6*  HCT 43.0 40.6 44.4 38.6*  MCV 107.5* 104.4* 105.7* 104.3*  PLT 158 160 191 191   Cardiac Enzymes: No results for input(s): "CKTOTAL", "CKMB", "CKMBINDEX", "TROPONINI" in the last 168 hours. BNP: BNP (last 3 results) Recent Labs    10/11/22 1601 04/20/23 1813  BNP 618.1* 1,117.9*    ProBNP (last 3 results) No results for input(s): "PROBNP" in the last 8760 hours.  CBG: Recent Labs  Lab 04/24/23 1244  GLUCAP 243*       Signed:  Silvano Bilis MD.  [facility] 04/25/2023, 9:27 AM

## 2023-04-25 NOTE — Progress Notes (Signed)
Patient discharging home via EMS. All personal belongings including DC paperwork and DNR sent with pt. Son called and informed of pt departure

## 2023-04-25 NOTE — TOC Transition Note (Signed)
Transition of Care [facility]) - CM/SW Discharge Note   Patient Details  Name: [name] MRN: [id number] Date of Birth: 1929/03/07  Transition of Care [facility]) CM/SW Contact:  Darolyn Rua, LCSW Phone Number: 04/25/2023, 9:40 AM   Clinical Narrative:     Patient to discharge home with [facility], family reports wanting ems after dme deliver around 11 today. They will call CSW when to arrange EMS, EMS forms on chart. Hospice liaison aware. No further discharge needs.   Final next level of care: Home w Hospice Care Barriers to Discharge: No Barriers Identified   Patient Goals and CMS Choice CMS Medicare.gov Compare Post Acute Care list provided to:: Patient Choice offered to / list presented to : Patient  Discharge Placement                         Discharge Plan and Services Additional resources added to the After Visit Summary for                                       Social Determinants of Health (SDOH) Interventions SDOH Screenings   Food Insecurity: No Food Insecurity (04/20/2023)  Housing: Low Risk  (04/20/2023)  Transportation Needs: No Transportation Needs (04/20/2023)  Utilities: Not At Risk (04/20/2023)  Financial Resource Strain: Low Risk  (10/05/2022)   Received from [facility]  Tobacco Use: Low Risk  (04/20/2023)     Readmission Risk Interventions    10/26/2020   11:25 AM  Readmission Risk Prevention Plan  Transportation Screening Complete  PCP or Specialist Appt within 3-5 Days Complete  HRI or Home Care Consult Complete  Social Work Consult for Recovery Care Planning/Counseling Complete  Palliative Care Screening Not Applicable  Medication Review Oceanographer) Complete

## 2023-07-02 ENCOUNTER — Other Ambulatory Visit: Payer: Self-pay | Admitting: Obstetrics and Gynecology

## 2023-10-02 DEATH — deceased
